# Patient Record
Sex: Male | Born: 1945 | Race: White | Hispanic: No | Marital: Single | Smoking: Former smoker
Health system: Southern US, Community
[De-identification: ages and names within clinical notes are randomized; demographics above are authoritative.]

## PROBLEM LIST (undated history)

## (undated) DIAGNOSIS — I509 Heart failure, unspecified: Secondary | ICD-10-CM

## (undated) DIAGNOSIS — I2510 Atherosclerotic heart disease of native coronary artery without angina pectoris: Secondary | ICD-10-CM

## (undated) DIAGNOSIS — N400 Enlarged prostate without lower urinary tract symptoms: Secondary | ICD-10-CM

## (undated) DIAGNOSIS — I219 Acute myocardial infarction, unspecified: Secondary | ICD-10-CM

## (undated) DIAGNOSIS — M1990 Unspecified osteoarthritis, unspecified site: Secondary | ICD-10-CM

## (undated) DIAGNOSIS — I77810 Thoracic aortic ectasia: Secondary | ICD-10-CM

## (undated) DIAGNOSIS — Z95828 Presence of other vascular implants and grafts: Secondary | ICD-10-CM

## (undated) DIAGNOSIS — J449 Chronic obstructive pulmonary disease, unspecified: Secondary | ICD-10-CM

## (undated) DIAGNOSIS — M109 Gout, unspecified: Secondary | ICD-10-CM

## (undated) DIAGNOSIS — I10 Essential (primary) hypertension: Secondary | ICD-10-CM

## (undated) DIAGNOSIS — I5042 Chronic combined systolic (congestive) and diastolic (congestive) heart failure: Secondary | ICD-10-CM

## (undated) HISTORY — PX: FRACTURE SURGERY: SHX138

## (undated) HISTORY — PX: HERNIA REPAIR: SHX51

## (undated) HISTORY — DX: Thoracic aortic ectasia: I77.810

## (undated) HISTORY — PX: OTHER SURGICAL HISTORY: SHX169

---

## 2014-07-20 ENCOUNTER — Observation Stay (HOSPITAL_COMMUNITY)
Admission: EM | Admit: 2014-07-20 | Discharge: 2014-07-24 | Disposition: A | Discharge status: Home / Self Care | Payer: Medicare HMO | Attending: Family Medicine | Admitting: Family Medicine

## 2014-07-20 ENCOUNTER — Emergency Department (HOSPITAL_COMMUNITY): Payer: Medicare HMO

## 2014-07-20 ENCOUNTER — Encounter (HOSPITAL_COMMUNITY): Payer: Self-pay | Admitting: *Deleted

## 2014-07-20 DIAGNOSIS — I10 Essential (primary) hypertension: Secondary | ICD-10-CM | POA: Insufficient documentation

## 2014-07-20 DIAGNOSIS — Z87891 Personal history of nicotine dependence: Secondary | ICD-10-CM | POA: Diagnosis not present

## 2014-07-20 DIAGNOSIS — I509 Heart failure, unspecified: Secondary | ICD-10-CM | POA: Insufficient documentation

## 2014-07-20 DIAGNOSIS — I252 Old myocardial infarction: Secondary | ICD-10-CM | POA: Insufficient documentation

## 2014-07-20 DIAGNOSIS — R42 Dizziness and giddiness: Secondary | ICD-10-CM | POA: Insufficient documentation

## 2014-07-20 DIAGNOSIS — I2510 Atherosclerotic heart disease of native coronary artery without angina pectoris: Secondary | ICD-10-CM | POA: Diagnosis not present

## 2014-07-20 DIAGNOSIS — Z79899 Other long term (current) drug therapy: Secondary | ICD-10-CM | POA: Insufficient documentation

## 2014-07-20 DIAGNOSIS — R509 Fever, unspecified: Secondary | ICD-10-CM

## 2014-07-20 DIAGNOSIS — H538 Other visual disturbances: Secondary | ICD-10-CM | POA: Insufficient documentation

## 2014-07-20 DIAGNOSIS — M1990 Unspecified osteoarthritis, unspecified site: Secondary | ICD-10-CM | POA: Diagnosis not present

## 2014-07-20 DIAGNOSIS — M542 Cervicalgia: Secondary | ICD-10-CM | POA: Insufficient documentation

## 2014-07-20 DIAGNOSIS — R110 Nausea: Secondary | ICD-10-CM | POA: Insufficient documentation

## 2014-07-20 DIAGNOSIS — R51 Headache: Secondary | ICD-10-CM | POA: Insufficient documentation

## 2014-07-20 DIAGNOSIS — Z880 Allergy status to penicillin: Secondary | ICD-10-CM | POA: Insufficient documentation

## 2014-07-20 DIAGNOSIS — Z7982 Long term (current) use of aspirin: Secondary | ICD-10-CM | POA: Insufficient documentation

## 2014-07-20 DIAGNOSIS — R739 Hyperglycemia, unspecified: Secondary | ICD-10-CM

## 2014-07-20 DIAGNOSIS — N179 Acute kidney failure, unspecified: Secondary | ICD-10-CM | POA: Diagnosis not present

## 2014-07-20 DIAGNOSIS — J441 Chronic obstructive pulmonary disease with (acute) exacerbation: Secondary | ICD-10-CM | POA: Insufficient documentation

## 2014-07-20 DIAGNOSIS — R079 Chest pain, unspecified: Principal | ICD-10-CM | POA: Insufficient documentation

## 2014-07-20 DIAGNOSIS — N170 Acute kidney failure with tubular necrosis: Secondary | ICD-10-CM

## 2014-07-20 HISTORY — DX: Heart failure, unspecified: I50.9

## 2014-07-20 HISTORY — DX: Unspecified osteoarthritis, unspecified site: M19.90

## 2014-07-20 HISTORY — DX: Atherosclerotic heart disease of native coronary artery without angina pectoris: I25.10

## 2014-07-20 HISTORY — DX: Acute myocardial infarction, unspecified: I21.9

## 2014-07-20 HISTORY — DX: Essential (primary) hypertension: I10

## 2014-07-20 HISTORY — DX: Presence of other vascular implants and grafts: Z95.828

## 2014-07-20 HISTORY — DX: Chronic obstructive pulmonary disease, unspecified: J44.9

## 2014-07-20 LAB — URINALYSIS, ROUTINE W REFLEX MICROSCOPIC
Bilirubin Urine: NEGATIVE
Glucose, UA: NEGATIVE mg/dL
Hgb urine dipstick: NEGATIVE
Ketones, ur: NEGATIVE mg/dL
LEUKOCYTES UA: NEGATIVE
Nitrite: NEGATIVE
PROTEIN: NEGATIVE mg/dL
Specific Gravity, Urine: 1.02 (ref 1.005–1.030)
UROBILINOGEN UA: 0.2 mg/dL (ref 0.0–1.0)
pH: 7 (ref 5.0–8.0)

## 2014-07-20 LAB — RAPID URINE DRUG SCREEN, HOSP PERFORMED
AMPHETAMINES: NOT DETECTED
BENZODIAZEPINES: NOT DETECTED
Barbiturates: NOT DETECTED
Cocaine: NOT DETECTED
Opiates: NOT DETECTED
Tetrahydrocannabinol: NOT DETECTED

## 2014-07-20 LAB — COMPREHENSIVE METABOLIC PANEL
ALT: 29 U/L (ref 17–63)
ANION GAP: 9 (ref 5–15)
AST: 46 U/L — ABNORMAL HIGH (ref 15–41)
Albumin: 3.9 g/dL (ref 3.5–5.0)
Alkaline Phosphatase: 58 U/L (ref 38–126)
BUN: 26 mg/dL — AB (ref 6–20)
CALCIUM: 8.7 mg/dL — AB (ref 8.9–10.3)
CO2: 23 mmol/L (ref 22–32)
Chloride: 105 mmol/L (ref 101–111)
Creatinine, Ser: 1.63 mg/dL — ABNORMAL HIGH (ref 0.61–1.24)
GFR calc non Af Amer: 42 mL/min — ABNORMAL LOW (ref >60)
GFR, EST AFRICAN AMERICAN: 49 mL/min — AB (ref >60)
GLUCOSE: 115 mg/dL — AB (ref 65–99)
POTASSIUM: 3.9 mmol/L (ref 3.5–5.1)
Sodium: 137 mmol/L (ref 135–145)
Total Bilirubin: 0.6 mg/dL (ref 0.3–1.2)
Total Protein: 7.3 g/dL (ref 6.5–8.1)

## 2014-07-20 LAB — CBC WITH DIFFERENTIAL/PLATELET
BASOS ABS: 0 10*3/uL (ref 0.0–0.1)
Basophils Relative: 0 % (ref 0–1)
Eosinophils Absolute: 0 10*3/uL (ref 0.0–0.7)
Eosinophils Relative: 1 % (ref 0–5)
HCT: 37.4 % — ABNORMAL LOW (ref 39.0–52.0)
Hemoglobin: 13 g/dL (ref 13.0–17.0)
LYMPHS PCT: 20 % (ref 12–46)
Lymphs Abs: 0.4 10*3/uL — ABNORMAL LOW (ref 0.7–4.0)
MCH: 29.8 pg (ref 26.0–34.0)
MCHC: 34.8 g/dL (ref 30.0–36.0)
MCV: 85.8 fL (ref 78.0–100.0)
MONO ABS: 0.2 10*3/uL (ref 0.1–1.0)
Monocytes Relative: 10 % (ref 3–12)
NEUTROS PCT: 69 % (ref 43–77)
Neutro Abs: 1.5 10*3/uL — ABNORMAL LOW (ref 1.7–7.7)
PLATELETS: 123 10*3/uL — AB (ref 150–400)
RBC: 4.36 MIL/uL (ref 4.22–5.81)
RDW: 12.9 % (ref 11.5–15.5)
WBC: 2.2 10*3/uL — AB (ref 4.0–10.5)

## 2014-07-20 LAB — TROPONIN I

## 2014-07-20 MED ORDER — BETAMETHASONE VALERATE 0.1 % EX LOTN
TOPICAL_LOTION | Freq: Two times a day (BID) | CUTANEOUS | Status: DC
  Administered 2014-07-20 – 2014-07-23 (×6): via TOPICAL
  Administered 2014-07-23: 1 via TOPICAL
  Administered 2014-07-24: 12:00:00 via TOPICAL
  Filled 2014-07-20: qty 60

## 2014-07-20 MED ORDER — ALBUTEROL SULFATE (2.5 MG/3ML) 0.083% IN NEBU
2.5000 mg | INHALATION_SOLUTION | Freq: Four times a day (QID) | RESPIRATORY_TRACT | Status: DC | PRN
  Administered 2014-07-21: 2.5 mg via RESPIRATORY_TRACT
  Filled 2014-07-20: qty 3

## 2014-07-20 MED ORDER — TIOTROPIUM BROMIDE MONOHYDRATE 18 MCG IN CAPS
18.0000 ug | ORAL_CAPSULE | Freq: Every day | RESPIRATORY_TRACT | Status: DC
  Administered 2014-07-21 – 2014-07-24 (×4): 18 ug via RESPIRATORY_TRACT
  Filled 2014-07-20 (×2): qty 5

## 2014-07-20 MED ORDER — NITROGLYCERIN 0.4 MG SL SUBL
0.4000 mg | SUBLINGUAL_TABLET | Freq: Once | SUBLINGUAL | Status: AC
  Administered 2014-07-20: 0.4 mg via SUBLINGUAL
  Filled 2014-07-20: qty 1

## 2014-07-20 NOTE — H&P (Signed)
Phillip Pace is an 69 y.o. male.    [place] (pcp)  Chief Complaint: chest pain HPI: 69 yo male with CAD, CHF (EF ?,  Pt denies this diagnosis), apparently c/o chills, subjective fever for the past 2-3 days.  Denies cough, dysuria, hematuria, diarrhea.  Pt admits to tick bite.  Pt has been cutting brush.  Pt admits to chest pain intermittently.  But this is not new.  Chest pain usually in the middle, with radiation to the left arm.  Usually lasting about 2-3 minutes.  Pt states that last episode of chest pain was last nite.  Pt will be admitted for subjective fever and chest pain and sob.    Past Medical History  Diagnosis Date  . MI (myocardial infarction)     x 5  . Presence of stent in artery     13 stents  . CHF (congestive heart failure)   . Arthritis   . COPD (chronic obstructive pulmonary disease)   . Coronary artery disease   . Hypertension     Past Surgical History  Procedure Laterality Date  . Hernia repair    . Fracture surgery      collar bone  . Stents 13      Family History  Problem Relation Age of Onset  . Cancer - Colon Mother   . CAD Mother    Social History:  reports that he quit smoking about 21 years ago. He does not have any smokeless tobacco history on file. He reports that he does not drink alcohol or use illicit drugs.  Allergies:  Allergies  Allergen Reactions  . Penicillins Shortness Of Breath, Itching and Swelling  . Shellfish Allergy Itching and Swelling   Medications reviewed   Results for orders placed or performed during the hospital encounter of 07/20/14 (from the past 48 hour(s))  CBC with Differential     Status: Abnormal   Collection Time: 07/20/14  8:40 PM  Result Value Ref Range   WBC 2.2 (L) 4.0 - 10.5 K/uL   RBC 4.36 4.22 - 5.81 MIL/uL   Hemoglobin 13.0 13.0 - 17.0 g/dL   HCT 37.4 (L) 39.0 - 52.0 %   MCV 85.8 78.0 - 100.0 fL   MCH 29.8 26.0 - 34.0 pg   MCHC 34.8 30.0 - 36.0 g/dL   RDW 12.9 11.5 - 15.5 %   Platelets 123  (L) 150 - 400 K/uL   Neutrophils Relative % 69 43 - 77 %   Neutro Abs 1.5 (L) 1.7 - 7.7 K/uL   Lymphocytes Relative 20 12 - 46 %   Lymphs Abs 0.4 (L) 0.7 - 4.0 K/uL   Monocytes Relative 10 3 - 12 %   Monocytes Absolute 0.2 0.1 - 1.0 K/uL   Eosinophils Relative 1 0 - 5 %   Eosinophils Absolute 0.0 0.0 - 0.7 K/uL   Basophils Relative 0 0 - 1 %   Basophils Absolute 0.0 0.0 - 0.1 K/uL  Troponin I     Status: None   Collection Time: 07/20/14  8:40 PM  Result Value Ref Range   Troponin I <0.03 <0.031 ng/mL    Comment:        NO INDICATION OF MYOCARDIAL INJURY.   Comprehensive metabolic panel     Status: Abnormal   Collection Time: 07/20/14  8:40 PM  Result Value Ref Range   Sodium 137 135 - 145 mmol/L   Potassium 3.9 3.5 - 5.1 mmol/L   Chloride 105 101 -  111 mmol/L   CO2 23 22 - 32 mmol/L   Glucose, Bld 115 (H) 65 - 99 mg/dL   BUN 26 (H) 6 - 20 mg/dL   Creatinine, Ser 1.63 (H) 0.61 - 1.24 mg/dL   Calcium 8.7 (L) 8.9 - 10.3 mg/dL   Total Protein 7.3 6.5 - 8.1 g/dL   Albumin 3.9 3.5 - 5.0 g/dL   AST 46 (H) 15 - 41 U/L   ALT 29 17 - 63 U/L   Alkaline Phosphatase 58 38 - 126 U/L   Total Bilirubin 0.6 0.3 - 1.2 mg/dL   GFR calc non Af Amer 42 (L) >60 mL/min   GFR calc Af Amer 49 (L) >60 mL/min    Comment: (NOTE) The eGFR has been calculated using the CKD EPI equation. This calculation has not been validated in all clinical situations. eGFR's persistently <60 mL/min signify possible Chronic Kidney Disease.    Anion gap 9 5 - 15  Urine rapid drug screen (hosp performed)     Status: None   Collection Time: 07/20/14  9:26 PM  Result Value Ref Range   Opiates NONE DETECTED NONE DETECTED   Cocaine NONE DETECTED NONE DETECTED   Benzodiazepines NONE DETECTED NONE DETECTED   Amphetamines NONE DETECTED NONE DETECTED   Tetrahydrocannabinol NONE DETECTED NONE DETECTED   Barbiturates NONE DETECTED NONE DETECTED    Comment:        DRUG SCREEN FOR MEDICAL PURPOSES ONLY.  IF CONFIRMATION  IS NEEDED FOR ANY PURPOSE, NOTIFY LAB WITHIN 5 DAYS.        LOWEST DETECTABLE LIMITS FOR URINE DRUG SCREEN Drug Class       Cutoff (ng/mL) Amphetamine      1000 Barbiturate      200 Benzodiazepine   443 Tricyclics       154 Opiates          300 Cocaine          300 THC              50   Urinalysis, Routine w reflex microscopic (not at [facility])     Status: None   Collection Time: 07/20/14  9:26 PM  Result Value Ref Range   Color, Urine YELLOW YELLOW   APPearance CLEAR CLEAR   Specific Gravity, Urine 1.020 1.005 - 1.030   pH 7.0 5.0 - 8.0   Glucose, UA NEGATIVE NEGATIVE mg/dL   Hgb urine dipstick NEGATIVE NEGATIVE   Bilirubin Urine NEGATIVE NEGATIVE   Ketones, ur NEGATIVE NEGATIVE mg/dL   Protein, ur NEGATIVE NEGATIVE mg/dL   Urobilinogen, UA 0.2 0.0 - 1.0 mg/dL   Nitrite NEGATIVE NEGATIVE   Leukocytes, UA NEGATIVE NEGATIVE    Comment: MICROSCOPIC NOT DONE ON URINES WITH NEGATIVE PROTEIN, BLOOD, LEUKOCYTES, NITRITE, OR GLUCOSE <1000 mg/dL.   Dg Chest 2 View  07/20/2014   CLINICAL DATA:  Left-sided chest pain, shortness of breath, and chills.  EXAM: CHEST  2 VIEW  COMPARISON:  None.  FINDINGS: Slight interstitial pattern to the lung bases, suggesting mild fibrosis or chronic bronchitic change. No evidence of focal consolidation or airspace disease. No blunting of costophrenic angles. No pneumothorax. Normal heart size and pulmonary vascularity. Mediastinal contours are intact.  IMPRESSION: No active cardiopulmonary disease.   Electronically Signed   By: Lucienne Capers M.D.   On: 07/20/2014 21:13    Review of Systems  Constitutional: Positive for fever and chills. Negative for weight loss, malaise/fatigue and diaphoresis.  HENT: Negative.   Eyes: Negative.  Respiratory: Positive for shortness of breath. Negative for cough, hemoptysis, sputum production and wheezing.   Cardiovascular: Positive for chest pain. Negative for palpitations, orthopnea, claudication, leg swelling and  PND.  Gastrointestinal: Negative for heartburn, nausea, vomiting, abdominal pain, diarrhea, constipation, blood in stool and melena.  Genitourinary: Negative for dysuria, urgency, frequency, hematuria and flank pain.  Musculoskeletal: Negative for myalgias, back pain, joint pain, falls and neck pain.  Skin: Negative.   Neurological: Negative.  Negative for weakness.  Endo/Heme/Allergies: Negative.   Psychiatric/Behavioral: Negative.     Blood pressure 121/57, pulse 82, temperature 98.5 F (36.9 C), temperature source Oral, resp. rate 24, height 6' 2" (1.88 m), weight 83.915 kg (185 lb), SpO2 96 %. Physical Exam  Constitutional: He is oriented to person, place, and time. He appears well-developed and well-nourished.  HENT:  Head: Normocephalic and atraumatic.  Mouth/Throat: No oropharyngeal exudate.  Eyes: Conjunctivae and EOM are normal. Pupils are equal, round, and reactive to light. No scleral icterus.  Neck: Normal range of motion. Neck supple. No JVD present. No tracheal deviation present. No thyromegaly present.  Cardiovascular: Normal rate and regular rhythm.  Exam reveals no gallop and no friction rub.   No murmur heard. Respiratory: Effort normal and breath sounds normal. No respiratory distress. He has no wheezes. He has no rales.  GI: Soft. Bowel sounds are normal. He exhibits no distension. There is no tenderness. There is no rebound and no guarding.  Musculoskeletal: Normal range of motion. He exhibits no edema or tenderness.  Lymphadenopathy:    He has no cervical adenopathy.  Neurological: He is alert and oriented to person, place, and time. He has normal reflexes. He displays normal reflexes. No cranial nerve deficit. He exhibits normal muscle tone. Coordination normal.  Skin: Skin is warm and dry. No rash noted. No erythema. No pallor.  Psychiatric: He has a normal mood and affect. His behavior is normal. Judgment and thought content normal.      Assessment/Plan Fever Check lyme, and check ehlichiosis titer Blood culture x2 Check esr  ARF Check ua  Check  Urine sodium, urine creatinine, urine eosinophils Hydrate with ns iv  Chest pain Tele Trop i q6h x3 NPO after md Nuclear stress test  Hyperglycemia Check hga1c  DVt prophylaxis:  Scd, lovenox   Jani Gravel 07/20/2014, 10:49 PM

## 2014-07-20 NOTE — ED Notes (Signed)
Pt. Reports intermittent dizziness and chest pain. Pt. Reports that the chest pain gets worse when he lays on his left side. Pt. Reports shortness of breath with ambulation. No distress noted.

## 2014-07-20 NOTE — ED Notes (Addendum)
Pt c/o chest pain. When asked how long he has been having chest pain, he states "oh, it happens all the time." Pt also c/o chills, generalized bodyaches and fever x 2 days and dizziness x 2 months. SOB x 2 years. Pt has not seen PCP. Pt has multiple complaints.

## 2014-07-20 NOTE — ED Provider Notes (Signed)
CSN: [id number]     Arrival date & time 07/20/14  1950 History  This chart was scribed for Davonna Belling, MD by Eustaquio Maize, ED Scribe. This patient was seen in room APA14/[facility]14 and the patient's care was started at 8:15 PM.  Chief Complaint  Patient presents with  . Chest Pain   The history is provided by the patient. No language interpreter was used.     HPI Comments: Phillip Pace is a 69 y.o. male with hx CAD s/p stents, multiple MIs, CHF, COPD, and HTN who presents to the Emergency Department complaining of intermittent chest pain that has been on going for some time. He also complains of increasing shortness of breath, chills, fever of 101.5, headache, blurry vision, and dizziness. He has been having fever x 1 week. Pt states that he gets dizzy and short of breath upon walking. Pt also complains of left sided neck pain and left arm pain that occurs while having chest pain. Pt also gets nauseous when the chest pain occurs. Pt saw Dr. Cindie Laroche approximately 2 weeks ago and was prescribed nitroglycerin. He has follow up appointment in 3 weeks. Denies cough, rash, dysuria, hematuria, vomiting, diarrhea, speech difficulty, numbness or weakness in extremities, or any other symptoms. Pt is former smoker who quit approximately 25 years ago.    Past Medical History  Diagnosis Date  . MI (myocardial infarction)     x 5  . Presence of stent in artery     13 stents  . CHF (congestive heart failure)   . Arthritis   . COPD (chronic obstructive pulmonary disease)   . Coronary artery disease   . Hypertension    Past Surgical History  Procedure Laterality Date  . Hernia repair    . Fracture surgery      collar bone  . Stents 13     Family History  Problem Relation Age of Onset  . Cancer - Colon Mother   . CAD Mother    History  Substance Use Topics  . Smoking status: Former Smoker    Quit date: 02/02/1993  . Smokeless tobacco: Not on file  . Alcohol Use: No    Review of  Systems  Constitutional: Positive for fever and chills.  HENT: Negative for congestion, rhinorrhea and sore throat.   Eyes: Positive for visual disturbance.  Respiratory: Positive for shortness of breath. Negative for cough.   Cardiovascular: Positive for chest pain.  Gastrointestinal: Positive for nausea. Negative for vomiting and diarrhea.  Genitourinary: Negative for dysuria and hematuria.  Musculoskeletal: Positive for arthralgias (Left arm. ) and neck pain.  Skin: Negative for rash.  Neurological: Positive for dizziness and headaches. Negative for speech difficulty, weakness and numbness.      Allergies  Penicillins and Shellfish allergy  Home Medications   Prior to Admission medications   Medication Sig Start Date End Date Taking? Authorizing Provider  aspirin 325 MG EC tablet Take 325 mg by mouth every evening.   Yes Historical Provider, MD  Aspirin-Caffeine (ANACIN) 400-32 MG TABS Take 1-2 tablets by mouth daily as needed (for pain).   Yes Historical Provider, MD  fenofibrate micronized (LOFIBRA) 134 MG capsule Take 134 mg by mouth every evening.   Yes Historical Provider, MD  ibuprofen (ADVIL,MOTRIN) 600 MG tablet Take 600 mg by mouth every 6 (six) hours as needed for mild pain or moderate pain.   Yes Historical Provider, MD  lisinopril (PRINIVIL,ZESTRIL) 5 MG tablet Take 5 mg by mouth every evening.  Yes Historical Provider, MD  niacin (SLO-NIACIN) 500 MG tablet Take 500 mg by mouth at bedtime.   Yes Historical Provider, MD  NITROSTAT 0.4 MG SL tablet Take one tablet by mouth as needed for chest pain. Repeat in 5 minutes if no relief for up to 3 total doses as needed for chest pain. 07/06/14  Yes Historical Provider, MD   Triage Vitals: BP 149/80 mmHg  Pulse 85  Temp(Src) 99.5 F (37.5 C) (Oral)  Resp 20  Ht 6\' 2"  (1.88 m)  Wt 185 lb (83.915 kg)  BMI 23.74 kg/m2  SpO2 100%   Physical Exam  Constitutional: He is oriented to person, place, and time. He appears  well-developed and well-nourished. No distress.  HENT:  Head: Normocephalic and atraumatic.  Eyes: Conjunctivae and EOM are normal.  Neck: Neck supple. No JVD present. No tracheal deviation present.  Cardiovascular: Normal rate, regular rhythm and normal heart sounds.   Pulmonary/Chest: Effort normal. No respiratory distress. He has rales.  Few scattered crackles on right mid lung field.   Musculoskeletal: Normal range of motion. He exhibits no edema.  Neurological: He is alert and oriented to person, place, and time.  Skin: Skin is warm and dry.  Psychiatric: He has a normal mood and affect. His behavior is normal.  Nursing note and vitals reviewed.   ED Course  Procedures (including critical care time)  DIAGNOSTIC STUDIES: Oxygen Saturation is 100% on RA, normal by my interpretation.    COORDINATION OF CARE: 8:25 PM-Discussed treatment plan which includes CXR, UA, CBC, Troponin, CMP, Drug screen with pt at bedside and pt agreed to plan.   Labs Review Labs Reviewed  CBC WITH DIFFERENTIAL/PLATELET - Abnormal; Notable for the following:    WBC 2.2 (*)    HCT 37.4 (*)    Platelets 123 (*)    Neutro Abs 1.5 (*)    Lymphs Abs 0.4 (*)    All other components within normal limits  COMPREHENSIVE METABOLIC PANEL - Abnormal; Notable for the following:    Glucose, Bld 115 (*)    BUN 26 (*)    Creatinine, Ser 1.63 (*)    Calcium 8.7 (*)    AST 46 (*)    GFR calc non Af Amer 42 (*)    GFR calc Af Amer 49 (*)    All other components within normal limits  TROPONIN I  URINE RAPID DRUG SCREEN, HOSP PERFORMED  URINALYSIS, ROUTINE W REFLEX MICROSCOPIC (NOT AT [facility])  CBC  COMPREHENSIVE METABOLIC PANEL    Imaging Review Dg Chest 2 View  07/20/2014   CLINICAL DATA:  Left-sided chest pain, shortness of breath, and chills.  EXAM: CHEST  2 VIEW  COMPARISON:  None.  FINDINGS: Slight interstitial pattern to the lung bases, suggesting mild fibrosis or chronic bronchitic change. No evidence of  focal consolidation or airspace disease. No blunting of costophrenic angles. No pneumothorax. Normal heart size and pulmonary vascularity. Mediastinal contours are intact.  IMPRESSION: No active cardiopulmonary disease.   Electronically Signed   By: Lucienne Capers M.D.   On: 07/20/2014 21:13     EKG Interpretation   Date/Time:  Friday July 20 2014 19:59:10 EDT Ventricular Rate:  89 PR Interval:  162 QRS Duration: 94 QT Interval:  366 QTC Calculation: 445 R Axis:   9 Text Interpretation:  Normal sinus rhythm Nonspecific ST abnormality  Abnormal ECG No old tracing to compare Confirmed by Alvino Chapel  MD, Larina Lieurance  [id number]) on 07/20/2014 8:08:07 PM  MDM   Final diagnoses:  Chest pain, unspecified chest pain type  Acute renal failure, unspecified acute renal failure type   patient presents with episodes of chest pain shortness of breath. Come both with exertion and at rest. It is not necessarily associated with the exertion. States he has had previous coronary disease did set feels somewhat like this. Also has had fevers and chills. She's had occasional cough. No dysuria. He just started to see a new doctor after moving to town from [facility] EKG has nonspecific changes but on all these are new or old. Pain feels somewhat better in the ER. X-ray reassuring although were somewhat focal lung findings on exam. Urine does not show infection. Will admit to internal medicine.  I personally performed the services described in this documentation, which was scribed in my presence. The recorded information has been reviewed and is accurate.       Davonna Belling, MD 07/20/14 [phone]

## 2014-07-21 ENCOUNTER — Inpatient Hospital Stay (HOSPITAL_BASED_OUTPATIENT_CLINIC_OR_DEPARTMENT_OTHER): Payer: Medicare HMO

## 2014-07-21 ENCOUNTER — Encounter (HOSPITAL_COMMUNITY): Payer: Self-pay | Admitting: *Deleted

## 2014-07-21 DIAGNOSIS — H538 Other visual disturbances: Secondary | ICD-10-CM | POA: Diagnosis not present

## 2014-07-21 DIAGNOSIS — N179 Acute kidney failure, unspecified: Secondary | ICD-10-CM | POA: Diagnosis not present

## 2014-07-21 DIAGNOSIS — R509 Fever, unspecified: Secondary | ICD-10-CM | POA: Diagnosis not present

## 2014-07-21 DIAGNOSIS — R079 Chest pain, unspecified: Secondary | ICD-10-CM

## 2014-07-21 LAB — COMPREHENSIVE METABOLIC PANEL
ALBUMIN: 3.6 g/dL (ref 3.5–5.0)
ALT: 28 U/L (ref 17–63)
ANION GAP: 8 (ref 5–15)
AST: 38 U/L (ref 15–41)
Alkaline Phosphatase: 55 U/L (ref 38–126)
BUN: 26 mg/dL — ABNORMAL HIGH (ref 6–20)
CHLORIDE: 105 mmol/L (ref 101–111)
CO2: 24 mmol/L (ref 22–32)
Calcium: 8.5 mg/dL — ABNORMAL LOW (ref 8.9–10.3)
Creatinine, Ser: 1.5 mg/dL — ABNORMAL HIGH (ref 0.61–1.24)
GFR calc Af Amer: 54 mL/min — ABNORMAL LOW (ref >60)
GFR calc non Af Amer: 46 mL/min — ABNORMAL LOW (ref >60)
Glucose, Bld: 106 mg/dL — ABNORMAL HIGH (ref 65–99)
Potassium: 4.1 mmol/L (ref 3.5–5.1)
SODIUM: 137 mmol/L (ref 135–145)
Total Bilirubin: 0.8 mg/dL (ref 0.3–1.2)
Total Protein: 6.5 g/dL (ref 6.5–8.1)

## 2014-07-21 LAB — CBC
HCT: 37.2 % — ABNORMAL LOW (ref 39.0–52.0)
Hemoglobin: 12.5 g/dL — ABNORMAL LOW (ref 13.0–17.0)
MCH: 28.9 pg (ref 26.0–34.0)
MCHC: 33.6 g/dL (ref 30.0–36.0)
MCV: 86.1 fL (ref 78.0–100.0)
PLATELETS: 113 10*3/uL — AB (ref 150–400)
RBC: 4.32 MIL/uL (ref 4.22–5.81)
RDW: 12.9 % (ref 11.5–15.5)
WBC: 2.2 10*3/uL — AB (ref 4.0–10.5)

## 2014-07-21 LAB — LIPID PANEL
CHOLESTEROL: 178 mg/dL (ref 0–200)
HDL: 33 mg/dL — ABNORMAL LOW (ref >40)
LDL CALC: 113 mg/dL — AB (ref 0–99)
TRIGLYCERIDES: 158 mg/dL — AB (ref <150)
Total CHOL/HDL Ratio: 5.4 RATIO
VLDL: 32 mg/dL (ref 0–40)

## 2014-07-21 LAB — TROPONIN I

## 2014-07-21 MED ORDER — ACETAMINOPHEN 325 MG PO TABS
650.0000 mg | ORAL_TABLET | Freq: Four times a day (QID) | ORAL | Status: DC | PRN
  Administered 2014-07-21 (×3): 650 mg via ORAL
  Filled 2014-07-21 (×3): qty 2

## 2014-07-21 MED ORDER — FENOFIBRATE 54 MG PO TABS
54.0000 mg | ORAL_TABLET | Freq: Every day | ORAL | Status: DC
  Administered 2014-07-21 – 2014-07-24 (×4): 54 mg via ORAL
  Filled 2014-07-21 (×5): qty 1

## 2014-07-21 MED ORDER — NIACIN ER 250 MG PO CPCR
ORAL_CAPSULE | ORAL | Status: AC
  Filled 2014-07-21: qty 2

## 2014-07-21 MED ORDER — LISINOPRIL 5 MG PO TABS
5.0000 mg | ORAL_TABLET | Freq: Every evening | ORAL | Status: DC
  Administered 2014-07-21 – 2014-07-24 (×4): 5 mg via ORAL
  Filled 2014-07-21 (×4): qty 1

## 2014-07-21 MED ORDER — ENOXAPARIN SODIUM 40 MG/0.4ML ~~LOC~~ SOLN
40.0000 mg | SUBCUTANEOUS | Status: DC
  Administered 2014-07-21 – 2014-07-22 (×2): 40 mg via SUBCUTANEOUS
  Filled 2014-07-21 (×3): qty 0.4

## 2014-07-21 MED ORDER — DOXYCYCLINE HYCLATE 100 MG PO TABS
100.0000 mg | ORAL_TABLET | Freq: Two times a day (BID) | ORAL | Status: DC
  Administered 2014-07-21 – 2014-07-24 (×8): 100 mg via ORAL
  Filled 2014-07-21 (×8): qty 1

## 2014-07-21 MED ORDER — ACETAMINOPHEN 650 MG RE SUPP: 650.0000 mg | Freq: Four times a day (QID) | RECTAL | Status: DC | PRN

## 2014-07-21 MED ORDER — NIACIN ER 500 MG PO TBCR
500.0000 mg | EXTENDED_RELEASE_TABLET | Freq: Every day | ORAL | Status: DC
  Administered 2014-07-21: 500 mg via ORAL
  Filled 2014-07-21 (×3): qty 1

## 2014-07-21 MED ORDER — SODIUM CHLORIDE 0.9 % IV SOLN
INTRAVENOUS | Status: AC
  Administered 2014-07-21: 01:00:00 via INTRAVENOUS

## 2014-07-21 MED ORDER — BETAMETHASONE VALERATE 0.1 % EX LOTN
TOPICAL_LOTION | CUTANEOUS | Status: AC
  Filled 2014-07-21: qty 60

## 2014-07-21 MED ORDER — SODIUM CHLORIDE 0.9 % IJ SOLN
3.0000 mL | Freq: Two times a day (BID) | INTRAMUSCULAR | Status: DC
  Administered 2014-07-21 – 2014-07-24 (×6): 3 mL via INTRAVENOUS
  Administered 2014-07-24: 10 mL via INTRAVENOUS

## 2014-07-21 MED ORDER — ASPIRIN EC 325 MG PO TBEC
325.0000 mg | DELAYED_RELEASE_TABLET | Freq: Every evening | ORAL | Status: DC
  Administered 2014-07-21 – 2014-07-24 (×4): 325 mg via ORAL
  Filled 2014-07-21 (×4): qty 1

## 2014-07-21 MED ORDER — NITROGLYCERIN 0.4 MG SL SUBL
0.4000 mg | SUBLINGUAL_TABLET | SUBLINGUAL | Status: DC | PRN
  Administered 2014-07-21: 0.4 mg via SUBLINGUAL
  Filled 2014-07-21: qty 1

## 2014-07-21 MED ORDER — NIACIN ER 250 MG PO CPCR
500.0000 mg | ORAL_CAPSULE | Freq: Every day | ORAL | Status: DC
  Administered 2014-07-21 – 2014-07-23 (×3): 500 mg via ORAL
  Filled 2014-07-21 (×4): qty 2

## 2014-07-21 NOTE — Progress Notes (Signed)
Subjective:   Objective: Vital signs in last 24 hours: Temp:  [98.2 F (36.8 C)-99.5 F (37.5 C)] 98.4 F (36.9 C) (06/18 1259) Pulse Rate:  [71-86] 86 (06/18 1259) Resp:  [17-24] 20 (06/18 1259) BP: (112-149)/(55-84) 124/84 mmHg (06/18 1259) SpO2:  [95 %-100 %] 98 % (06/18 1259) Weight:  [83.9 kg (184 lb 15.5 oz)-83.915 kg (185 lb)] 83.9 kg (184 lb 15.5 oz) (06/18 0500) Weight change:  Last BM Date: 07/20/14  Intake/Output from previous day: 06/17 0701 - 06/18 0700 In: 245 [I.V.:245] Out: 500 [Urine:500] Intake/Output this shift: Total I/O In: 480 [P.O.:480] Out: 525 [Urine:525]  Physical Exam:    Recent Labs  07/20/14 2040 07/21/14 0618  WBC 2.2* 2.2*  HGB 13.0 12.5*  HCT 37.4* 37.2*  PLT 123* 113*   BMET  Recent Labs  07/20/14 2040 07/21/14 0618  NA 137 137  K 3.9 4.1  CL 105 105  CO2 23 24  GLUCOSE 115* 106*  BUN 26* 26*  CREATININE 1.63* 1.50*  CALCIUM 8.7* 8.5*    Studies/Results: Dg Chest 2 View  07/20/2014   CLINICAL DATA:  Left-sided chest pain, shortness of breath, and chills.  EXAM: CHEST  2 VIEW  COMPARISON:  None.  FINDINGS: Slight interstitial pattern to the lung bases, suggesting mild fibrosis or chronic bronchitic change. No evidence of focal consolidation or airspace disease. No blunting of costophrenic angles. No pneumothorax. Normal heart size and pulmonary vascularity. Mediastinal contours are intact.  IMPRESSION: No active cardiopulmonary disease.   Electronically Signed   By: Lucienne Capers M.D.   On: 07/20/2014 21:13    Medications:  . aspirin  325 mg Oral QPM  . betamethasone valerate lotion   Topical BID  . doxycycline  100 mg Oral Q12H  . enoxaparin (LOVENOX) injection  40 mg Subcutaneous Q24H  . fenofibrate  54 mg Oral Daily  . lisinopril  5 mg Oral QPM  . niacin  500 mg Oral QHS  . sodium chloride  3 mL Intravenous Q12H  . tiotropium  18 mcg Inhalation Daily        Assessment/Plan:    LOS: 1 day    Tami Barren G 07/21/2014, 3:15 PM

## 2014-07-21 NOTE — Progress Notes (Signed)
  Echocardiogram 2D Echocardiogram has been performed.  Lysle Rubens 07/21/2014, 2:59 PM

## 2014-07-21 NOTE — Progress Notes (Signed)
Subjective: The patient is alert and oriented and free of pain. He does have prior history of coronary artery disease with previous MI and multiple stent placement. 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 He was placed on IV Vibramycin and currently is afebrile. He was given nitroglycerin which did relieve his chest pain. His troponins have been normal  Objective: Vital signs in last 24 hours: Temp:  [98.2 F (36.8 C)-99.5 F (37.5 C)] 98.4 F (36.9 C) (06/18 1259) Pulse Rate:  [71-86] 86 (06/18 1259) Resp:  [17-24] 20 (06/18 1259) BP: (112-149)/(55-84) 124/84 mmHg (06/18 1259) SpO2:  [95 %-100 %] 98 % (06/18 1259) Weight:  [83.9 kg (184 lb 15.5 oz)-83.915 kg (185 lb)] 83.9 kg (184 lb 15.5 oz) (06/18 0500) Weight change:  Last BM Date: 07/20/14  Intake/Output from previous day: 06/17 0701 - 06/18 0700 In: 245 [I.V.:245] Out: 500 [Urine:500] Intake/Output this shift: Total I/O In: 480 [P.O.:480] Out: 525 [Urine:525]  Physical Exam: General appearance patient is alert and oriented  HEENT negative  Neck supple no JVD or thyroid abnormalities  Lungs clear to P&A  Heart regular rhythm no murmurs  Abdomen no palpable organs or masses  Skin warm and dry no rash noted   Recent Labs  07/20/14 2040 07/21/14 0618  WBC 2.2* 2.2*  HGB 13.0 12.5*  HCT 37.4* 37.2*  PLT 123* 113*   BMET  Recent Labs  07/20/14 2040 07/21/14 0618  NA 137 137  K 3.9 4.1  CL 105 105  CO2 23 24  GLUCOSE 115* 106*  BUN 26* 26*  CREATININE 1.63* 1.50*  CALCIUM 8.7* 8.5*    Studies/Results: Dg Chest 2 View  07/20/2014   CLINICAL DATA:  Left-sided chest pain, shortness of breath, and chills.  EXAM: CHEST  2 VIEW  COMPARISON:  None.  FINDINGS: Slight interstitial pattern to the lung bases, suggesting mild fibrosis or chronic bronchitic change. No evidence of focal consolidation or airspace disease. No blunting of costophrenic angles. No pneumothorax. Normal heart size and pulmonary vascularity.  Mediastinal contours are intact.  IMPRESSION: No active cardiopulmonary disease.   Electronically Signed   By: Lucienne Capers M.D.   On: 07/20/2014 21:13    Medications:  . aspirin  325 mg Oral QPM  . betamethasone valerate lotion   Topical BID  . doxycycline  100 mg Oral Q12H  . enoxaparin (LOVENOX) injection  40 mg Subcutaneous Q24H  . fenofibrate  54 mg Oral Daily  . lisinopril  5 mg Oral QPM  . niacin  500 mg Oral QHS  . sodium chloride  3 mL Intravenous Q12H  . tiotropium  18 mcg Inhalation Daily        Assessment/Plan: 1. Patient had multiple tick bites and fever awaiting cultures to be completed-continue Vibramycin IV  2. Chest pain-history of coronary artery disease with multiple stent placement-plan to continue to monitor troponin will repeat EKG-we'll continue when necessary Nitrostat and daily aspirin 325 mg   LOS: 1 day   Neetu Carrozza G 07/21/2014, 3:24 PM

## 2014-07-22 DIAGNOSIS — N179 Acute kidney failure, unspecified: Secondary | ICD-10-CM | POA: Diagnosis not present

## 2014-07-22 DIAGNOSIS — R509 Fever, unspecified: Secondary | ICD-10-CM | POA: Diagnosis not present

## 2014-07-22 DIAGNOSIS — R079 Chest pain, unspecified: Secondary | ICD-10-CM | POA: Diagnosis not present

## 2014-07-22 DIAGNOSIS — H538 Other visual disturbances: Secondary | ICD-10-CM | POA: Diagnosis not present

## 2014-07-22 NOTE — Progress Notes (Signed)
Subjective: The patient is alert and oriented free of pain. He did have episode of chest pain last evening which responded to nitroglycerin. He is afebrile. He does have a history of coronary artery disease with previous MI and multiple stent placements. Because of multiple tick bites and low-grade fever he was started on IV Vibramycin and is afebrile. His troponins remain normal  Objective: Vital signs in last 24 hours: Temp:  [98.1 F (36.7 C)-98.6 F (37 C)] 98.1 F (36.7 C) (06/19 0635) Pulse Rate:  [67-86] 77 (06/19 0635) Resp:  [14-20] 20 (06/19 0635) BP: (124-147)/(68-84) 140/68 mmHg (06/19 0635) SpO2:  [95 %-99 %] 96 % (06/19 0635) Weight:  [85.594 kg (188 lb 11.2 oz)] 85.594 kg (188 lb 11.2 oz) (06/19 0635) Weight change: 1.678 kg (3 lb 11.2 oz) Last BM Date: 07/20/14  Intake/Output from previous day: 06/18 0701 - 06/19 0700 In: 720 [P.O.:720] Out: 1825 [Urine:1825] Intake/Output this shift: Total I/O In: -  Out: 900 [Urine:900]  Physical Exam: Gen. appearance the patient is alert and oriented  H EENT negative  Neck supple no JVD or thyroid abnormalities  Lungs clear to P&A  Heart regular rhythm no murmurs  Abdomen no palpable organs or masses  Skin warm and dry no rash noted   Recent Labs  07/20/14 2040 07/21/14 0618  WBC 2.2* 2.2*  HGB 13.0 12.5*  HCT 37.4* 37.2*  PLT 123* 113*   BMET  Recent Labs  07/20/14 2040 07/21/14 0618  NA 137 137  K 3.9 4.1  CL 105 105  CO2 23 24  GLUCOSE 115* 106*  BUN 26* 26*  CREATININE 1.63* 1.50*  CALCIUM 8.7* 8.5*    Studies/Results: Dg Chest 2 View  07/20/2014   CLINICAL DATA:  Left-sided chest pain, shortness of breath, and chills.  EXAM: CHEST  2 VIEW  COMPARISON:  None.  FINDINGS: Slight interstitial pattern to the lung bases, suggesting mild fibrosis or chronic bronchitic change. No evidence of focal consolidation or airspace disease. No blunting of costophrenic angles. No pneumothorax. Normal heart  size and pulmonary vascularity. Mediastinal contours are intact.  IMPRESSION: No active cardiopulmonary disease.   Electronically Signed   By: Lucienne Capers M.D.   On: 07/20/2014 21:13    Medications:  . aspirin  325 mg Oral QPM  . betamethasone valerate lotion   Topical BID  . doxycycline  100 mg Oral Q12H  . enoxaparin (LOVENOX) injection  40 mg Subcutaneous Q24H  . fenofibrate  54 mg Oral Daily  . lisinopril  5 mg Oral QPM  . niacin  500 mg Oral QHS  . sodium chloride  3 mL Intravenous Q12H  . tiotropium  18 mcg Inhalation Daily        Assessment/Plan: 1. Patient had multiple tick bites and fever-awaiting cultures to be completed continue IV Vibramycin  2. Chest pain-prior history of coronary artery disease with multiple stent placements-continue daily aspirin tabs Nitrostat as needed patient's troponins have been normal EKG minor ST segment changes   LOS: 2 days   Nimrat Woolworth G 07/22/2014, 6:56 AM

## 2014-07-23 DIAGNOSIS — R079 Chest pain, unspecified: Secondary | ICD-10-CM

## 2014-07-23 LAB — EHRLICHIA ANTIBODY PANEL
E chaffeensis (HGE) Ab, IgG: NEGATIVE
E chaffeensis (HGE) Ab, IgM: NEGATIVE
E. CHAFFEENSIS (HME) IGM TITER: NEGATIVE
E.Chaffeensis (HME) IgG: NEGATIVE

## 2014-07-23 LAB — B. BURGDORFI ANTIBODIES: B burgdorferi Ab IgG+IgM: <0.91 {ISR} (ref 0.00–0.90)

## 2014-07-23 MED ORDER — NIACIN ER 250 MG PO CPCR
ORAL_CAPSULE | ORAL | Status: AC
  Filled 2014-07-23: qty 2

## 2014-07-23 MED ORDER — METOPROLOL SUCCINATE ER 25 MG PO TB24
12.5000 mg | ORAL_TABLET | Freq: Every day | ORAL | Status: DC
  Administered 2014-07-23 – 2014-07-24 (×2): 12.5 mg via ORAL
  Filled 2014-07-23 (×2): qty 1

## 2014-07-23 NOTE — Progress Notes (Signed)
[name] a total of 13 cardiac stents old records not exactly available with anginal type chest pain none today has Cardiolite scheduled for tomorrow patient will be kept inpatient due to the high probability this is ischemic pain due to his multiple interventions Phillip Pace ZRA:[id number] DOB: 12-22-1945 DOA: 07/20/2014 PCP: No primary care provider on file.             Physical Exam: Blood pressure 136/71, pulse 56, temperature 97.6 F (36.4 C), temperature source Oral, resp. rate 20, height 6\' 2"  (1.88 m), weight 189 lb 8 oz (85.957 kg), SpO2 97 %. lungs clear to A&P no rales wheeze rhonchi heart regular rhythm no murmurs goes rubs abdomen soft nontender bowel sounds normoactive no guarding or rebound masses no megaly extremities no clubbing cyanosis or edema   Investigations:  No results found for this or any previous visit (from the past 240 hour(s)).   Basic Metabolic Panel:  Recent Labs  07/20/14 2040 07/21/14 0618  NA 137 137  K 3.9 4.1  CL 105 105  CO2 23 24  GLUCOSE 115* 106*  BUN 26* 26*  CREATININE 1.63* 1.50*  CALCIUM 8.7* 8.5*   Liver Function Tests:  Recent Labs  07/20/14 2040 07/21/14 0618  AST 46* 38  ALT 29 28  ALKPHOS 58 55  BILITOT 0.6 0.8  PROT 7.3 6.5  ALBUMIN 3.9 3.6     CBC:  Recent Labs  07/20/14 2040 07/21/14 0618  WBC 2.2* 2.2*  NEUTROABS 1.5*  --   HGB 13.0 12.5*  HCT 37.4* 37.2*  MCV 85.8 86.1  PLT 123* 113*    No results found.    Medications:   Impression:  Active Problems:   Chest pain   ARF (acute renal failure)   Hyperglycemia   Fever     Plan: For Cardiolite functional evaluation in a.m. we'll await to see any reversible ischemia and then decide upon increased medical therapy versus intervention  Consultants: Cardiology   Procedures   Antibiotics: Doxycycline                  Code Status:   Family Communication:  Spoke with patient and wife  Disposition Plan see plan  above  Time spent: 30 minutes   LOS: 3 days   Brittanie Dosanjh M   07/23/2014, 12:43 PM

## 2014-07-23 NOTE — Consult Note (Addendum)
Consulting cardiologist: Dr Carlyle Dolly MD  Clinical Summary Phillip Pace is a 69 y.o.male history of CAD with prior stents (all cardiac care done at various hospitals in [place], [place]), COPD, HTN admitted with chest pain. Chest off and on for several years. This episode different from prior episodes. On Friday had sharp pain midchest, started while pushing lawnmower. Mild dizziness. + SOB. Worst with deep breaths. Pain lasted < 1 minute, pain resolved with rest. Repeat episode of burning chest pain that lasted approximately 15 minutes. Better with NG. Notes some DOE that is variable, can walk up to a mile on some days and then can get SOB wit short distances on some days. . Last stress test 2 years, he is unsure of results.   He also reports subjective fevers and chills, diffuse joint pains over the last several days. Evaluate by primary team, currently on antibiotics.   Allergies  Allergen Reactions  . Penicillins Shortness Of Breath, Itching and Swelling  . Shellfish Allergy Itching and Swelling    Medications Scheduled Medications: . aspirin  325 mg Oral QPM  . betamethasone valerate lotion   Topical BID  . doxycycline  100 mg Oral Q12H  . enoxaparin (LOVENOX) injection  40 mg Subcutaneous Q24H  . fenofibrate  54 mg Oral Daily  . lisinopril  5 mg Oral QPM  . niacin  500 mg Oral QHS  . sodium chloride  3 mL Intravenous Q12H  . tiotropium  18 mcg Inhalation Daily     Infusions:     PRN Medications:  acetaminophen **OR** acetaminophen, albuterol, nitroGLYCERIN   Past Medical History  Diagnosis Date  . MI (myocardial infarction)     x 5  . Presence of stent in artery     13 stents  . CHF (congestive heart failure)   . Arthritis   . COPD (chronic obstructive pulmonary disease)   . Coronary artery disease   . Hypertension     Past Surgical History  Procedure Laterality Date  . Hernia repair    . Fracture surgery      collar bone  . Stents 13       Family History  Problem Relation Age of Onset  . Cancer - Colon Mother   . CAD Mother     Social History [name] reports that he quit smoking about 21 years ago. He does not have any smokeless tobacco history on file. [name] reports that he does not drink alcohol.  Review of Systems CONSTITUTIONAL: + fevers, chills HEENT: Eyes: No visual loss, blurred vision, double vision or yellow sclerae. No hearing loss, sneezing, congestion, runny nose or sore throat.  SKIN: No rash or itching.  CARDIOVASCULAR: per HPI RESPIRATORY: No shortness of breath, cough or sputum.  GASTROINTESTINAL: No anorexia, nausea, vomiting or diarrhea. No abdominal pain or blood.  GENITOURINARY: no polyuria, no dysuria NEUROLOGICAL: No headache, dizziness, syncope, paralysis, ataxia, numbness or tingling in the extremities. No change in bowel or bladder control.  MUSCULOSKELETAL: No muscle, back pain, joint pain or stiffness.  HEMATOLOGIC: No anemia, bleeding or bruising.  LYMPHATICS: No enlarged nodes. No history of splenectomy.  PSYCHIATRIC: No history of depression or anxiety.      Physical Examination Blood pressure 136/71, pulse 56, temperature 97.6 F (36.4 C), temperature source Oral, resp. rate 20, height 6\' 2"  (1.88 m), weight 189 lb 8 oz (85.957 kg), SpO2 98 %.  Intake/Output Summary (Last 24 hours) at 07/23/14 0854 Last data filed at  07/23/14 0755  Gross per 24 hour  Intake   1200 ml  Output    875 ml  Net    325 ml    HEENT: sclera clear  Cardiovascular: RRR, no m/r/g, no JVD  Respiratory: CTAB  GI: abdomen soft, NT, ND  MSK: no LE edema  Neuro: no focal deficits  Psych: appropriate affect   Lab Results  Basic Metabolic Panel:  Recent Labs Lab 07/20/14 2040 07/21/14 0618  NA 137 137  K 3.9 4.1  CL 105 105  CO2 23 24  GLUCOSE 115* 106*  BUN 26* 26*  CREATININE 1.63* 1.50*  CALCIUM 8.7* 8.5*    Liver Function Tests:  Recent Labs Lab 07/20/14 2040  07/21/14 0618  AST 46* 38  ALT 29 28  ALKPHOS 58 55  BILITOT 0.6 0.8  PROT 7.3 6.5  ALBUMIN 3.9 3.6    CBC:  Recent Labs Lab 07/20/14 2040 07/21/14 0618  WBC 2.2* 2.2*  NEUTROABS 1.5*  --   HGB 13.0 12.5*  HCT 37.4* 37.2*  MCV 85.8 86.1  PLT 123* 113*    Cardiac Enzymes:  Recent Labs Lab 07/20/14 2040 07/21/14 0035 07/21/14 0618 07/21/14 1301  TROPONINI <0.03 <0.03 <0.03 <0.03    BNP: Invalid input(s): POCBNP   ECG   Imaging   Impression/Recommendations 1. Chest pain - self reported extensive cardiac history, reports 13 stents placed at various hospitals in [place], [place]. He does not remember the name of his prior cardiologist or his clinic, however does remember an interventionalist Dr Jeni Salles at [facility] who did some of his stents, will request records - mixed cardiac symptoms in setting of non-specific febrile illness w/ diffuse joint pains. On doxy emperically for possible tick borne disease, workup per primary team - trop neg x 4, EKG no ischemic changes - echo LVEF 40-45% with hypokinesis of the anteroseptal and inferoseptal walls, unknown baseline.  - agree patient needs stress test. He ate breakfast this AM, ok for discharge today and for exercise cardiolite tomorrow - change ASA to 81mg  daily, start Toprol XL 12.5mg  daily in setting of systolic dysfunction and as antianginal (I have instructed him not take until after outpatient stress test tomorrow), f/u records to look into statin history (he is on niacin and fibrate only)      Carlyle Dolly, M.D.

## 2014-07-23 NOTE — Clinical Documentation Improvement (Signed)
Presents with CP and multiple tick bites, fever.   Patient with history of CHF  ECHO reveals EF of 40-45% with hypokinesis of anteroseptal and inferoseptal myocardium  Being treated with PO ASA and Zestril  Please provide a diagnosis associated with the above clinical indicators and document findings in next progress note and include in discharge summary if applicable.  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

## 2014-07-23 NOTE — Care Management Note (Signed)
Case Management Note  Patient Details  Name: Phillip Pace MRN: [id number] Date of Birth: 12-10-1945  Expected Discharge Date:  07/23/14               Expected Discharge Plan:  Home/Self Care  In-House Referral:  NA  Discharge planning Services  CM Consult  Post Acute Care Choice:  NA Choice offered to:  NA  DME Arranged:    DME Agency:     HH Arranged:    [place] Agency:     Status of Service:  Completed, signed off  Medicare Important Message Given:    Date Medicare IM Given:    Medicare IM give by:    Date Additional Medicare IM Given:    Additional Medicare Important Message give by:     If discussed at [place] of Stay Meetings, dates discussed:    Additional Comments: Pt is from home, lives with girlfriend. Pt is independent with ADL's. Pt has a cane to use if needed. Pt plans to discharge home with self care. No CM needs.  Sherald Barge, RN 07/23/2014, 1:26 PM

## 2014-07-24 ENCOUNTER — Observation Stay (HOSPITAL_COMMUNITY): Payer: Medicare HMO

## 2014-07-24 ENCOUNTER — Inpatient Hospital Stay (HOSPITAL_COMMUNITY): Admit: 2014-07-24 | Payer: Medicare HMO

## 2014-07-24 DIAGNOSIS — R079 Chest pain, unspecified: Secondary | ICD-10-CM | POA: Diagnosis not present

## 2014-07-24 LAB — NM MYOCAR MULTI W/SPECT W/WALL MOTION / EF
CHL CUP MPHR: 153 {beats}/min
CHL CUP NUCLEAR SDS: 0
CSEPEW: 7 METS
CSEPHR: 80 %
CSEPPHR: 123 {beats}/min
Exercise duration (min): 4 min
Exercise duration (sec): 32 s
LHR: 0.3
LV dias vol: 87 mL
LV sys vol: 43 mL
RPE: 19
Rest HR: 63 {beats}/min
SRS: 0
SSS: 0
TID: 1.04

## 2014-07-24 MED ORDER — DOXYCYCLINE HYCLATE 100 MG PO TABS: 100.0000 mg | ORAL_TABLET | Freq: Two times a day (BID) | ORAL | Status: DC

## 2014-07-24 MED ORDER — TECHNETIUM TC 99M SESTAMIBI GENERIC - CARDIOLITE
10.0000 | Freq: Once | INTRAVENOUS | Status: AC | PRN
  Administered 2014-07-24: 11 via INTRAVENOUS

## 2014-07-24 MED ORDER — SODIUM CHLORIDE 0.9 % IJ SOLN
INTRAMUSCULAR | Status: AC
  Administered 2014-07-24: 10 mL via INTRAVENOUS
  Filled 2014-07-24: qty 3

## 2014-07-24 MED ORDER — METOPROLOL SUCCINATE ER 25 MG PO TB24: 25.0000 mg | ORAL_TABLET | Freq: Every day | ORAL | Status: DC

## 2014-07-24 MED ORDER — REGADENOSON 0.4 MG/5ML IV SOLN
INTRAVENOUS | Status: AC
  Administered 2014-07-24: 0.4 mg via INTRAVENOUS
  Filled 2014-07-24: qty 5

## 2014-07-24 MED ORDER — NIACIN ER 500 MG PO CPCR: 500.0000 mg | ORAL_CAPSULE | Freq: Every day | ORAL | Status: DC

## 2014-07-24 MED ORDER — TECHNETIUM TC 99M SESTAMIBI - CARDIOLITE
30.0000 | Freq: Once | INTRAVENOUS | Status: AC | PRN
  Administered 2014-07-24: 30 via INTRAVENOUS

## 2014-07-24 NOTE — Discharge Summary (Signed)
[name] instructed to follow-up in the office within one week's time to take all medicines upon discharge from hospital to finish doxycycline 100 mg by mouth twice a day for 7 additional days his Cardiolite test revealed an area of inferolateral apical scar however no surrounding areas reversible ischemia detected Phillip Pace JIR:[id number] DOB: [birth year] DOA: 07/20/2014 PCP: No primary care provider on file.             Physical Exam: Blood pressure 123/62, pulse 63, temperature 97.5 F (36.4 C), temperature source Oral, resp. rate 20, height 6\' 2"  (1.88 Pace), weight 188 lb 8 oz (85.503 kg), SpO2 98 %. lungs clear to A&P no rales rhonchi heart regular rhythm no murmurs goes heaves thrills or rubs abdomen soft nontender bowel sounds normoactive   Investigations:  No results found for this or any previous visit (from the past 240 hour(s)).   Basic Metabolic Panel: No results for input(s): NA, K, CL, CO2, GLUCOSE, BUN, CREATININE, CALCIUM, MG, PHOS in the last 72 hours. Liver Function Tests: No results for input(s): AST, ALT, ALKPHOS, BILITOT, PROT, ALBUMIN in the last 72 hours.   CBC: No results for input(s): WBC, NEUTROABS, HGB, HCT, MCV, PLT in the last 72 hours.  Nm Myocar Multi W/spect W/wall Motion / Ef  07/24/2014    There was no ST segment deviation noted during stress.  Mid to basal inferior and inferoseptal scar. No ischemic zones.  Low normal LV systolic function, calculated LVEF 50%.  Low risk study.       Medications  Impression:  Active Problems:   Chest pain   ARF (acute renal failure)   Hyperglycemia   Fever     Plan: Addition of Toprol-XL 25 mg by mouth daily as well as doxycycline by mouth twice a day for 7 additional days problem known febrile illness which is responsive to doxycycline and is to take aspirin every day   Consultants: Phillip Pace   Procedure   Antibiotics: Doxycycline 100 mg by mouth twice a day                   Code Status: Full  Family Communication:  Spoke with patient and wife at length  Disposition Plan see plan  Time spent: 3 minutes   LOS: 4 days   Phillip Pace   07/24/2014, 5:29 PM

## 2014-07-24 NOTE — Care Management Note (Signed)
Case Management Note  Patient Details  Name: Phillip Pace MRN: [id number] Date of Birth: 1945-02-25  Expected Discharge Date:  07/23/14               Expected Discharge Plan:  Home/Self Care  In-House Referral:  NA  Discharge planning Services  CM Consult  Post Acute Care Choice:  NA Choice offered to:  NA  DME Arranged:    DME Agency:     HH Arranged:    [place] Agency:     Status of Service:  Completed, signed off  Medicare Important Message Given:    Date Medicare IM Given:    Medicare IM give by:    Date Additional Medicare IM Given:    Additional Medicare Important Message give by:     If discussed at [place] of Stay Meetings, dates discussed:    Additional Comments: Pt being discharged home today. No CM needs at the time of discharge.  Sherald Barge, RN 07/24/2014, 3:16 PM

## 2014-07-24 NOTE — Progress Notes (Signed)
Called to dictation room by [facility], did not see order for stress test seen in Dr. Nelly Laurence dictation.  Informed Dondiego that patient had been NPO since midnight in preparation for stress test.  He requested I call Dr. Harl Bowie.  Called cell [phone], entered desk ext.  No return call.  Reported to oncoming nurse.

## 2014-07-24 NOTE — Progress Notes (Signed)
Patient being d/c home with prescriptions. IV cath removed and intact. No c/o pain at the site or at this time. Family at bedside.

## 2014-07-24 NOTE — Progress Notes (Signed)
Patient with 13 previous cardiac stents awaiting Cardiolite functional evaluation today so throughout the night had no true anginal chest pain Phillip Pace ZMO:[id number] DOB: 19-Sep-1945 DOA: 07/20/2014 PCP: No primary care provider on file.             Physical Exam: Blood pressure 134/65, pulse 58, temperature 97.9 F (36.6 C), temperature source Oral, resp. rate 20, height 6\' 2"  (1.88 m), weight 188 lb 8 oz (85.503 kg), SpO2 94 %. lungs clear to A&P no rales wheeze rhonchi heart rhythm no murmurs, sees rubs abdomen soft nontender bowel sounds normoactive   Investigations:  No results found for this or any previous visit (from the past 240 hour(s)).   Basic Metabolic Panel: No results for input(s): NA, K, CL, CO2, GLUCOSE, BUN, CREATININE, CALCIUM, MG, PHOS in the last 72 hours. Liver Function Tests: No results for input(s): AST, ALT, ALKPHOS, BILITOT, PROT, ALBUMIN in the last 72 hours.   CBC: No results for input(s): WBC, NEUTROABS, HGB, HCT, MCV, PLT in the last 72 hours.  No results found.    Medications:   Impression:  Active Problems:   Chest pain   ARF (acute renal failure)   Hyperglycemia   Fever     Plan: Await Cardiolite functional test today   Consultants: Cardiology    Procedures   Antibiotics: Doxycycline                  Code Status:   Family Communication:  [place] with patient and wife yesterday  Disposition Plan see plan above  Time spent: 30 minutes   LOS: 4 days   Bintou Lafata M   07/24/2014, 6:36 AM

## 2014-08-10 ENCOUNTER — Encounter: Payer: Self-pay | Admitting: Adult Health

## 2014-08-10 ENCOUNTER — Ambulatory Visit (INDEPENDENT_AMBULATORY_CARE_PROVIDER_SITE_OTHER): Payer: Medicare HMO | Admitting: Adult Health

## 2014-08-10 VITALS — BP 116/72 | HR 65 | Ht 74.0 in | Wt 195.0 lb

## 2014-08-10 DIAGNOSIS — I2510 Atherosclerotic heart disease of native coronary artery without angina pectoris: Secondary | ICD-10-CM | POA: Diagnosis not present

## 2014-08-10 DIAGNOSIS — R0609 Other forms of dyspnea: Secondary | ICD-10-CM | POA: Diagnosis not present

## 2014-08-10 NOTE — Progress Notes (Deleted)
Name: Phillip Pace    DOB: 25-Oct-1945  Age: 69 y.o.  MR#: [id number]       PCP:  No primary care provider on file.      Insurance: Payor: [employer] MEDICARE / Plan: [employer] MEDICARE HMO / Product Type: *No Product type* /   CC:    Chief Complaint  Patient presents with  . Coronary Artery Disease    stents  . Hypertension    VS Filed Vitals:   08/10/14 1332  BP: 116/72  Pulse: 65  Height: 6\' 2"  (1.88 m)  Weight: 195 lb (88.451 kg)  SpO2: 95%    Weights Current Weight  08/10/14 195 lb (88.451 kg)  07/24/14 188 lb 8 oz (85.503 kg)    Blood Pressure  BP Readings from Last 3 Encounters:  08/10/14 116/72  07/24/14 123/62     Admit date:  (Not on file) Last encounter with RMR:  Visit date not found   Allergy Penicillins and Shellfish allergy  Current Outpatient Prescriptions  Medication Sig Dispense Refill  . aspirin 325 MG EC tablet Take 325 mg by mouth every evening.    . fenofibrate micronized (LOFIBRA) 134 MG capsule Take 134 mg by mouth every evening.    Marland Kitchen ibuprofen (ADVIL,MOTRIN) 600 MG tablet Take 600 mg by mouth every 6 (six) hours as needed for mild pain or moderate pain.    Marland Kitchen lisinopril (PRINIVIL,ZESTRIL) 5 MG tablet Take 5 mg by mouth every evening.    . metoprolol succinate (TOPROL-XL) 25 MG 24 hr tablet Take 1 tablet (25 mg total) by mouth daily. 30 tablet 6  . niacin (SLO-NIACIN) 500 MG tablet Take 500 mg by mouth at bedtime.    Marland Kitchen NITROSTAT 0.4 MG SL tablet Take one tablet by mouth as needed for chest pain. Repeat in 5 minutes if no relief for up to 3 total doses as needed for chest pain.  5  . pravastatin (PRAVACHOL) 40 MG tablet TK 1 T PO QD  5   No current facility-administered medications for this visit.    Discontinued Meds:    Medications Discontinued During This Encounter  Medication Reason  . doxycycline (VIBRA-TABS) 100 MG tablet Error  . niacin 500 MG CR capsule Error    Patient Active Problem List   Diagnosis Date Noted  . Chest pain  07/20/2014  . ARF (acute renal failure) 07/20/2014  . Hyperglycemia 07/20/2014  . Fever 07/20/2014    LABS    Component Value Date/Time   NA 137 07/21/2014 0618   NA 137 07/20/2014 2040   K 4.1 07/21/2014 0618   K 3.9 07/20/2014 2040   CL 105 07/21/2014 0618   CL 105 07/20/2014 2040   CO2 24 07/21/2014 0618   CO2 23 07/20/2014 2040   GLUCOSE 106* 07/21/2014 0618   GLUCOSE 115* 07/20/2014 2040   BUN 26* 07/21/2014 0618   BUN 26* 07/20/2014 2040   CREATININE 1.50* 07/21/2014 0618   CREATININE 1.63* 07/20/2014 2040   CALCIUM 8.5* 07/21/2014 0618   CALCIUM 8.7* 07/20/2014 2040   GFRNONAA 46* 07/21/2014 0618   GFRNONAA 42* 07/20/2014 2040   GFRAA 54* 07/21/2014 0618   GFRAA 49* 07/20/2014 2040   CMP     Component Value Date/Time   NA 137 07/21/2014 0618   K 4.1 07/21/2014 0618   CL 105 07/21/2014 0618   CO2 24 07/21/2014 0618   GLUCOSE 106* 07/21/2014 0618   BUN 26* 07/21/2014 0618   CREATININE 1.50* 07/21/2014 1025  CALCIUM 8.5* 07/21/2014 0618   PROT 6.5 07/21/2014 0618   ALBUMIN 3.6 07/21/2014 0618   AST 38 07/21/2014 0618   ALT 28 07/21/2014 0618   ALKPHOS 55 07/21/2014 0618   BILITOT 0.8 07/21/2014 0618   GFRNONAA 46* 07/21/2014 0618   GFRAA 54* 07/21/2014 0618       Component Value Date/Time   WBC 2.2* 07/21/2014 0618   WBC 2.2* 07/20/2014 2040   HGB 12.5* 07/21/2014 0618   HGB 13.0 07/20/2014 2040   HCT 37.2* 07/21/2014 0618   HCT 37.4* 07/20/2014 2040   MCV 86.1 07/21/2014 0618   MCV 85.8 07/20/2014 2040    Lipid Panel     Component Value Date/Time   CHOL 178 07/21/2014 0618   TRIG 158* 07/21/2014 0618   HDL 33* 07/21/2014 0618   CHOLHDL 5.4 07/21/2014 0618   VLDL 32 07/21/2014 0618   LDLCALC 113* 07/21/2014 0618    ABG No results found for: PHART, PCO2ART, PO2ART, HCO3, TCO2, ACIDBASEDEF, O2SAT   No results found for: TSH BNP (last 3 results) No results for input(s): BNP in the last 8760 hours.  ProBNP (last 3 results) No results  for input(s): PROBNP in the last 8760 hours.  Cardiac Panel (last 3 results) No results for input(s): CKTOTAL, CKMB, TROPONINI, RELINDX in the last 72 hours.  Iron/TIBC/Ferritin/ %Sat No results found for: IRON, TIBC, FERRITIN, IRONPCTSAT   EKG Orders placed or performed during the hospital encounter of 07/20/14  . ED EKG  . ED EKG  . EKG 12-Lead  . EKG 12-Lead  . EKG 12-Lead  . EKG 12-Lead  . EKG 12-Lead  . EKG 12-Lead  . EKG 12-Lead  . EKG 12-Lead  . EKG     Prior Assessment and Plan Problem List as of 08/10/2014      Genitourinary   ARF (acute renal failure)     Other   Chest pain   Hyperglycemia   Fever       Imaging: Dg Chest 2 View  07/20/2014   CLINICAL DATA:  Left-sided chest pain, shortness of breath, and chills.  EXAM: CHEST  2 VIEW  COMPARISON:  None.  FINDINGS: Slight interstitial pattern to the lung bases, suggesting mild fibrosis or chronic bronchitic change. No evidence of focal consolidation or airspace disease. No blunting of costophrenic angles. No pneumothorax. Normal heart size and pulmonary vascularity. Mediastinal contours are intact.  IMPRESSION: No active cardiopulmonary disease.   Electronically Signed   By: Lucienne Capers M.D.   On: 07/20/2014 21:13   Nm Myocar Multi W/spect W/wall Motion / Ef  07/24/2014    There was no ST segment deviation noted during stress.  Mid to basal inferior and inferoseptal scar. No ischemic zones.  Low normal LV systolic function, calculated LVEF 50%.  Low risk study.

## 2014-08-10 NOTE — Patient Instructions (Signed)
Your physician recommends that you schedule a follow-up appointment in: 3 months with Dr. Harl Bowie  Your physician recommends that you continue on your current medications as directed. Please refer to the Current Medication list given to you today.  STOP PRAVASTATIN FOR ONE WEEK. CALL OUR OFFICE AT [phone] TO LET us KNOW HOW YOU ARE FEELING AFTER YOUR WEEK IS UP.  Thank you for choosing [facility]!

## 2014-08-10 NOTE — Progress Notes (Signed)
Cardiology Office Note   Date:  08/10/2014   ID:  Kavon Valenza, DOB [birth year], MRN [id number]  PCP:  No primary care provider on file.  Cardiologist:  Cloria Spring, NP   Chief Complaint  Patient presents with  . Coronary Artery Disease    stents  . Hypertension      History of Present Illness: Phillip Pace is a 69 y.o. male who presents for ongoing assessment and management of CAD with hx of prior stents to unknown arteries at various hospitals , CHF, hypertension, and hx of COPD. We are seeing him post hospitalization where he was evaluated on consultation for chest pain. He was ruled out for ACS and planned for cardiolite stress test.   Stress test was completed on 07/24/2014 demonstrating smal defect of mild severity present in the basal inferoeseptal, basal inferior, mid inferoseptal, and mid inferior location which was non-reversible, consistent with prior MI.  LVEF of 50%, overall low risk study. Echocardiogram demonstrated LVEF of 40%-45% with Grade I diastolic dysfunction and  Normal pulmonary pressures.   He was continued on medications to include doxycycline for febrile illness and placed on toprol XL 25 mg daily.   He state he still has DOE, NYHA Class II. He states when he was in [facility], he was on O2 at home. He has not been on it since moving to [place]. He has nebulizer machine which he does not use since moving her either. His wife is concerned that he may need to have medications added back for his breathing. He has not seen his PCP, Dr. Lorriane Shire yet. He also has symptoms of OSA. Wife states he snores and looks as if he stops breathing during the night. He is often tired during the day and falls asleep easily when sitting for long periods of time. He complains of myalgia's with use of pravastatin.     Past Medical History  Diagnosis Date  . MI (myocardial infarction)     x 5  . Presence of stent in artery     13 stents  . CHF (congestive heart failure)    . Arthritis   . COPD (chronic obstructive pulmonary disease)   . Coronary artery disease   . Hypertension     Past Surgical History  Procedure Laterality Date  . Hernia repair    . Fracture surgery      collar bone  . Stents 13       Current Outpatient Prescriptions  Medication Sig Dispense Refill  . aspirin 325 MG EC tablet Take 325 mg by mouth every evening.    . fenofibrate micronized (LOFIBRA) 134 MG capsule Take 134 mg by mouth every evening.    Marland Kitchen ibuprofen (ADVIL,MOTRIN) 600 MG tablet Take 600 mg by mouth every 6 (six) hours as needed for mild pain or moderate pain.    Marland Kitchen lisinopril (PRINIVIL,ZESTRIL) 5 MG tablet Take 5 mg by mouth every evening.    . metoprolol succinate (TOPROL-XL) 25 MG 24 hr tablet Take 1 tablet (25 mg total) by mouth daily. 30 tablet 6  . niacin (SLO-NIACIN) 500 MG tablet Take 500 mg by mouth at bedtime.    Marland Kitchen NITROSTAT 0.4 MG SL tablet Take one tablet by mouth as needed for chest pain. Repeat in 5 minutes if no relief for up to 3 total doses as needed for chest pain.  5  . pravastatin (PRAVACHOL) 40 MG tablet TK 1 T PO QD  5   No current facility-administered  medications for this visit.    Allergies:   Penicillins and Shellfish allergy    Social History:  The patient  reports that he quit smoking about 21 years ago. He does not have any smokeless tobacco history on file. He reports that he does not drink alcohol or use illicit drugs.   Family History:  The patient's family history includes CAD in his mother; Cancer - Colon in his mother.    ROS: .   All other systems are reviewed and negative.Unless otherwise mentioned in H&P above.   PHYSICAL EXAM: VS:  BP 116/72 mmHg  Pulse 65  Ht 6\' 2"  (1.88 m)  Wt 195 lb (88.451 kg)  BMI 25.03 kg/m2  SpO2 95% , BMI Body mass index is 25.03 kg/(m^2). GEN: Well nourished, well developed, in no acute distress HEENT: normal Neck: no JVD, carotid bruits, or masses Cardiac: RRR; no murmurs, rubs, or  gallops,no edema  Respiratory:  Clear to auscultation bilaterally, normal work of breathing, diminished in the bases, without wheezes.  GI: soft, nontender, nondistended, + BS MS: no deformity or atrophy Skin: warm and dry, no rash Neuro:  Strength and sensation are intact Psych: euthymic mood, full affect  Recent Labs: 07/21/2014: ALT 28; BUN 26*; Creatinine, Ser 1.50*; Hemoglobin 12.5*; Platelets 113*; Potassium 4.1; Sodium 137    Lipid Panel    Component Value Date/Time   CHOL 178 07/21/2014 0618   TRIG 158* 07/21/2014 0618   HDL 33* 07/21/2014 0618   CHOLHDL 5.4 07/21/2014 0618   VLDL 32 07/21/2014 0618   LDLCALC 113* 07/21/2014 0618      Wt Readings from Last 3 Encounters:  08/10/14 195 lb (88.451 kg)  07/24/14 188 lb 8 oz (85.503 kg)      ASSESSMENT AND PLAN:  1.CAD:  Hx of multiple stents, to unknown arteries. Recent Cardiolite MPI was of low risk. He will continue metoprolol, lisinopril, will hold pravastatin for one week to ascertain if he has relief from myalgias. If he does, he is to decrease the dose to 20 mg daily. Will see him again in  3 months.   2. DOE: He has symptoms of OSA and possible COPD: He was on O2 when living in [facility] but not since moving to [place]. I have recommended PFT's and/opr sleep study. He wishes to discuss this with Dr. Lorriane Shire before having the testing. Will defer to PCP for his recommendations.   3. Hypertension: Good control currently. No changes in medications at this time.    Current medicines are reviewed at length with the patient today.    Labs/ tests ordered today include: None   Disposition:   FU with 3-6 months  Signed, Jory Sims, NP  08/10/2014 1:53 PM    [facility] [address], [place], [place] [address] Phone: [phone]; Fax: [phone]

## 2014-08-27 ENCOUNTER — Other Ambulatory Visit (HOSPITAL_COMMUNITY): Payer: Self-pay | Admitting: Family Medicine

## 2014-08-27 ENCOUNTER — Ambulatory Visit (HOSPITAL_COMMUNITY)
Admission: RE | Admit: 2014-08-27 | Discharge: 2014-08-27 | Disposition: A | Discharge status: Home / Self Care | Payer: Medicare HMO | Source: Ambulatory Visit | Attending: Family Medicine | Admitting: Family Medicine

## 2014-08-27 DIAGNOSIS — M479 Spondylosis, unspecified: Secondary | ICD-10-CM

## 2014-08-27 DIAGNOSIS — Y92009 Unspecified place in unspecified non-institutional (private) residence as the place of occurrence of the external cause: Secondary | ICD-10-CM | POA: Diagnosis not present

## 2014-08-27 DIAGNOSIS — M25512 Pain in left shoulder: Secondary | ICD-10-CM | POA: Insufficient documentation

## 2014-08-27 DIAGNOSIS — M25552 Pain in left hip: Secondary | ICD-10-CM | POA: Insufficient documentation

## 2014-08-27 DIAGNOSIS — W19XXXA Unspecified fall, initial encounter: Secondary | ICD-10-CM | POA: Diagnosis not present

## 2014-08-27 DIAGNOSIS — M5136 Other intervertebral disc degeneration, lumbar region: Secondary | ICD-10-CM | POA: Diagnosis not present

## 2014-08-27 DIAGNOSIS — M545 Low back pain: Secondary | ICD-10-CM | POA: Diagnosis present

## 2014-09-28 ENCOUNTER — Other Ambulatory Visit (HOSPITAL_COMMUNITY): Payer: Self-pay | Admitting: Family Medicine

## 2014-09-28 DIAGNOSIS — M5442 Lumbago with sciatica, left side: Secondary | ICD-10-CM

## 2014-10-10 ENCOUNTER — Ambulatory Visit (HOSPITAL_COMMUNITY): Payer: Medicare HMO

## 2017-03-04 ENCOUNTER — Encounter: Payer: Self-pay | Admitting: Internal Medicine

## 2017-03-23 ENCOUNTER — Ambulatory Visit (INDEPENDENT_AMBULATORY_CARE_PROVIDER_SITE_OTHER): Payer: Self-pay

## 2017-03-23 DIAGNOSIS — Z86010 Personal history of colonic polyps: Secondary | ICD-10-CM

## 2017-03-23 NOTE — Progress Notes (Signed)
Gastroenterology Pre-Procedure Review  Request Date:03/23/17 Requesting Physician: Dr.DonDiego ( last tcs 4-5 years ago in [place])  PATIENT REVIEW QUESTIONS: The patient responded to the following health history questions as indicated:    1. Diabetes Melitis: no 2. Joint replacements in the past 12 months: no 3. Major health problems in the past 3 months: no 4. Has an artificial valve or MVP: no 5. Has a defibrillator: no 6. Has been advised in past to take antibiotics in advance of a procedure like teeth cleaning: no 7. Family history of colon cancer: yes (monther age 21 and father- unsure of age )  23. Alcohol Use: no 9. History of sleep apnea: no  10. History of coronary artery or other vascular stents placed within the last 12 months: no 11. History of any prior anesthesia complications: no    MEDICATIONS & ALLERGIES:    Patient reports the following regarding taking any blood thinners:   Plavix? no Aspirin? yes (325mg ) Coumadin? no Brilinta? no Xarelto? no Eliquis? no Pradaxa? no Savaysa? no Effient? no  Patient confirms/reports the following medications:  Current Outpatient Medications  Medication Sig Dispense Refill  . aspirin 325 MG EC tablet Take 325 mg by mouth every evening.    Marland Kitchen b complex vitamins capsule Take 1 capsule by mouth daily.    . Ergocalciferol (VITAMIN D2 PO) Take by mouth.    Marland Kitchen lisinopril (PRINIVIL,ZESTRIL) 5 MG tablet Take 5 mg by mouth every evening.    . magnesium oxide (MAG-OX) 400 MG tablet Take 400 mg by mouth daily.    . metoprolol succinate (TOPROL-XL) 25 MG 24 hr tablet Take 1 tablet (25 mg total) by mouth daily. 30 tablet 6  . niacin (SLO-NIACIN) 500 MG tablet Take 500 mg by mouth at bedtime.    Marland Kitchen NITROSTAT 0.4 MG SL tablet Take one tablet by mouth as needed for chest pain. Repeat in 5 minutes if no relief for up to 3 total doses as needed for chest pain.  5  . pravastatin (PRAVACHOL) 40 MG tablet TK 1 T PO QD  5  . tamsulosin (FLOMAX) 0.4  MG CAPS capsule daily.     No current facility-administered medications for this visit.     Patient confirms/reports the following allergies:  Allergies  Allergen Reactions  . Penicillins Shortness Of Breath, Itching and Swelling  . Shellfish Allergy Itching and Swelling    No orders of the defined types were placed in this encounter.   AUTHORIZATION INFORMATION Primary Insurance:   ID #:   Group #:  Pre-Cert / Auth required: Pre-Cert / Auth #:    SCHEDULE INFORMATION: Procedure has been scheduled as follows:  Date: , Time:  Location:   This Gastroenterology Pre-Precedure Review Form is being routed to the following provider(s): Roseanne Kaufman NP

## 2017-03-23 NOTE — Progress Notes (Signed)
Pt came in for a nurse visit. He stated he was having some difficulty swallowing and food was getting stuck when he eats. He would also like an EGD. I advised pt he would need an OV with a provider to schedule that and he would be able to schedule the EGD and the TCS to be done at the same time. Pt was agreeable with this. Erline Levine has scheduled him for an OV on 04/07/17 with EG. We asked the pt to sign a release so we could get his last tcs/path results from [place]. I have given his referral paperwork to Manuela Schwartz and she will get the last tcs and save it for his OV.   Pt has a service dog that he will be bringing to his appt.

## 2017-03-24 NOTE — Progress Notes (Signed)
Appropriate.

## 2017-04-07 ENCOUNTER — Encounter: Payer: Self-pay | Admitting: Nurse Practitioner

## 2017-04-07 ENCOUNTER — Other Ambulatory Visit: Payer: Self-pay

## 2017-04-07 ENCOUNTER — Ambulatory Visit: Payer: Medicare HMO | Admitting: Nurse Practitioner

## 2017-04-07 DIAGNOSIS — R1319 Other dysphagia: Secondary | ICD-10-CM

## 2017-04-07 DIAGNOSIS — Z800 Family history of malignant neoplasm of digestive organs: Secondary | ICD-10-CM | POA: Diagnosis not present

## 2017-04-07 DIAGNOSIS — R1310 Dysphagia, unspecified: Secondary | ICD-10-CM | POA: Diagnosis not present

## 2017-04-07 DIAGNOSIS — Z86010 Personal history of colonic polyps: Secondary | ICD-10-CM | POA: Diagnosis not present

## 2017-04-07 MED ORDER — PEG 3350-KCL-NA BICARB-NACL 420 G PO SOLR: 4000.0000 mL | ORAL | 0 refills | Status: DC

## 2017-04-07 NOTE — Assessment & Plan Note (Signed)
Solid food dysphagia without regurgitation for the past 2 years.  Symptoms are chronic and not worsening.  Denies overt GERD symptoms.  Occasional pain with passing of food when drinking water.  At this point, given he is due for colonoscopy, we will add an upper endoscopy with possible dilation to further evaluate.  Proceed with EGD with Dr. Gala Romney in near future: the risks, benefits, and alternatives have been discussed with the patient in detail. The patient states understanding and desires to proceed.  The patient is not on any anticoagulants, anxiolytics, chronic pain medications, or antidepressants.  Denies alcohol and drug use.  Conscious sedation should be adequate for his procedure.

## 2017-04-07 NOTE — Assessment & Plan Note (Signed)
History of adenomatous colon polyps.  Previous colonoscopy completed in [place] which found tubular adenoma and tubulovillous adenoma.  Recommended repeat 2017.  He is currently overdue.  High risk due to family history of colon cancer as per below.  At this point we will proceed with a colonoscopy to update his surveillance.  Proceed with TCS with Dr. Gala Romney in near future: the risks, benefits, and alternatives have been discussed with the patient in detail. The patient states understanding and desires to proceed.  [name] not on any anticoagulants, anxiolytics, chronic pain medications, or antidepressants.  Denies alcohol and drug use.  Conscious sedation should be adequate for his procedure.

## 2017-04-07 NOTE — Progress Notes (Signed)
cc'ed to pcp °

## 2017-04-07 NOTE — Progress Notes (Signed)
Primary Care Physician:  Lucia Gaskins, MD Primary Gastroenterologist:  Dr. Gala Romney  Chief Complaint  Patient presents with  . Dysphagia  . Colonoscopy    consult    HPI:   Phillip Pace is a 72 y.o. male who presents for consideration of colonoscopy.  The patient was initially triaged by the nurse at which point the patient indicated he was having some dysphagia issues.  It was recommended he come to an office visit for additional consideration of upper endoscopy with possible dilation.  Previous colonoscopy/pathology results requested from [place] and awaiting arrival.  His colonoscopy report was received from [place].  Exam completed 04/15/2012 which found semi-pedunculated polyp in the hepatic flexure, 2 sessile polyps in the transverse colon, mild segmental inflammation and erythema in the mid sigmoid colon.  Surgical pathology found the polyps to be tubular adenoma and tubulovillous adenoma.  Sigmoid biopsy found to be unremarkable colonic mucosa.  Recommended repeat exam in 3 years (2017).  Today he is accompanied by a service dog and his wife.  Today he states he's doing well overall. Has minimal abdominal pain if he has constipation. Denies hematochezia, melena, fever, chills, unintentional weight loss. Has hemorrhoids which generally don't bother him. Started with solid food dysphagia about 2 years ago and is stable. Food passes with time and fluids; occasionally hurts when it passes. No regurgitation. Denies N/V. Rare pill dysphagia. Occasional dyspnea related to COPD and at baseline currently. Denies chest pain, dizziness, lightheadedness, syncope, near syncope. Denies any other upper or lower GI symptoms.  He takes his lisinopril and metoprolol to every other day due to joint pains. Last MI 8-9 years ago. Mother had colon ca and passed from this. His father had colon ca as well.  Past Medical History:  Diagnosis Date  . Arthritis   . CHF (congestive heart failure) ([place])   .  COPD (chronic obstructive pulmonary disease) ([facility])   . Coronary artery disease   . Hypertension   . MI (myocardial infarction) ([place])    x 5  . Presence of stent in artery    13 stents    Past Surgical History:  Procedure Laterality Date  . FRACTURE SURGERY     collar bone  . HERNIA REPAIR     x 2  . stents 13      Current Outpatient Medications  Medication Sig Dispense Refill  . aspirin 325 MG EC tablet Take 325 mg by mouth every evening.    Marland Kitchen b complex vitamins capsule Take 1 capsule by mouth daily.    . Ergocalciferol (VITAMIN D2 PO) Take by mouth daily.     Marland Kitchen lisinopril (PRINIVIL,ZESTRIL) 5 MG tablet Take 5 mg by mouth every evening.    . magnesium oxide (MAG-OX) 400 MG tablet Take 400 mg by mouth daily.    . metoprolol succinate (TOPROL-XL) 25 MG 24 hr tablet Take 1 tablet (25 mg total) by mouth daily. 30 tablet 6  . niacin (SLO-NIACIN) 500 MG tablet Take 500 mg by mouth at bedtime.    Marland Kitchen NITROSTAT 0.4 MG SL tablet Take one tablet by mouth as needed for chest pain. Repeat in 5 minutes if no relief for up to 3 total doses as needed for chest pain.  5  . pravastatin (PRAVACHOL) 40 MG tablet TK 1 T PO QD  5  . tamsulosin (FLOMAX) 0.4 MG CAPS capsule daily.     No current facility-administered medications for this visit.     Allergies as of  04/07/2017 - Review Complete 04/07/2017  Allergen Reaction Noted  . Penicillins Shortness Of Breath, Itching, and Swelling 07/20/2014  . Shellfish allergy Itching and Swelling 07/20/2014    Family History  Problem Relation Age of Onset  . Cancer - Colon Mother   . CAD Mother   . Colon cancer Father   . Gastric cancer Neg Hx   . Esophageal cancer Neg Hx     Social History   Socioeconomic History  . Marital status: Single    Spouse name: Not on file  . Number of children: Not on file  . Years of education: Not on file  . Highest education level: Not on file  Social Needs  . Financial resource strain: Not on file  . Food  insecurity - worry: Not on file  . Food insecurity - inability: Not on file  . Transportation needs - medical: Not on file  . Transportation needs - non-medical: Not on file  Occupational History  . Not on file  Tobacco Use  . Smoking status: Former Smoker    Last attempt to quit: 02/02/1993    Years since quitting: 24.1  . Smokeless tobacco: Never Used  Substance and Sexual Activity  . Alcohol use: No    Alcohol/week: 0.0 oz  . Drug use: No  . Sexual activity: Not on file  Other Topics Concern  . Not on file  Social History Narrative  . Not on file    Review of Systems: General: Negative for anorexia, weight loss, fever, chills, fatigue, weakness. ENT: Negative for hoarseness. CV: Negative for chest pain, angina, palpitations, peripheral edema.  Respiratory: Negative for dyspnea at rest, cough, sputum, wheezing.  GI: See history of present illness. MS: Negative for joint pain, low back pain.  Derm: Negative for rash or itching.  Endo: Negative for unusual weight change.  Heme: Negative for bruising or bleeding. Allergy: Negative for rash or hives.    Physical Exam: BP (!) 154/82   Pulse 69   Temp (!) 97 F (36.1 C) (Oral)   Ht 6\' 3"  (1.905 m)   Wt 208 lb 6.4 oz (94.5 kg)   BMI 26.05 kg/m  General:   Alert and oriented. Pleasant and cooperative. Well-nourished and well-developed.  Eyes:  Without icterus, sclera clear and conjunctiva pink.  Ears:  Normal auditory acuity. Cardiovascular:  S1, S2 present without murmurs appreciated. Extremities without clubbing or edema. Respiratory:  Clear to auscultation bilaterally. No wheezes, rales, or rhonchi. No distress.  Gastrointestinal:  +BS, soft, non-tender and non-distended. No HSM noted. No guarding or rebound. No masses appreciated.  Rectal:  Deferred  Musculoskalatal:  Symmetrical without gross deformities. Neurologic:  Alert and oriented x4;  grossly normal neurologically. Psych:  Alert and cooperative. Normal mood  and affect. Heme/Lymph/Immune: No excessive bruising noted.    04/07/2017 9:24 AM   Disclaimer: This note was dictated with voice recognition software. Similar sounding words can inadvertently be transcribed and may not be corrected upon review.

## 2017-04-07 NOTE — Assessment & Plan Note (Signed)
Colon cancer in his family.  Both his mother and his father had colon cancer.  He is a high risk patient for colon cancer due to this.  Proceed with colonoscopy as per above.

## 2017-04-07 NOTE — Patient Instructions (Signed)
1. We will schedule your procedures for you. 2. Further recommendations will be made after your procedures. 3. Return for follow-up in 3 months. 4. Call if you have any questions or concerns.

## 2017-05-25 ENCOUNTER — Encounter (HOSPITAL_COMMUNITY)
Admission: RE | Disposition: A | Discharge status: Home Health | Payer: Self-pay | Source: Ambulatory Visit | Attending: Internal Medicine

## 2017-05-25 ENCOUNTER — Ambulatory Visit (HOSPITAL_COMMUNITY)
Admission: RE | Admit: 2017-05-25 | Discharge: 2017-05-25 | Disposition: A | Discharge status: Home Health | Payer: Medicare HMO | Source: Ambulatory Visit | Attending: Internal Medicine | Admitting: Internal Medicine

## 2017-05-25 ENCOUNTER — Encounter (HOSPITAL_COMMUNITY): Payer: Self-pay | Admitting: *Deleted

## 2017-05-25 ENCOUNTER — Other Ambulatory Visit: Payer: Self-pay

## 2017-05-25 DIAGNOSIS — R1310 Dysphagia, unspecified: Secondary | ICD-10-CM | POA: Insufficient documentation

## 2017-05-25 DIAGNOSIS — Z7982 Long term (current) use of aspirin: Secondary | ICD-10-CM | POA: Insufficient documentation

## 2017-05-25 DIAGNOSIS — Z1211 Encounter for screening for malignant neoplasm of colon: Secondary | ICD-10-CM | POA: Diagnosis present

## 2017-05-25 DIAGNOSIS — D122 Benign neoplasm of ascending colon: Secondary | ICD-10-CM | POA: Insufficient documentation

## 2017-05-25 DIAGNOSIS — K259 Gastric ulcer, unspecified as acute or chronic, without hemorrhage or perforation: Secondary | ICD-10-CM

## 2017-05-25 DIAGNOSIS — R1319 Other dysphagia: Secondary | ICD-10-CM

## 2017-05-25 DIAGNOSIS — I252 Old myocardial infarction: Secondary | ICD-10-CM | POA: Diagnosis not present

## 2017-05-25 DIAGNOSIS — K222 Esophageal obstruction: Secondary | ICD-10-CM | POA: Diagnosis not present

## 2017-05-25 DIAGNOSIS — J449 Chronic obstructive pulmonary disease, unspecified: Secondary | ICD-10-CM | POA: Diagnosis not present

## 2017-05-25 DIAGNOSIS — K5730 Diverticulosis of large intestine without perforation or abscess without bleeding: Secondary | ICD-10-CM | POA: Diagnosis not present

## 2017-05-25 DIAGNOSIS — Z87891 Personal history of nicotine dependence: Secondary | ICD-10-CM | POA: Insufficient documentation

## 2017-05-25 DIAGNOSIS — Z880 Allergy status to penicillin: Secondary | ICD-10-CM | POA: Insufficient documentation

## 2017-05-25 DIAGNOSIS — Z955 Presence of coronary angioplasty implant and graft: Secondary | ICD-10-CM | POA: Insufficient documentation

## 2017-05-25 DIAGNOSIS — I509 Heart failure, unspecified: Secondary | ICD-10-CM | POA: Diagnosis not present

## 2017-05-25 DIAGNOSIS — Z86010 Personal history of colonic polyps: Secondary | ICD-10-CM | POA: Insufficient documentation

## 2017-05-25 DIAGNOSIS — Z91013 Allergy to seafood: Secondary | ICD-10-CM | POA: Insufficient documentation

## 2017-05-25 DIAGNOSIS — I2510 Atherosclerotic heart disease of native coronary artery without angina pectoris: Secondary | ICD-10-CM | POA: Diagnosis not present

## 2017-05-25 DIAGNOSIS — Z800 Family history of malignant neoplasm of digestive organs: Secondary | ICD-10-CM | POA: Diagnosis not present

## 2017-05-25 DIAGNOSIS — K269 Duodenal ulcer, unspecified as acute or chronic, without hemorrhage or perforation: Secondary | ICD-10-CM | POA: Diagnosis not present

## 2017-05-25 DIAGNOSIS — K228 Other specified diseases of esophagus: Secondary | ICD-10-CM | POA: Diagnosis not present

## 2017-05-25 DIAGNOSIS — I110 Hypertensive heart disease with heart failure: Secondary | ICD-10-CM | POA: Insufficient documentation

## 2017-05-25 HISTORY — PX: COLONOSCOPY: SHX5424

## 2017-05-25 HISTORY — PX: ESOPHAGOGASTRODUODENOSCOPY: SHX5428

## 2017-05-25 HISTORY — PX: MALONEY DILATION: SHX5535

## 2017-05-25 SURGERY — COLONOSCOPY
Anesthesia: Moderate Sedation

## 2017-05-25 MED ORDER — LIDOCAINE VISCOUS 2 % MT SOLN
OROMUCOSAL | Status: AC
  Filled 2017-05-25: qty 15

## 2017-05-25 MED ORDER — MIDAZOLAM HCL 5 MG/5ML IJ SOLN
INTRAMUSCULAR | Status: AC
  Filled 2017-05-25: qty 10

## 2017-05-25 MED ORDER — MIDAZOLAM HCL 5 MG/5ML IJ SOLN
INTRAMUSCULAR | Status: DC | PRN
  Administered 2017-05-25: 1 mg via INTRAVENOUS
  Administered 2017-05-25 (×2): 2 mg via INTRAVENOUS

## 2017-05-25 MED ORDER — MEPERIDINE HCL 100 MG/ML IJ SOLN
INTRAMUSCULAR | Status: AC
  Filled 2017-05-25: qty 2

## 2017-05-25 MED ORDER — LIDOCAINE VISCOUS 2 % MT SOLN
OROMUCOSAL | Status: DC | PRN
  Administered 2017-05-25: 4 mL via OROMUCOSAL

## 2017-05-25 MED ORDER — SODIUM CHLORIDE 0.9 % IV SOLN
INTRAVENOUS | Status: DC
  Administered 2017-05-25: 11:00:00 via INTRAVENOUS

## 2017-05-25 MED ORDER — ONDANSETRON HCL 4 MG/2ML IJ SOLN
INTRAMUSCULAR | Status: DC | PRN
  Administered 2017-05-25: 4 mg via INTRAVENOUS

## 2017-05-25 MED ORDER — ONDANSETRON HCL 4 MG/2ML IJ SOLN
INTRAMUSCULAR | Status: AC
  Filled 2017-05-25: qty 2

## 2017-05-25 MED ORDER — STERILE WATER FOR IRRIGATION IR SOLN
Status: DC | PRN
  Administered 2017-05-25: 200 mL

## 2017-05-25 MED ORDER — MEPERIDINE HCL 100 MG/ML IJ SOLN
INTRAMUSCULAR | Status: DC | PRN
  Administered 2017-05-25: 50 mg via INTRAVENOUS
  Administered 2017-05-25: 12.5 mg via INTRAVENOUS
  Administered 2017-05-25: 50 mg via INTRAVENOUS

## 2017-05-25 NOTE — Discharge Instructions (Addendum)
°Colonoscopy °Discharge Instructions ° °Read the instructions outlined below and refer to this sheet in the next few weeks. These discharge instructions provide you with general information on caring for yourself after you leave the hospital. Your doctor may also give you specific instructions. While your treatment has been planned according to the most current medical practices available, unavoidable complications occasionally occur. If you have any problems or questions after discharge, call Dr. Rourk at [phone]. °ACTIVITY °· You may resume your regular activity, but move at a slower pace for the next 24 hours.  °· Take frequent rest periods for the next 24 hours.  °· Walking will help get rid of the air and reduce the bloated feeling in your belly (abdomen).  °· No driving for 24 hours (because of the medicine (anesthesia) used during the test).   °· Do not sign any important legal documents or operate any machinery for 24 hours (because of the anesthesia used during the test).  °NUTRITION °· Drink plenty of fluids.  °· You may resume your normal diet as instructed by your doctor.  °· Begin with a light meal and progress to your normal diet. Heavy or fried foods are harder to digest and may make you feel sick to your stomach (nauseated).  °· Avoid alcoholic beverages for 24 hours or as instructed.  °MEDICATIONS °· You may resume your normal medications unless your doctor tells you otherwise.  °WHAT YOU CAN EXPECT TODAY °· Some feelings of bloating in the abdomen.  °· Passage of more gas than usual.  °· Spotting of blood in your stool or on the toilet paper.  °IF YOU HAD POLYPS REMOVED DURING THE COLONOSCOPY: °· No aspirin products for 7 days or as instructed.  °· No alcohol for 7 days or as instructed.  °· Eat a soft diet for the next 24 hours.  °FINDING OUT THE RESULTS OF YOUR TEST °Not all test results are available during your visit. If your test results are not back during the visit, make an appointment  with your caregiver to find out the results. Do not assume everything is normal if you have not heard from your caregiver or the medical facility. It is important for you to follow up on all of your test results.  °SEEK IMMEDIATE MEDICAL ATTENTION IF: °· You have more than a spotting of blood in your stool.  °· Your belly is swollen (abdominal distention).  °· You are nauseated or vomiting.  °· You have a temperature over 101.  °· You have abdominal pain or discomfort that is severe or gets worse throughout the day.  °EGD °Discharge instructions °Please read the instructions outlined below and refer to this sheet in the next few weeks. These discharge instructions provide you with general information on caring for yourself after you leave the hospital. Your doctor may also give you specific instructions. While your treatment has been planned according to the most current medical practices available, unavoidable complications occasionally occur. If you have any problems or questions after discharge, please call your doctor. °ACTIVITY °· You may resume your regular activity but move at a slower pace for the next 24 hours.  °· Take frequent rest periods for the next 24 hours.  °· Walking will help expel (get rid of) the air and reduce the bloated feeling in your abdomen.  °· No driving for 24 hours (because of the anesthesia (medicine) used during the test).  °· You may shower.  °· Do not sign any important   legal documents or operate any machinery for 24 hours (because of the anesthesia used during the test).  NUTRITION  Drink plenty of fluids.   You may resume your normal diet.   Begin with a light meal and progress to your normal diet.   Avoid alcoholic beverages for 24 hours or as instructed by your caregiver.  MEDICATIONS  You may resume your normal medications unless your caregiver tells you otherwise.  WHAT YOU CAN EXPECT TODAY  You may experience abdominal discomfort such as a feeling of fullness  or gas pains.  FOLLOW-UP  Your doctor will discuss the results of your test with you.  SEEK IMMEDIATE MEDICAL ATTENTION IF ANY OF THE FOLLOWING OCCUR:  Excessive nausea (feeling sick to your stomach) and/or vomiting.   Severe abdominal pain and distention (swelling).   Trouble swallowing.   Temperature over 101 F (37.8 C).   Rectal bleeding or vomiting of blood.    Gastroesophageal Reflux Disease, Adult Normally, food travels down the esophagus and stays in the stomach to be digested. However, when a person has gastroesophageal reflux disease (GERD), food and stomach acid move back up into the esophagus. When this happens, the esophagus becomes sore and inflamed. Over time, GERD can create small holes (ulcers) in the lining of the esophagus. What are the causes? This condition is caused by a problem with the muscle between the esophagus and the stomach (lower esophageal sphincter, or LES). Normally, the LES muscle closes after food passes through the esophagus to the stomach. When the LES is weakened or abnormal, it does not close properly, and that allows food and stomach acid to go back up into the esophagus. The LES can be weakened by certain dietary substances, medicines, and medical conditions, including:  Tobacco use.  Pregnancy.  Having a hiatal hernia.  Heavy alcohol use.  Certain foods and beverages, such as coffee, chocolate, onions, and peppermint.  What increases the risk? This condition is more likely to develop in:  People who have an increased body weight.  People who have connective tissue disorders.  People who use NSAID medicines.  What are the signs or symptoms? Symptoms of this condition include:  Heartburn.  Difficult or painful swallowing.  The feeling of having a lump in the throat.  Abitter taste in the mouth.  Bad breath.  Having a large amount of saliva.  Having an upset or bloated stomach.  Belching.  Chest pain.  Shortness  of breath or wheezing.  Ongoing (chronic) cough or a night-time cough.  Wearing away of tooth enamel.  Weight loss.  Different conditions can cause chest pain. Make sure to see your health care provider if you experience chest pain. How is this diagnosed? Your health care provider will take a medical history and perform a physical exam. To determine if you have mild or severe GERD, your health care provider may also monitor how you respond to treatment. You may also have other tests, including:  An endoscopy toexamine your stomach and esophagus with a small camera.  A test thatmeasures the acidity level in your esophagus.  A test thatmeasures how much pressure is on your esophagus.  A barium swallow or modified barium swallow to show the shape, size, and functioning of your esophagus.  How is this treated? The goal of treatment is to help relieve your symptoms and to prevent complications. Treatment for this condition may vary depending on how severe your symptoms are. Your health care provider may recommend:  Changes to  your diet.  Medicine.  Surgery.  Follow these instructions at home: Diet  Follow a diet as recommended by your health care provider. This may involve avoiding foods and drinks such as: ? Coffee and tea (with or without caffeine). ? Drinks that containalcohol. ? Energy drinks and sports drinks. ? Carbonated drinks or sodas. ? Chocolate and cocoa. ? Peppermint and mint flavorings. ? Garlic and onions. ? Horseradish. ? Spicy and acidic foods, including peppers, chili powder, curry powder, vinegar, hot sauces, and barbecue sauce. ? Citrus fruit juices and citrus fruits, such as oranges, lemons, and limes. ? Tomato-based foods, such as red sauce, chili, salsa, and pizza with red sauce. ? Fried and fatty foods, such as donuts, french fries, potato chips, and high-fat dressings. ? High-fat meats, such as hot dogs and fatty cuts of red and white meats, such  as rib eye steak, sausage, ham, and bacon. ? High-fat dairy items, such as whole milk, butter, and cream cheese.  Eat small, frequent meals instead of large meals.  Avoid drinking large amounts of liquid with your meals.  Avoid eating meals during the 2-3 hours before bedtime.  Avoid lying down right after you eat.  Do not exercise right after you eat. General instructions  Pay attention to any changes in your symptoms.  Take over-the-counter and prescription medicines only as told by your health care provider. Do not take aspirin, ibuprofen, or other NSAIDs unless your health care provider told you to do so.  Do not use any tobacco products, including cigarettes, chewing tobacco, and e-cigarettes. If you need help quitting, ask your health care provider.  Wear loose-fitting clothing. Do not wear anything tight around your waist that causes pressure on your abdomen.  Raise (elevate) the head of your bed 6 inches (15cm).  Try to reduce your stress, such as with yoga or meditation. If you need help reducing stress, ask your health care provider.  If you are overweight, reduce your weight to an amount that is healthy for you. Ask your health care provider for guidance about a safe weight loss goal.  Keep all follow-up visits as told by your health care provider. This is important. Contact a health care provider if:  You have new symptoms.  You have unexplained weight loss.  You have difficulty swallowing, or it hurts to swallow.  You have wheezing or a persistent cough.  Your symptoms do not improve with treatment.  You have a hoarse voice. Get help right away if:  You have pain in your arms, neck, jaw, teeth, or back.  You feel sweaty, dizzy, or light-headed.  You have chest pain or shortness of breath.  You vomit and your vomit looks like blood or coffee grounds.  You faint.  Your stool is bloody or black.  You cannot swallow, drink, or eat. This information is  not intended to replace advice given to you by your health care provider. Make sure you discuss any questions you have with your health care provider.   Peptic Ulcer A peptic ulcer is a painful sore in the lining of your esophagus, stomach, or the first part of your small intestine. You may have pain in the area between your chest and your belly button. The most common causes of an ulcer are:  An infection.  Using certain pain medicines too often or too much.  Follow these instructions at home:  Avoid alcohol.  Avoid caffeine.  Do not use any tobacco products. These include cigarettes, chewing tobacco,  and e-cigarettes. If you need help quitting, ask your doctor.  Take over-the-counter and prescription medicines only as told by your doctor. Do not stop or change your medicines unless you talk with your doctor about it first.  Keep all follow-up visits as told by your doctor. This is important. Contact a doctor if:  You do not get better in 7 days after you start treatment.  You keep having an upset stomach (indigestion) or heartburn. Get help right away if:  You have sudden, sharp pain in your belly (abdomen).  You have lasting belly pain.  You have bloody poop (stool) or black, tarry poop.  You throw up (vomit) blood. It may look like coffee grounds.  You feel light-headed or feel like you may pass out (faint).  You get weak.  You get sweaty or feel sticky and cold to the touch (clammy). This information is not intended to replace advice given to you by your health care provider. Make sure you discuss any questions you have with your health care provider. Document Released: 04/15/2009 Document Revised: 06/05/2015 Document Reviewed: 10/20/2014 Elsevier Interactive Patient Education  2018 [employer].   Colon Polyps Polyps are tissue growths inside the body. Polyps can grow in many places, including the large intestine (colon). A polyp may be a round bump or a  mushroom-shaped growth. You could have one polyp or several. Most colon polyps are noncancerous (benign). However, some colon polyps can become cancerous over time. What are the causes? The exact cause of colon polyps is not known. What increases the risk? This condition is more likely to develop in people who:  Have a family history of colon cancer or colon polyps.  Are older than 19 or older than 45 if they are African American.  Have inflammatory bowel disease, such as ulcerative colitis or Crohn disease.  Are overweight.  Smoke cigarettes.  Do not get enough exercise.  Drink too much alcohol.  Eat a diet that is: ? High in fat and red meat. ? Low in fiber.  Had childhood cancer that was treated with abdominal radiation.  What are the signs or symptoms? Most polyps do not cause symptoms. If you have symptoms, they may include:  Blood coming from your rectum when having a bowel movement.  Blood in your stool.The stool may look dark red or black.  A change in bowel habits, such as constipation or diarrhea.  How is this diagnosed? This condition is diagnosed with a colonoscopy. This is a procedure that uses a lighted, flexible scope to look at the inside of your colon. How is this treated? Treatment for this condition involves removing any polyps that are found. Those polyps will then be tested for cancer. If cancer is found, your health care provider will talk to you about options for colon cancer treatment. Follow these instructions at home: Diet  Eat plenty of fiber, such as fruits, vegetables, and whole grains.  Eat foods that are high in calcium and vitamin D, such as milk, cheese, yogurt, eggs, liver, fish, and broccoli.  Limit foods high in fat, red meats, and processed meats, such as hot dogs, sausage, bacon, and lunch meats.  Maintain a healthy weight, or lose weight if recommended by your health care provider. General instructions  Do not smoke  cigarettes.  Do not drink alcohol excessively.  Keep all follow-up visits as told by your health care provider. This is important. This includes keeping regularly scheduled colonoscopies. Talk to your health care provider  about when you need a colonoscopy.  Exercise every day or as told by your health care provider. Contact a health care provider if:  You have new or worsening bleeding during a bowel movement.  You have new or increased blood in your stool.  You have a change in bowel habits.  You unexpectedly lose weight. This information is not intended to replace advice given to you by your health care provider. Make sure you discuss any questions you have with your health care provider. Document Released: 10/16/2003 Document Revised: 06/27/2015 Document Reviewed: 12/10/2014 Elsevier Interactive Patient Education  [employer].   Diverticulosis Diverticulosis is a condition that develops when small pouches (diverticula) form in the wall of the large intestine (colon). The colon is where water is absorbed and stool is formed. The pouches form when the inside layer of the colon pushes through weak spots in the outer layers of the colon. You may have a few pouches or many of them. What are the causes? The cause of this condition is not known. What increases the risk? The following factors may make you more likely to develop this condition:  Being older than age 25. Your risk for this condition increases with age. Diverticulosis is rare among people younger than age 73. By age 64, many people have it.  Eating a low-fiber diet.  Having frequent constipation.  Being overweight.  Not getting enough exercise.  Smoking.  Taking over-the-counter pain medicines, like aspirin and ibuprofen.  Having a family history of diverticulosis.  What are the signs or symptoms? In most people, there are no symptoms of this condition. If you do have symptoms, they may  include:  Bloating.  Cramps in the abdomen.  Constipation or diarrhea.  Pain in the lower left side of the abdomen.  How is this diagnosed? This condition is most often diagnosed during an exam for other colon problems. Because diverticulosis usually has no symptoms, it often cannot be diagnosed independently. This condition may be diagnosed by:  Using a flexible scope to examine the colon (colonoscopy).  Taking an X-ray of the colon after dye has been put into the colon (barium enema).  Doing a CT scan.  How is this treated? You may not need treatment for this condition if you have never developed an infection related to diverticulosis. If you have had an infection before, treatment may include:  Eating a high-fiber diet. This may include eating more fruits, vegetables, and grains.  Taking a fiber supplement.  Taking a live bacteria supplement (probiotic).  Taking medicine to relax your colon.  Taking antibiotic medicines.  Follow these instructions at home:  Drink 6-8 glasses of water or more each day to prevent constipation.  Try not to strain when you have a bowel movement.  If you have had an infection before: ? Eat more fiber as directed by your health care provider or your diet and nutrition specialist (dietitian). ? Take a fiber supplement or probiotic, if your health care provider approves.  Take over-the-counter and prescription medicines only as told by your health care provider.  If you were prescribed an antibiotic, take it as told by your health care provider. Do not stop taking the antibiotic even if you start to feel better.  Keep all follow-up visits as told by your health care provider. This is important. Contact a health care provider if:  You have pain in your abdomen.  You have bloating.  You have cramps.  You have not had a  bowel movement in 3 days. Get help right away if:  Your pain gets worse.  Your bloating becomes very bad.  You  have a fever or chills, and your symptoms suddenly get worse.  You vomit.  You have bowel movements that are bloody or black.  You have bleeding from your rectum. Summary  Diverticulosis is a condition that develops when small pouches (diverticula) form in the wall of the large intestine (colon).  You may have a few pouches or many of them.  This condition is most often diagnosed during an exam for other colon problems.  If you have had an infection related to diverticulosis, treatment may include increasing the fiber in your diet, taking supplements, or taking medicines. This information is not intended to replace advice given to you by your health care provider. Make sure you discuss any questions you have with your health care provider. Document Released: 10/17/2003 Document Revised: 12/09/2015 Document Reviewed: 12/09/2015 Elsevier Interactive Patient Education  2017 [employer]   Peptic ulcer disease and GERD information provided  Begin Protonix 40 mg twice daily  Avoid nonsteroidal agents like Aleve and  Advil; limit aspirin use.  Further recommendations to follow pending review of pathology report  Colon polyp and diverticulosis information provided  Office visit with [place] in 3 months  August 12, 2017 at 10:30

## 2017-05-25 NOTE — Op Note (Signed)
[facility] Patient Name: Phillip Pace Procedure Date: 05/25/2017 11:30 AM MRN: [id number] Date of Birth: 11/29/1945 Attending MD: Norvel Richards , MD CSN: [id number] Age: 72 Admit Type: Outpatient Procedure:                Colonoscopy Indications:              High risk colon cancer surveillance: Personal                            history of colonic polyps Providers:                Norvel Richards, MD, Rosina Lowenstein, RN, Randa Spike, Technician Referring MD:              Medicines:                Midazolam 6 mg IV, Meperidine 112.5 mg IV,                            Ondansetron 4 mg IV Complications:            No immediate complications. Estimated Blood Loss:     Estimated blood loss was minimal. Procedure:                Pre-Anesthesia Assessment:                           - Prior to the procedure, a History and Physical                            was performed, and patient medications and                            allergies were reviewed. The patient's tolerance of                            previous anesthesia was also reviewed. The risks                            and benefits of the procedure and the sedation                            options and risks were discussed with the patient.                            All questions were answered, and informed consent                            was obtained. Prior Anticoagulants: The patient has                            taken no previous anticoagulant or antiplatelet  agents. ASA Grade Assessment: II - A patient with                            mild systemic disease. After reviewing the risks                            and benefits, the patient was deemed in                            satisfactory condition to undergo the procedure.                           After obtaining informed consent, the colonoscope                            was passed under direct  vision. Throughout the                            procedure, the patient's blood pressure, pulse, and                            oxygen saturations were monitored continuously. The                            [id number]) scope was introduced through                            the anus and advanced to the the cecum, identified                            by appendiceal orifice and ileocecal valve. The                            colonoscopy was performed without difficulty. The                            patient tolerated the procedure well. The quality                            of the bowel preparation was adequate. The                            ileocecal valve, appendiceal orifice, and rectum                            were photographed. The colonoscopy was performed                            without difficulty. The patient tolerated the                            procedure well. Scope In: 11:34:58 AM Scope Out: 11:49:46 AM Scope Withdrawal Time: 0 hours 10 minutes 41 seconds  Total Procedure Duration: 0 hours 14  minutes 48 seconds  Findings:      The perianal and digital rectal examinations were normal.      A 5 mm polyp was found in the ascending colon. The polyp was sessile.       The polyp was removed with a cold snare. Resection and retrieval were       complete. Estimated blood loss was minimal.      Scattered medium-mouthed diverticula were found in the sigmoid colon and       descending colon. 2 areas of tattooing mucosa seen in the hepatic and       splenic flexures. Overlying mucosa appeared normal.      The exam was otherwise without abnormality on direct and retroflexion       views. Impression:               - One 5 mm polyp in the ascending colon, removed                            with a cold snare. Resected and retrieved.                           - Diverticulosis in the sigmoid colon and in the                            descending colon.                            - The examination was otherwise normal on direct                            and retroflexion views. Moderate Sedation:      Moderate (conscious) sedation was administered by the endoscopy nurse       and supervised by the endoscopist. The following parameters were       monitored: oxygen saturation, heart rate, blood pressure, respiratory       rate, EKG, adequacy of pulmonary ventilation, and response to care.       Total physician intraservice time was 39 minutes. Recommendation:           - Patient has a contact number available for                            emergencies. The signs and symptoms of potential                            delayed complications were discussed with the                            patient. Return to normal activities tomorrow.                            Written discharge instructions were provided to the                            patient.                           -  Resume previous diet.                           - Continue present medications.                           - Repeat colonoscopy date to be determined after                            pending pathology results are reviewed for                            surveillance based on pathology results.                           - Return to GI office in 3 months. See EGD report. Procedure Code(s):        --- Professional ---                           [phone], Colonoscopy, flexible; with removal of                            tumor(s), polyp(s), or other lesion(s) by snare                            technique                           G0500, Moderate sedation services provided by the                            same physician or other qualified health care                            professional performing a gastrointestinal                            endoscopic service that sedation supports,                            requiring the presence of an independent trained                            observer to  assist in the monitoring of the                            patient's level of consciousness and physiological                            status; initial 15 minutes of intra-service time;                            patient age 55 years or older (additional time 69  be reported with [phone], as appropriate)                           [phone], Moderate sedation services provided by the                            same physician or other qualified health care                            professional performing the diagnostic or                            therapeutic service that the sedation supports,                            requiring the presence of an independent trained                            observer to assist in the monitoring of the                            patient's level of consciousness and physiological                            status; each additional 15 minutes intraservice                            time (List separately in addition to code for                            primary service)                           [phone], Moderate sedation services provided by the                            same physician or other qualified health care                            professional performing the diagnostic or                            therapeutic service that the sedation supports,                            requiring the presence of an independent trained                            observer to assist in the monitoring of the                            patient's level of consciousness and physiological                            status; each  additional 15 minutes intraservice                            time (List separately in addition to code for                            primary service) Diagnosis Code(s):        --- Professional ---                           Z86.010, Personal history of colonic polyps                           D12.2, Benign neoplasm of  ascending colon                           K57.30, Diverticulosis of large intestine without                            perforation or abscess without bleeding CPT copyright 2017 [employer]. All rights reserved. The codes documented in this report are preliminary and upon coder review may  be revised to meet current compliance requirements. Cristopher Estimable. Huntleigh Doolen, MD Norvel Richards, MD 05/25/2017 12:01:01 PM This report has been signed electronically. Number of Addenda: 0

## 2017-05-25 NOTE — Op Note (Signed)
[facility] Patient Name: Phillip Pace Procedure Date: 05/25/2017 10:47 AM MRN: [id number] Date of Birth: Aug 16, 1945 Attending MD: Norvel Richards , MD CSN: [id number] Age: 72 Admit Type: Outpatient Procedure:                Upper GI endoscopy Indications:              Dysphagia Providers:                Norvel Richards, MD, Rosina Lowenstein, RN, Randa Spike, Technician Referring MD:              Medicines:                Midazolam 4 mg IV, Meperidine 149 mg IV Complications:            No immediate complications. Estimated Blood Loss:     Estimated blood loss was minimal. Procedure:                Pre-Anesthesia Assessment:                           - Prior to the procedure, a History and Physical                            was performed, and patient medications and                            allergies were reviewed. The patient's tolerance of                            previous anesthesia was also reviewed. The risks                            and benefits of the procedure and the sedation                            options and risks were discussed with the patient.                            All questions were answered, and informed consent                            was obtained. Prior Anticoagulants: The patient has                            taken no previous anticoagulant or antiplatelet                            agents. ASA Grade Assessment: II - A patient with                            mild systemic disease. After reviewing the risks  and benefits, the patient was deemed in                            satisfactory condition to undergo the procedure.                           After obtaining informed consent, the endoscope was                            passed under direct vision. Throughout the                            procedure, the patient's blood pressure, pulse, and                            oxygen  saturations were monitored continuously. The                            [id number] [phone]) scope was introduced through the                            mouth, and advanced to the second part of duodenum. Scope In: 11:19:08 AM Scope Out: 11:29:04 AM Total Procedure Duration: 0 hours 9 minutes 56 seconds  Findings:      A low-grade of narrowing Schatzki ring was found at the gastroesophageal       junction.      Mucosal changes were found at the gastroesophageal junction. (2) 3 cm       "tongues" of salmon-colored epithelium coming up above the GE junction.       Distal esophageal erosions within 5 mm of GE junction. No tumor seen. .      A small hiatal hernia was present.      Two non-bleeding cratered gastric ulcers with no stigmata of bleeding       were found in the prepyloric region of the stomach. .      A few localized erosions without bleeding were found in the duodenal       bulb.      The exam was otherwise without abnormality. The scope was withdrawn.       Dilation was performed with a Maloney dilator with mild resistance at 56       Fr. The dilation site was examined following endoscope reinsertion and       showed no change. Estimated blood loss: none. Salmon-colored epithelium       biopsied with a cold forceps for histology. Estimated blood loss was       minimal. Finally, gastric ulcers were biopsied with a cold forceps for       histology. Estimated blood loss was minimal Impression:               - Low-grade of narrowing Schatzki ring.                           - Mucosal changes in the esophagus. Query Barrett's                            esophagus. Dilated. Biopsied.                           -  Small hiatal hernia.                           - Non-bleeding gastric ulcers with no stigmata of                            bleeding. Biopsied.                           - Duodenal erosions without bleeding.                           - The examination was otherwise  normal. Moderate Sedation:      Moderate (conscious) sedation was administered by the endoscopy nurse       and supervised by the endoscopist. The following parameters were       monitored: oxygen saturation, heart rate, blood pressure, respiratory       rate, EKG, adequacy of pulmonary ventilation, and response to care.       Total physician intraservice time was 19 minutes. Recommendation:           - Patient has a contact number available for                            emergencies. The signs and symptoms of potential                            delayed complications were discussed with the                            patient. Return to normal activities tomorrow.                            Written discharge instructions were provided to the                            patient.                           - Resume previous diet. Avoid nonsteroidal agents.                            Begin Protonix 40 mg twice daily. Follow up on                            pathology.                           - Continue present medications.                           - Repeat upper endoscopy in 3 years for                            surveillance.                           -  Return to GI clinic in 3 months. See colonoscopy                            report. Procedure Code(s):        --- Professional ---                           [phone], Esophagogastroduodenoscopy, flexible,                            transoral; with biopsy, single or multiple                           43450, Dilation of esophagus, by unguided sound or                            bougie, single or multiple passes                           G0500, Moderate sedation services provided by the                            same physician or other qualified health care                            professional performing a gastrointestinal                            endoscopic service that sedation supports,                            requiring the presence  of an independent trained                            observer to assist in the monitoring of the                            patient's level of consciousness and physiological                            status; initial 15 minutes of intra-service time;                            patient age 76 years or older (additional time may                            be reported with [phone], as appropriate) Diagnosis Code(s):        --- Professional ---                           K22.2, Esophageal obstruction                           K22.8, Other specified diseases of esophagus  K44.9, Diaphragmatic hernia without obstruction or                            gangrene                           K25.9, Gastric ulcer, unspecified as acute or                            chronic, without hemorrhage or perforation                           K26.9, Duodenal ulcer, unspecified as acute or                            chronic, without hemorrhage or perforation                           R13.10, Dysphagia, unspecified CPT copyright 2017 American Medical Association. All rights reserved. The codes documented in this report are preliminary and upon coder review may  be revised to meet current compliance requirements. Cristopher Estimable. Prophet Renwick, MD Norvel Richards, MD 05/25/2017 11:57:08 AM This report has been signed electronically. Number of Addenda: 0

## 2017-05-25 NOTE — H&P (Signed)
@LOGO @   Primary Care Physician:  Lucia Gaskins, MD Primary Gastroenterologist:  Dr. Gala Romney  Pre-Procedure History & Physical: HPI:  Phillip Pace is a 72 y.o. male here for for surveillance colonoscopy and further evaluation of dysphagia via EGD. Positive family history of colon cancer in both his parents. 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  Past Medical History:  Diagnosis Date  . Arthritis   . CHF (congestive heart failure) ([place])   . COPD (chronic obstructive pulmonary disease) ([place])   . Coronary artery disease   . Hypertension   . MI (myocardial infarction) ([place])    x 5  . Presence of stent in artery    13 stents    Past Surgical History:  Procedure Laterality Date  . FRACTURE SURGERY     collar bone  . HERNIA REPAIR     x 2  . stents 13      Prior to Admission medications   Medication Sig Start Date End Date Taking? Authorizing Provider  aspirin 325 MG EC tablet Take 325 mg by mouth daily.    Yes [provider]  b complex vitamins capsule Take 1 capsule by mouth daily.   Yes [provider]  Cholecalciferol (VITAMIN D3) 5000 units CAPS Take 10,000 Units by mouth daily.   Yes [provider]  lisinopril (PRINIVIL,ZESTRIL) 5 MG tablet Take 5 mg by mouth every evening.   Yes [provider]  magnesium oxide (MAG-OX) 400 MG tablet Take 400 mg by mouth daily.   Yes [provider]  metoprolol succinate (TOPROL-XL) 25 MG 24 hr tablet Take 1 tablet (25 mg total) by mouth daily. 07/24/14  Yes Dondiego, Richard, MD  naproxen (NAPROSYN) 500 MG tablet Take 500 mg by mouth 2 (two) times daily as needed for moderate pain.   Yes [provider]  niacin (SLO-NIACIN) 500 MG tablet Take 500 mg by mouth daily.    Yes [provider]  polyethylene glycol-electrolytes (TRILYTE) 420 g solution Take 4,000 mLs by mouth as directed. 04/07/17  Yes Salsabeel Gorelick, Cristopher Estimable, MD   simvastatin (ZOCOR) 40 MG tablet Take 40 mg by mouth daily.   Yes [provider]  tamsulosin (FLOMAX) 0.4 MG CAPS capsule Take 0.4 mg by mouth daily.  11/25/15  Yes [provider]  NITROSTAT 0.4 MG SL tablet Take 0.4 mg by mouth as needed for chest pain. Repeat in 5 minutes if no relief for up to 3 total doses as needed for chest pain. 07/06/14   [provider]    Allergies as of 04/07/2017 - Review Complete 04/07/2017  Allergen Reaction Noted  . Penicillins Shortness Of Breath, Itching, and Swelling 07/20/2014  . Shellfish allergy Itching and Swelling 07/20/2014    Family History  Problem Relation Age of Onset  . Cancer - Colon Mother   . CAD Mother   . Colon cancer Father   . Gastric cancer Neg Hx   . Esophageal cancer Neg Hx     Social History   Socioeconomic History  . Marital status: Single    Spouse name: Not on file  . Number of children: Not on file  . Years of education: Not on file  . Highest education level: Not on file  Occupational History  . Not on file  Social Needs  . Financial resource strain: Not on file  . Food insecurity:    Worry: Not on file    Inability: Not on file  .  Transportation needs:    Medical: Not on file    Non-medical: Not on file  Tobacco Use  . Smoking status: Former Smoker    Last attempt to quit: 02/02/1993    Years since quitting: 24.3  . Smokeless tobacco: Never Used  Substance and Sexual Activity  . Alcohol use: No    Alcohol/week: 0.0 oz  . Drug use: No  . Sexual activity: Not on file  Lifestyle  . Physical activity:    Days per week: Not on file    Minutes per session: Not on file  . Stress: Not on file  Relationships  . Social connections:    Talks on phone: Not on file    Gets together: Not on file    Attends religious service: Not on file    Active member of club or organization: Not on file    Attends meetings of clubs or organizations: Not on file    Relationship status: Not on file   . Intimate partner violence:    Fear of current or ex partner: Not on file    Emotionally abused: Not on file    Physically abused: Not on file    Forced sexual activity: Not on file  Other Topics Concern  . Not on file  Social History Narrative  . Not on file    Review of Systems: See HPI, otherwise negative ROS  Physical Exam: BP (!) 141/96   Pulse 75   Temp 98.4 F (36.9 C) (Oral)   Resp 17   Ht 6\' 3"  (1.905 m)   Wt 202 lb (91.6 kg)   SpO2 98%   BMI 25.25 kg/m  General:   Alert,  Well-developed, well-nourished, pleasant and cooperative in NAD Neck:  Supple; no masses or thyromegaly. No significant cervical adenopathy. Lungs:  Clear throughout to auscultation.   No wheezes, crackles, or rhonchi. No acute distress. Heart:  Regular rate and rhythm; no murmurs, clicks, rubs,  or gallops. Abdomen: Non-distended, normal bowel sounds.  Soft and nontender without appreciable mass or hepatosplenomegaly.  Pulses:  Normal pulses noted. Extremities:  Without clubbing or edema.  Impression:  Pleasant 72 year old gentleman with esophageal dysphagia. Disturbing positive family history of colon cancer in both parents. Personal history of tubulovillous adenoma.  Recommendations: I will offer the patient a diagnostic EGD with possible esophageal dilation as feasible/appropriate today. In addition, surveillance colonoscopy also offered as above. The risks, benefits, limitations, imponderables and alternatives regarding both EGD and colonoscopy have been reviewed with the patient. Questions have been answered. All parties agreeable.      Notice: This dictation was prepared with Dragon dictation along with smaller phrase technology. Any transcriptional errors that result from this process are unintentional and may not be corrected upon review.

## 2017-05-29 ENCOUNTER — Encounter: Payer: Self-pay | Admitting: Internal Medicine

## 2017-05-31 ENCOUNTER — Telehealth: Payer: Self-pay

## 2017-05-31 NOTE — Telephone Encounter (Signed)
Per RMR- Send letter to patient.  Send copy of letter with path to referring provider and PCP.   Please send information on Barrett's esophagus.  Patient needs an office visit set up repeat EGD for ulcer in 3 months

## 2017-06-01 NOTE — Telephone Encounter (Signed)
OV made °

## 2017-06-01 NOTE — Telephone Encounter (Signed)
Letter mailed to pt with pt education.

## 2017-06-03 ENCOUNTER — Encounter (HOSPITAL_COMMUNITY): Payer: Self-pay | Admitting: Internal Medicine

## 2017-06-21 ENCOUNTER — Telehealth: Payer: Self-pay

## 2017-06-21 DIAGNOSIS — K22719 Barrett's esophagus with dysplasia, unspecified: Secondary | ICD-10-CM

## 2017-06-21 NOTE — Telephone Encounter (Signed)
Pt called and said his RX for Protonix was written to take once daily the day of discharge of his TCS 05/25/17 and he knows RMR said take Protonix BID. He would like a nex rx sent to his pharmacy [facility] for Protonix bid. On Pts discharge sheet, Protonix is suppose to be taken twice daily.

## 2017-06-24 MED ORDER — PANTOPRAZOLE SODIUM 40 MG PO TBEC: 40.0000 mg | DELAYED_RELEASE_TABLET | Freq: Two times a day (BID) | ORAL | 2 refills | Status: DC

## 2017-06-24 NOTE — Telephone Encounter (Signed)
Correct, pt notified that medication was sent in.

## 2017-06-24 NOTE — Telephone Encounter (Signed)
Pt notified of results

## 2017-06-24 NOTE — Addendum Note (Signed)
Addended by: Gordy Levan, Carrick Rijos A on: 06/24/2017 03:43 PM   Modules accepted: Orders

## 2017-06-24 NOTE — Telephone Encounter (Signed)
Sent!

## 2017-07-12 ENCOUNTER — Ambulatory Visit: Payer: Medicare HMO | Admitting: Nurse Practitioner

## 2017-08-12 ENCOUNTER — Ambulatory Visit: Payer: Medicare HMO | Admitting: Nurse Practitioner

## 2017-08-12 ENCOUNTER — Encounter: Payer: Self-pay | Admitting: Nurse Practitioner

## 2017-08-12 VITALS — BP 160/81 | HR 68 | Temp 97.0°F | Ht 75.0 in | Wt 210.2 lb

## 2017-08-12 DIAGNOSIS — Z86010 Personal history of colonic polyps: Secondary | ICD-10-CM | POA: Diagnosis not present

## 2017-08-12 DIAGNOSIS — K2270 Barrett's esophagus without dysplasia: Secondary | ICD-10-CM | POA: Diagnosis not present

## 2017-08-12 DIAGNOSIS — R1310 Dysphagia, unspecified: Secondary | ICD-10-CM

## 2017-08-12 DIAGNOSIS — R1319 Other dysphagia: Secondary | ICD-10-CM

## 2017-08-12 NOTE — Patient Instructions (Signed)
1. Continue taking Protonix.  He will likely need to take this indefinitely. 2. Notify us if any difficulty swallowing or worsening symptoms. 3. Follow-up in 1 year. 4. Call us if you have any questions or concerns  At [facility] we value your feedback. You may receive a survey about your visit today. Please share your experience as we strive to create trusting relationships with our patients to provide genuine, compassionate, quality care.  It was great to see all three of you today!  I hope you have a wonderful summer!!

## 2017-08-12 NOTE — Progress Notes (Signed)
Referring Provider: Lucia Gaskins, MD Primary Care Physician:  Lucia Gaskins, MD Primary GI:  Dr. Gala Romney  Chief Complaint  Patient presents with  . Follow-up    pp. He is doing well. trouble swallowing is no longer an issue    HPI:   Phillip Pace is a 72 y.o. male who presents for follow-up on colon polyp history and dysphasia.  He was last seen in our office 04/07/2017.  Previous colonoscopy in [place] in 2014 which found tubular adenoma and tubulovillous adenoma polyps and recommended 3-year repeat (2017).  He was brought into the office to set up his procedure because of complaints of dysphasia.  At his last visit he was doing well overall.  Notes minimal abdominal pain if constipated.  Hemorrhoids generally do not bother him.  Began having solid food dysphagia about 2 years prior and is stable with food typically passing with time and fluids.  Patient was done aphasia.  Occasional dyspnea related to COPD but at baseline.  No other GI symptoms.  Recommended colonoscopy and upper endoscopy with possible dilation, follow-up in 3 months.  Colonoscopy was completed 05/25/2017 which found a single 5 mm polyp in the ascending colon status post resection, diverticulosis in the sigmoid and descending colons, otherwise normal.  Surgical pathology found the polyps to be tubular adenoma.  Recommended resume previous diet, continue medications, repeat colonoscopy in 5 years if overall health permits.  EGD completed the same day found low-grade of narrowing Schatzki's ring status post dilation, mucosal changes in the esophagus query Barrett's status post biopsy, small hiatal hernia, nonbleeding gastric ulcers status post biopsy, duodenal erosions without bleeding, otherwise normal.  Surgical pathology found the esophageal biopsies to be consistent with Barrett's esophagus and the gastric biopsies to be ulcerative changes without H. pylori.  Recommended continue current medications, repeat EGD in 3  years for surveillance.  Today he states he's doing well overall. He is accompanied by his wife and service animal. Swallowing much better, breathing much better. Denies any further odynophagia. Denies abdominal pain, N/V, hematochezia, melena. Denies chest pain, dyspnea, dizziness, lightheadedness, syncope, near syncope. Denies any other upper or lower GI symptoms.  Past Medical History:  Diagnosis Date  . Arthritis   . CHF (congestive heart failure) ([place])   . COPD (chronic obstructive pulmonary disease) ([place])   . Coronary artery disease   . Hypertension   . MI (myocardial infarction) ([place])    x 5  . Presence of stent in artery    13 stents    Past Surgical History:  Procedure Laterality Date  . COLONOSCOPY N/A 05/25/2017   Procedure: COLONOSCOPY;  Surgeon: Daneil Dolin, MD;  [facility];  Service: Endoscopy;  Laterality: N/A;  12:00pm  . ESOPHAGOGASTRODUODENOSCOPY N/A 05/25/2017   Procedure: ESOPHAGOGASTRODUODENOSCOPY (EGD);  Surgeon: Daneil Dolin, MD;  [facility];  Service: Endoscopy;  Laterality: N/A;  . FRACTURE SURGERY     collar bone  . HERNIA REPAIR     x 2  . MALONEY DILATION N/A 05/25/2017   Procedure: Venia Minks DILATION;  Surgeon: Daneil Dolin, MD;  [facility];  Service: Endoscopy;  Laterality: N/A;  . stents 13      Current Outpatient Medications  Medication Sig Dispense Refill  . aspirin 325 MG EC tablet Take 325 mg by mouth daily.     Marland Kitchen b complex vitamins capsule Take 1 capsule by mouth daily.    . Cholecalciferol (VITAMIN D3) 5000 units CAPS  Take 10,000 Units by mouth daily.    Marland Kitchen lisinopril (PRINIVIL,ZESTRIL) 5 MG tablet Take 5 mg by mouth every evening.    . magnesium oxide (MAG-OX) 400 MG tablet Take 400 mg by mouth daily.    . metoprolol succinate (TOPROL-XL) 25 MG 24 hr tablet Take 1 tablet (25 mg total) by mouth daily. 30 tablet 6  . naproxen (NAPROSYN) 500 MG tablet Take 500 mg by mouth 2 (two) times daily as  needed for moderate pain.    . niacin (SLO-NIACIN) 500 MG tablet Take 500 mg by mouth daily.     Marland Kitchen NITROSTAT 0.4 MG SL tablet Take 0.4 mg by mouth as needed for chest pain. Repeat in 5 minutes if no relief for up to 3 total doses as needed for chest pain.  5  . pantoprazole (PROTONIX) 40 MG tablet Take 1 tablet (40 mg total) by mouth 2 (two) times daily before a meal. 60 tablet 2  . simvastatin (ZOCOR) 40 MG tablet Take 40 mg by mouth daily.    . tamsulosin (FLOMAX) 0.4 MG CAPS capsule Take 0.4 mg by mouth daily.      No current facility-administered medications for this visit.     Allergies as of 08/12/2017 - Review Complete 08/12/2017  Allergen Reaction Noted  . Penicillins Shortness Of Breath, Itching, Swelling, and Other (See Comments) 07/20/2014  . Shellfish allergy Itching and Swelling 07/20/2014    Family History  Problem Relation Age of Onset  . Cancer - Colon Mother   . CAD Mother   . Colon cancer Father   . Gastric cancer Neg Hx   . Esophageal cancer Neg Hx     Social History   Socioeconomic History  . Marital status: Single    Spouse name: Not on file  . Number of children: Not on file  . Years of education: Not on file  . Highest education level: Not on file  Occupational History  . Not on file  Social Needs  . Financial resource strain: Not on file  . Food insecurity:    Worry: Not on file    Inability: Not on file  . Transportation needs:    Medical: Not on file    Non-medical: Not on file  Tobacco Use  . Smoking status: Former Smoker    Last attempt to quit: 02/02/1993    Years since quitting: 24.5  . Smokeless tobacco: Never Used  Substance and Sexual Activity  . Alcohol use: No    Alcohol/week: 0.0 oz  . Drug use: No  . Sexual activity: Not on file  Lifestyle  . Physical activity:    Days per week: Not on file    Minutes per session: Not on file  . Stress: Not on file  Relationships  . Social connections:    Talks on phone: Not on file     Gets together: Not on file    Attends religious service: Not on file    Active member of club or organization: Not on file    Attends meetings of clubs or organizations: Not on file    Relationship status: Not on file  Other Topics Concern  . Not on file  Social History Narrative  . Not on file    Review of Systems: General: Negative for anorexia, weight loss, fever, chills, fatigue, weakness. ENT: Negative for hoarseness, difficulty swallowing. CV: Negative for chest pain, angina, palpitations, peripheral edema.  Respiratory: Negative for dyspnea at rest, cough, sputum, wheezing.  GI:  See history of present illness. Endo: Negative for unusual weight change.  Heme: Negative for bruising or bleeding. Allergy: Negative for rash or hives.   Physical Exam: BP (!) 160/81   Pulse 68   Temp (!) 97 F (36.1 C) (Oral)   Ht 6\' 3"  (1.905 m)   Wt 210 lb 3.2 oz (95.3 kg)   BMI 26.27 kg/m  General:   Alert and oriented. Pleasant and cooperative. Well-nourished and well-developed.  Eyes:  Without icterus, sclera clear and conjunctiva pink.  Ears:  Normal auditory acuity. Cardiovascular:  S1, S2 present without murmurs appreciated. Extremities without clubbing or edema. Respiratory:  Clear to auscultation bilaterally. No wheezes, rales, or rhonchi. No distress.  Gastrointestinal:  +BS, soft, non-tender and non-distended. No HSM noted. No guarding or rebound. No masses appreciated.  Rectal:  Deferred  Musculoskalatal:  Symmetrical without gross deformities. Skin:  Intact without significant lesions or rashes. Neurologic:  Alert and oriented x4;  grossly normal neurologically. Psych:  Alert and cooperative. Normal mood and affect. Heme/Lymph/Immune: No excessive bruising noted.    08/12/2017 10:42 AM   Disclaimer: This note was dictated with voice recognition software. Similar sounding words can inadvertently be transcribed and may not be corrected upon review.

## 2017-08-12 NOTE — Assessment & Plan Note (Signed)
Dysphasia significantly improved/essentially resolved after dilation.  He is very satisfied with the results.  Still on PPI.  Return for follow-up in 1 year.  Call if any problems before then.

## 2017-08-12 NOTE — Assessment & Plan Note (Signed)
History of tubular adenoma and tubulovillous adenoma on previous colonoscopy.  Colonoscopy just recently completed found a single polyp that was tubular adenoma.  Family history of colon cancer in his mother and his father.  Recommended 5-year repeat exam due to high risk.  Follow-up in 1 year.  He is on recall list for repeat colonoscopy in 2024.

## 2017-08-12 NOTE — Progress Notes (Signed)
cc'ed to pcp °

## 2017-08-12 NOTE — Assessment & Plan Note (Signed)
Diagnosed Barrett's esophagus on biopsy during EGD.  Discussed results with the patient.  Explained need for indefinite PPI.  He is on recall for 3-year repeat endoscopy.  Follow-up in 1 year.

## 2017-09-13 ENCOUNTER — Other Ambulatory Visit: Payer: Self-pay | Admitting: Nurse Practitioner

## 2017-09-13 DIAGNOSIS — K22719 Barrett's esophagus with dysplasia, unspecified: Secondary | ICD-10-CM

## 2017-10-26 ENCOUNTER — Other Ambulatory Visit (HOSPITAL_COMMUNITY): Payer: Self-pay | Admitting: Family Medicine

## 2017-10-26 ENCOUNTER — Ambulatory Visit (HOSPITAL_COMMUNITY)
Admission: RE | Admit: 2017-10-26 | Discharge: 2017-10-26 | Disposition: A | Discharge status: Home / Self Care | Payer: Medicare HMO | Source: Ambulatory Visit | Attending: Family Medicine | Admitting: Family Medicine

## 2017-10-26 DIAGNOSIS — S60551A Superficial foreign body of right hand, initial encounter: Secondary | ICD-10-CM | POA: Insufficient documentation

## 2017-10-26 DIAGNOSIS — M25521 Pain in right elbow: Secondary | ICD-10-CM

## 2017-10-26 DIAGNOSIS — X58XXXA Exposure to other specified factors, initial encounter: Secondary | ICD-10-CM | POA: Diagnosis not present

## 2017-10-26 DIAGNOSIS — M25421 Effusion, right elbow: Secondary | ICD-10-CM | POA: Insufficient documentation

## 2017-10-26 DIAGNOSIS — M79641 Pain in right hand: Secondary | ICD-10-CM

## 2017-10-26 DIAGNOSIS — M25531 Pain in right wrist: Secondary | ICD-10-CM | POA: Diagnosis not present

## 2018-08-01 ENCOUNTER — Encounter: Payer: Self-pay | Admitting: Internal Medicine

## 2018-09-19 ENCOUNTER — Other Ambulatory Visit: Payer: Self-pay | Admitting: Nurse Practitioner

## 2018-09-19 DIAGNOSIS — K22719 Barrett's esophagus with dysplasia, unspecified: Secondary | ICD-10-CM

## 2019-07-04 ENCOUNTER — Ambulatory Visit (HOSPITAL_COMMUNITY)
Admission: RE | Admit: 2019-07-04 | Discharge: 2019-07-04 | Disposition: A | Discharge status: Home / Self Care | Payer: Medicare HMO | Source: Ambulatory Visit | Attending: Family Medicine | Admitting: Family Medicine

## 2019-07-04 ENCOUNTER — Other Ambulatory Visit: Payer: Self-pay

## 2019-07-04 ENCOUNTER — Other Ambulatory Visit (HOSPITAL_COMMUNITY): Payer: Self-pay | Admitting: Family Medicine

## 2019-07-04 DIAGNOSIS — M25562 Pain in left knee: Secondary | ICD-10-CM | POA: Diagnosis present

## 2019-07-04 DIAGNOSIS — M25552 Pain in left hip: Secondary | ICD-10-CM

## 2019-07-04 DIAGNOSIS — M25561 Pain in right knee: Secondary | ICD-10-CM

## 2019-07-04 DIAGNOSIS — M25551 Pain in right hip: Secondary | ICD-10-CM | POA: Diagnosis not present

## 2020-04-11 ENCOUNTER — Encounter: Payer: Self-pay | Admitting: Internal Medicine

## 2020-07-17 ENCOUNTER — Other Ambulatory Visit (HOSPITAL_COMMUNITY): Payer: Self-pay | Admitting: Family Medicine

## 2020-07-17 ENCOUNTER — Ambulatory Visit (HOSPITAL_COMMUNITY)
Admission: RE | Admit: 2020-07-17 | Discharge: 2020-07-17 | Disposition: A | Discharge status: Home / Self Care | Payer: Medicare HMO | Source: Ambulatory Visit | Attending: Family Medicine | Admitting: Family Medicine

## 2020-07-17 DIAGNOSIS — M79645 Pain in left finger(s): Secondary | ICD-10-CM | POA: Diagnosis present

## 2020-09-24 ENCOUNTER — Encounter: Payer: Self-pay | Admitting: Emergency Medicine

## 2020-09-24 ENCOUNTER — Ambulatory Visit
Admission: EM | Admit: 2020-09-24 | Discharge: 2020-09-24 | Disposition: A | Discharge status: Home / Self Care | Payer: Medicare HMO | Attending: Emergency Medicine | Admitting: Emergency Medicine

## 2020-09-24 ENCOUNTER — Other Ambulatory Visit: Payer: Self-pay

## 2020-09-24 DIAGNOSIS — M2550 Pain in unspecified joint: Secondary | ICD-10-CM

## 2020-09-24 MED ORDER — PREDNISONE 20 MG PO TABS
40.0000 mg | ORAL_TABLET | Freq: Every day | ORAL | 0 refills | Status: AC
Start: 2020-09-24 — End: 2020-09-29

## 2020-09-24 NOTE — ED Provider Notes (Signed)
HPI  SUBJECTIVE:  Phillip Pace is a 75 y.o. male who presents with 1 week of constant, sharp bilateral knee pain, worse on the right.  He states that it is also present in his ankles, elbows, MTPs bilaterally.  Reports hypersensitivity.  He reports increased temperature and swelling of the right knee, ankle and at the right MTP.  Symptoms started in his right MTP.  He states that overall he is getting better.  No fevers, joint erythema nausea, body aches, vomiting, change in his physical activity, rash.  No recent shellfish, red meat, red wine ingestion.  He tried ingesting baking soda solution and Voltaren cream with significant improvement in his symptoms.  Symptoms are worse with weightbearing, bending these joints, movement particularly at the right knee, ankle, MTP.  He states this is identical to previous episodes of gout.  He has had gout in all of these areas before.  He has coronary artery disease, is status post 13 stents, MI x5, hypertension, COPD, CHF.  No history of chronic kidney disease, diabetes, PVD/PAD, neuropathy, osteoarthritis.  PMD: He is establishing care with Dr. Wende Neighbors in 2 weeks    Past Medical History:  Diagnosis Date   Arthritis    CHF (congestive heart failure) ([place])    COPD (chronic obstructive pulmonary disease) ([place])    Coronary artery disease    Hypertension    MI (myocardial infarction) ([place])    x 5   Presence of stent in artery    13 stents    Past Surgical History:  Procedure Laterality Date   COLONOSCOPY N/A 05/25/2017   Procedure: COLONOSCOPY;  Surgeon: Daneil Dolin, MD;  [facility];  Service: Endoscopy;  Laterality: N/A;  12:00pm   ESOPHAGOGASTRODUODENOSCOPY N/A 05/25/2017   Procedure: ESOPHAGOGASTRODUODENOSCOPY (EGD);  Surgeon: Daneil Dolin, MD;  [facility];  Service: Endoscopy;  Laterality: N/A;   FRACTURE SURGERY     collar bone   HERNIA REPAIR     x 2   MALONEY DILATION N/A 05/25/2017   Procedure: MALONEY  DILATION;  Surgeon: Daneil Dolin, MD;  [facility];  Service: Endoscopy;  Laterality: N/A;   stents 27      Family History  Problem Relation Age of Onset   Cancer - Colon Mother    CAD Mother    Colon cancer Father    Gastric cancer Neg Hx    Esophageal cancer Neg Hx     Social History   Tobacco Use   Smoking status: Former    Types: Cigarettes    Quit date: 02/02/1993    Years since quitting: 27.6   Smokeless tobacco: Never  Vaping Use   Vaping Use: Never used  Substance Use Topics   Alcohol use: No    Alcohol/week: 0.0 standard drinks   Drug use: No    No current facility-administered medications for this encounter.  Current Outpatient Medications:    predniSONE (DELTASONE) 20 MG tablet, Take 2 tablets (40 mg total) by mouth daily with breakfast for 5 days., Disp: 10 tablet, Rfl: 0   aspirin 325 MG EC tablet, Take 325 mg by mouth daily. , Disp: , Rfl:    b complex vitamins capsule, Take 1 capsule by mouth daily., Disp: , Rfl:    Cholecalciferol (VITAMIN D3) 5000 units CAPS, Take 10,000 Units by mouth daily., Disp: , Rfl:    lisinopril (PRINIVIL,ZESTRIL) 5 MG tablet, Take 5 mg by mouth every evening., Disp: , Rfl:  magnesium oxide (MAG-OX) 400 MG tablet, Take 400 mg by mouth daily., Disp: , Rfl:    metoprolol succinate (TOPROL-XL) 25 MG 24 hr tablet, Take 1 tablet (25 mg total) by mouth daily., Disp: 30 tablet, Rfl: 6   niacin (SLO-NIACIN) 500 MG tablet, Take 500 mg by mouth daily. , Disp: , Rfl:    NITROSTAT 0.4 MG SL tablet, Take 0.4 mg by mouth as needed for chest pain. Repeat in 5 minutes if no relief for up to 3 total doses as needed for chest pain., Disp: , Rfl: 5   pantoprazole (PROTONIX) 40 MG tablet, TAKE 1 TABLET(40 MG) BY MOUTH TWICE DAILY BEFORE A MEAL, Disp: 60 tablet, Rfl: 11   simvastatin (ZOCOR) 40 MG tablet, Take 40 mg by mouth daily., Disp: , Rfl:    tamsulosin (FLOMAX) 0.4 MG CAPS capsule, Take 0.4 mg by mouth daily. , Disp: , Rfl:    Allergies  Allergen Reactions   Penicillins Shortness Of Breath, Itching, Swelling and Other (See Comments)    Has patient had a PCN reaction causing immediate rash, facial/tongue/throat swelling, SOB or lightheadedness with hypotension: Yes Has patient had a PCN reaction causing severe rash involving mucus membranes or skin necrosis: No Has patient had a PCN reaction that required hospitalization: No Has patient had a PCN reaction occurring within the last 10 years: No If all of the above answers are "NO", then may proceed with Cephalosporin use.    Shellfish Allergy Itching and Swelling     ROS  As noted in HPI.   Physical Exam  BP 113/71 (BP Location: Right Arm)   Pulse 86   Temp 98.2 F (36.8 C) (Oral)   Resp 18   SpO2 98%   Constitutional: Well developed, well nourished, no acute distress Eyes:  EOMI, conjunctiva normal bilaterally HENT: Normocephalic, atraumatic,mucus membranes moist Respiratory: Normal inspiratory effort Cardiovascular: Normal rate GI: nondistended skin: No rash, skin intact Musculoskeletal:  Wrist: Bilaterally, tenderness along the EPB.  Pain with forced ulnar deviation.  No erythema, swelling.  No pain with other wrist range of motion. Elbows: Bilaterally: No tenderness, erythema, swelling.  No pain with range of motion Knees: Right knee normal appearance.  Slightly increased temperature.  No erythema.  Positive posterior tenderness.  No posterior swelling.  No crepitus.  No appreciable effusion.  Joint stable with varus/valgus stress.  McMurray negative. Left knee normal appearance.  No tenderness.  No pain with range of motion. Right ankle: Normal appearance.  No bony tenderness.  No ligamentous tenderness.  Pain with flexion and extension, Inversion/eversion Right first MTP.  Mild swelling.  No erythema.  No increased temperature.  Diffuse tenderness.  Pain with toe flexion/extension.  Skin intact. Neurologic: Alert & oriented x 3, no focal  neuro deficits Psychiatric: Speech and behavior appropriate   ED Course   Medications - No data to display  No orders of the defined types were placed in this encounter.   No results found for this or any previous visit (from the past 24 hour(s)). No results found.  ED Clinical Impression  1. Polyarthralgia      ED Assessment/Plan  It would be unusual for him to have a gout attack of multiple joints like this, but it would also be unusual for have multiple joints with arthritis flare simultaneously.  Doubt infectious cause such as Lyme disease.  There is no evidence of septic joints.  States it started off primarily in the first MTP. Do not think that he needs  colchicine at this point in time as it has been over a week and he is getting better.   will send home with prednisone 40 mg for 5 days, and have him continue the topical Voltaren since it is working well for him.  Last creatinine was in 2016 at 1.50.  Calculated creatinine clearance 59 mL/min.  Patient states that his kidney function has been normal since.    Follow-up with PMD as needed.  To the ER for fevers or any sign of septic joint.  Discussed MDM, treatment plan, and plan for follow-up with patient. Discussed sn/sx that should prompt return to the ED. patient agrees with plan.   Meds ordered this encounter  Medications   predniSONE (DELTASONE) 20 MG tablet    Sig: Take 2 tablets (40 mg total) by mouth daily with breakfast for 5 days.    Dispense:  10 tablet    Refill:  0      *This clinic note was created using [employer]. Therefore, there may be occasional mistakes despite careful proofreading.  ?    Melynda Ripple, MD 09/25/20 [phone]

## 2020-09-24 NOTE — ED Triage Notes (Signed)
Knees , ankles, wrist and elbow pain x 1 week.  Pt thinks it is gout.  Denies any injury.

## 2020-09-24 NOTE — Discharge Instructions (Addendum)
Continue the Voltaren cream.  The prednisone will help whether this is arthritis or gout.

## 2020-10-03 DIAGNOSIS — I252 Old myocardial infarction: Secondary | ICD-10-CM | POA: Insufficient documentation

## 2020-10-03 DIAGNOSIS — M109 Gout, unspecified: Secondary | ICD-10-CM | POA: Insufficient documentation

## 2020-11-04 ENCOUNTER — Other Ambulatory Visit (HOSPITAL_COMMUNITY): Payer: Self-pay | Admitting: Family Medicine

## 2020-11-04 DIAGNOSIS — M25562 Pain in left knee: Secondary | ICD-10-CM

## 2020-11-05 ENCOUNTER — Ambulatory Visit (HOSPITAL_COMMUNITY)
Admission: RE | Admit: 2020-11-05 | Discharge: 2020-11-05 | Disposition: A | Discharge status: Home / Self Care | Payer: Medicare HMO | Source: Ambulatory Visit | Attending: Family Medicine | Admitting: Family Medicine

## 2020-11-05 ENCOUNTER — Other Ambulatory Visit: Payer: Self-pay

## 2020-11-05 ENCOUNTER — Other Ambulatory Visit (HOSPITAL_COMMUNITY): Payer: Self-pay | Admitting: Family Medicine

## 2020-11-05 DIAGNOSIS — M25562 Pain in left knee: Secondary | ICD-10-CM

## 2020-11-06 ENCOUNTER — Encounter: Payer: Self-pay | Admitting: Cardiology

## 2020-11-06 ENCOUNTER — Ambulatory Visit (INDEPENDENT_AMBULATORY_CARE_PROVIDER_SITE_OTHER): Payer: Medicare HMO | Admitting: Cardiology

## 2020-11-06 VITALS — BP 122/62 | HR 98 | Ht 75.0 in | Wt 198.0 lb

## 2020-11-06 DIAGNOSIS — I2583 Coronary atherosclerosis due to lipid rich plaque: Secondary | ICD-10-CM

## 2020-11-06 DIAGNOSIS — I10 Essential (primary) hypertension: Secondary | ICD-10-CM

## 2020-11-06 DIAGNOSIS — E785 Hyperlipidemia, unspecified: Secondary | ICD-10-CM | POA: Diagnosis not present

## 2020-11-06 DIAGNOSIS — I2510 Atherosclerotic heart disease of native coronary artery without angina pectoris: Secondary | ICD-10-CM | POA: Diagnosis not present

## 2020-11-06 MED ORDER — METOPROLOL SUCCINATE ER 25 MG PO TB24: 25.0000 mg | ORAL_TABLET | Freq: Every day | ORAL | 3 refills | Status: DC

## 2020-11-06 MED ORDER — ASPIRIN EC 81 MG PO TBEC
81.0000 mg | DELAYED_RELEASE_TABLET | Freq: Every day | ORAL | 3 refills | Status: DC
Start: 2020-11-06 — End: 2022-06-22

## 2020-11-06 MED ORDER — LISINOPRIL 5 MG PO TABS: 5.0000 mg | ORAL_TABLET | Freq: Every evening | ORAL | 3 refills | Status: DC

## 2020-11-06 MED ORDER — SIMVASTATIN 40 MG PO TABS: 40.0000 mg | ORAL_TABLET | Freq: Every day | ORAL | 3 refills | Status: DC

## 2020-11-06 MED ORDER — AMLODIPINE BESYLATE 5 MG PO TABS: 5.0000 mg | ORAL_TABLET | Freq: Every day | ORAL | 3 refills | Status: DC

## 2020-11-06 NOTE — Addendum Note (Signed)
Addended by: Fransico Him R on: 11/06/2020 03:00 PM   Modules accepted: Level of Service

## 2020-11-06 NOTE — Addendum Note (Signed)
Addended by: Antonieta Iba on: 11/06/2020 03:08 PM   Modules accepted: Orders

## 2020-11-06 NOTE — Progress Notes (Signed)
Cardiology CONSULT Note    Date:  11/06/2020   ID:  Phillip Pace, DOB 1945-05-29, MRN [id number]  PCP:  Celene Squibb, MD  Cardiologist:  Fransico Him, MD   Chief Complaint  Patient presents with   New Patient (Initial Visit)    CAD. HTN, HLD     History of Present Illness:  Phillip Pace is a 75 y.o. male who is being seen today for the evaluation of to establish cardiac care for his CAD at the request of Valentino Nose, FNP.  This is a 75yo male with a hx of ASCAD with multiple MIs in the past and 13 stents in [place] at [facility] (Dr. Towanda Malkin) and unknown arteries (no records available), HTN, COPD, CHF who is referred to establish cardiac care.  His last documented stress test was in 2016 which showed a small defect in the basal inferoseptal, basal inferior, mid inferoseptal and mid inferior location that was fixed and c/w prio MI with no ischemia and EF 50%.  2D echo 2016 with EF 40-45% with G1DD.    He is here today for followup and is doing well.  he denies any chest pain or pressure, SOB, DOE, PND, orthopnea, LE edema (except with gout), dizziness, palpitations or syncope. He is compliant with his meds and is tolerating meds with no SE.     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   Hypertension     Past Surgical History:  Procedure Laterality Date   COLONOSCOPY N/A 05/25/2017   Procedure: COLONOSCOPY;  Surgeon: Daneil Dolin, MD;  [facility];  Service: Endoscopy;  Laterality: N/A;  12:00pm   ESOPHAGOGASTRODUODENOSCOPY N/A 05/25/2017   Procedure: ESOPHAGOGASTRODUODENOSCOPY (EGD);  Surgeon: Daneil Dolin, MD;  [facility];  Service: Endoscopy;  Laterality: N/A;   FRACTURE SURGERY     collar bone   HERNIA REPAIR     x 2   MALONEY DILATION N/A 05/25/2017    Procedure: MALONEY DILATION;  Surgeon: Daneil Dolin, MD;  [facility];  Service: Endoscopy;  Laterality: N/A;   stents 13      Current Medications: Current Meds  Medication Sig   aspirin 325 MG EC tablet Take 325 mg by mouth daily.    b complex vitamins capsule Take 1 capsule by mouth daily.   Cholecalciferol (VITAMIN D3) 5000 units CAPS Take 10,000 Units by mouth daily.   diclofenac Sodium (VOLTAREN) 1 % GEL Voltaren Arthritis Pain 1 % topical gel  APPLY 2 GRAMS TO THE AFFECTED AREA(S) BY TOPICAL ROUTE 4 TIMES PER DAY   lisinopril (PRINIVIL,ZESTRIL) 5 MG tablet Take 5 mg by mouth every evening.   magnesium oxide (MAG-OX) 400 MG tablet Take 400 mg by mouth daily.   meloxicam (MOBIC) 15 MG tablet Take 15 mg by mouth daily.   metoprolol succinate (TOPROL-XL) 25 MG 24 hr tablet Take 1 tablet (25 mg total) by mouth daily.   niacin (SLO-NIACIN) 500 MG tablet Take 500 mg by mouth daily.    NITROSTAT 0.4 MG SL tablet Take 0.4 mg by mouth as needed for chest pain. Repeat in 5 minutes if no relief for up to 3 total doses as needed for chest pain.   pantoprazole (PROTONIX) 40 MG tablet TAKE  1 TABLET(40 MG) BY MOUTH TWICE DAILY BEFORE A MEAL   simvastatin (ZOCOR) 40 MG tablet Take 40 mg by mouth daily.   tamsulosin (FLOMAX) 0.4 MG CAPS capsule Take 0.4 mg by mouth daily.     Allergies:   Penicillins and Shellfish allergy   Social History   Socioeconomic History   Marital status: Single    Spouse name: Not on file   Number of children: Not on file   Years of education: Not on file   Highest education level: Not on file  Occupational History   Not on file  Tobacco Use   Smoking status: Former    Types: Cigarettes    Quit date: 02/02/1993    Years since quitting: 27.7   Smokeless tobacco: Never  Vaping Use   Vaping Use: Never used  Substance and Sexual Activity   Alcohol use: No    Alcohol/week: 0.0 standard drinks   Drug use: No   Sexual activity: Not on file  Other  Topics Concern   Not on file  Social History Narrative   Not on file   Social Determinants of Health   Financial Resource Strain: Not on file  Food Insecurity: Not on file  Transportation Needs: Not on file  Physical Activity: Not on file  Stress: Not on file  Social Connections: Not on file     Family History:  The patient's family history includes CAD in his mother; Cancer - Colon in his mother; Colon cancer in his father.   ROS:   Please see the history of present illness.    ROS All other systems reviewed and are negative.  No flowsheet data found.  PHYSICAL EXAM:   VS:  BP 122/62 (BP Location: Left Arm, Patient Position: Sitting, Cuff Size: Normal)   Pulse 98   Ht 6\' 3"  (1.905 m)   Wt 198 lb (89.8 kg)   SpO2 99%   BMI 24.75 kg/m    GEN: Well nourished, well developed, in no acute distress  HEENT: normal  Neck: no JVD, carotid bruits, or masses Cardiac: RRR; no murmurs, rubs, or gallops,no edema.  Intact distal pulses bilaterally.  Respiratory:  clear to auscultation bilaterally, normal work of breathing GI: soft, nontender, nondistended, + BS MS: no deformity or atrophy  Skin: warm and dry, no rash Neuro:  Alert and Oriented x 3, Strength and sensation are intact Psych: euthymic mood, full affect  Wt Readings from Last 3 Encounters:  11/06/20 198 lb (89.8 kg)  08/12/17 210 lb 3.2 oz (95.3 kg)  05/25/17 202 lb (91.6 kg)      Studies/Labs Reviewed:   EKG:  EKG is ordered today.  The ekg ordered today demonstrates NSR with no ST changes  Recent Labs: No results found for requested labs within last 8760 hours.   Lipid Panel    Component Value Date/Time   CHOL 178 07/21/2014 0618   TRIG 158 (H) 07/21/2014 0618   HDL 33 (L) 07/21/2014 0618   CHOLHDL 5.4 07/21/2014 0618   VLDL 32 07/21/2014 0618   LDLCALC 113 (H) 07/21/2014 0618     Additional studies/ records that were reviewed today include:  OV notes from PCP    ASSESSMENT:    1. Coronary  artery disease due to lipid rich plaque   2. Benign essential HTN   3. Hyperlipidemia LDL goal <70      PLAN:  In order of problems listed above:  ASCAD -he tells me he has had multiple MIs  and has had 13 stents - I do not have any records of this at this time -I will get a copy of his prior cardiac records from former Cardiologist in [place] -he has not had any anginal symptoms -continue BB and statin -continue ASA but decrease to 81mg  daily  2.  HLD -LDL goal < 70 -check FLP and ALT -continue Simvastatin 40mg  daily  3.  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 This time includes coordination of care and counseling regarding above mentioned problem list. Remainder of non-face-to-face time involved reviewing chart documents/testing relevant to the patient encounter and documentation in the medical record. I have independently reviewed documentation from referring provider  Medication Adjustments/Labs and Tests Ordered: Current medicines are reviewed at length with the patient today.  Concerns regarding medicines are outlined above.  Medication changes, Labs and Tests ordered today are listed in the Patient Instructions below.  There are no Patient Instructions on file for this visit.   Signed, Fransico Him, MD  11/06/2020 2:55 PM    [address], [place], [place]  [address] Phone: [phone]; Fax: [phone]

## 2020-11-06 NOTE — Patient Instructions (Signed)
Medication Instructions:  Your physician has recommended you make the following change in your medication: 1) DECREASE Asprin to 81 mg daily   *If you need a refill on your cardiac medications before your next appointment, please call your pharmacy*   Lab Work: Come back fasting for lab work - FLP and CMET   If you have labs (blood work) drawn today and your tests are completely normal, you will receive your results only by: White (if you have MyChart) OR A paper copy in the mail If you have any lab test that is abnormal or we need to change your treatment, we will call you to review the results.   Testing/Procedures: Your physician has requested that you have an echocardiogram. Echocardiography is a painless test that uses sound waves to create images of your heart. It provides your doctor with information about the size and shape of your heart and how well your heart's chambers and valves are working. This procedure takes approximately one hour. There are no restrictions for this procedure.   Follow-Up: At [facility], you and your health needs are our priority.  As part of our continuing mission to provide you with exceptional heart care, we have created designated Provider Care Teams.  These Care Teams include your primary Cardiologist (physician) and Advanced Practice Providers (APPs -  Physician Assistants and Nurse Practitioners) who all work together to provide you with the care you need, when you need it.    Your next appointment:   1 year(s)  The format for your next appointment:   In Person  Provider:   You may see Fransico Him, MD or one of the following Advanced Practice Providers on your designated Care Team:   [place], PA-C  Ermalinda Barrios, [place]

## 2020-11-12 ENCOUNTER — Other Ambulatory Visit (HOSPITAL_COMMUNITY)
Admission: RE | Admit: 2020-11-12 | Discharge: 2020-11-12 | Disposition: A | Discharge status: Home / Self Care | Payer: Medicare HMO | Source: Ambulatory Visit | Attending: Cardiology | Admitting: Cardiology

## 2020-11-12 ENCOUNTER — Other Ambulatory Visit: Payer: Self-pay

## 2020-11-12 DIAGNOSIS — I2510 Atherosclerotic heart disease of native coronary artery without angina pectoris: Secondary | ICD-10-CM | POA: Insufficient documentation

## 2020-11-12 DIAGNOSIS — I10 Essential (primary) hypertension: Secondary | ICD-10-CM | POA: Insufficient documentation

## 2020-11-12 DIAGNOSIS — E785 Hyperlipidemia, unspecified: Secondary | ICD-10-CM | POA: Diagnosis present

## 2020-11-12 DIAGNOSIS — I2583 Coronary atherosclerosis due to lipid rich plaque: Secondary | ICD-10-CM | POA: Insufficient documentation

## 2020-11-12 LAB — COMPREHENSIVE METABOLIC PANEL
ALT: 10 U/L (ref 0–44)
AST: 16 U/L (ref 15–41)
Albumin: 3.6 g/dL (ref 3.5–5.0)
Alkaline Phosphatase: 72 U/L (ref 38–126)
Anion gap: 9 (ref 5–15)
BUN: 39 mg/dL — ABNORMAL HIGH (ref 8–23)
CO2: 22 mmol/L (ref 22–32)
Calcium: 9.2 mg/dL (ref 8.9–10.3)
Chloride: 104 mmol/L (ref 98–111)
Creatinine, Ser: 1.53 mg/dL — ABNORMAL HIGH (ref 0.61–1.24)
GFR, Estimated: 47 mL/min — ABNORMAL LOW (ref >60)
Glucose, Bld: 86 mg/dL (ref 70–99)
Potassium: 4.4 mmol/L (ref 3.5–5.1)
Sodium: 135 mmol/L (ref 135–145)
Total Bilirubin: 0.7 mg/dL (ref 0.3–1.2)
Total Protein: 7.7 g/dL (ref 6.5–8.1)

## 2020-11-12 LAB — LIPID PANEL
Cholesterol: 161 mg/dL (ref 0–200)
HDL: 41 mg/dL (ref >40)
LDL Cholesterol: 51 mg/dL (ref 0–99)
Total CHOL/HDL Ratio: 3.9 RATIO
Triglycerides: 347 mg/dL — ABNORMAL HIGH (ref <150)
VLDL: 69 mg/dL — ABNORMAL HIGH (ref 0–40)

## 2020-11-13 ENCOUNTER — Telehealth: Payer: Self-pay | Admitting: *Deleted

## 2020-11-13 DIAGNOSIS — E785 Hyperlipidemia, unspecified: Secondary | ICD-10-CM

## 2020-11-13 MED ORDER — ROSUVASTATIN CALCIUM 40 MG PO TABS
40.0000 mg | ORAL_TABLET | Freq: Every day | ORAL | 3 refills | Status: DC
Start: 2020-11-13 — End: 2023-03-29

## 2020-11-13 MED ORDER — ICOSAPENT ETHYL 1 G PO CAPS: 2.0000 g | ORAL_CAPSULE | Freq: Two times a day (BID) | ORAL | 11 refills | Status: DC

## 2020-11-13 NOTE — Telephone Encounter (Signed)
Sueanne Margarita, MD  11/12/2020 11:33 AM EDT Back to Top    TAGS too high - please find out if he was fasting for this - if yes then stop simvastatin and change to Crestor 40mg  daily and add Vascepa 2gm BID and repeat FLp and ALT in 8 weeks  Pt notified and agrees with plan of care.

## 2020-12-06 ENCOUNTER — Other Ambulatory Visit: Payer: Self-pay

## 2020-12-06 ENCOUNTER — Ambulatory Visit (HOSPITAL_COMMUNITY)
Admission: RE | Admit: 2020-12-06 | Discharge: 2020-12-06 | Disposition: A | Discharge status: Home / Self Care | Payer: Medicare HMO | Source: Ambulatory Visit | Attending: Cardiology | Admitting: Cardiology

## 2020-12-06 DIAGNOSIS — I2583 Coronary atherosclerosis due to lipid rich plaque: Secondary | ICD-10-CM

## 2020-12-06 DIAGNOSIS — I2510 Atherosclerotic heart disease of native coronary artery without angina pectoris: Secondary | ICD-10-CM | POA: Diagnosis not present

## 2020-12-06 DIAGNOSIS — I10 Essential (primary) hypertension: Secondary | ICD-10-CM | POA: Diagnosis not present

## 2020-12-06 DIAGNOSIS — E785 Hyperlipidemia, unspecified: Secondary | ICD-10-CM | POA: Insufficient documentation

## 2020-12-06 LAB — ECHOCARDIOGRAM COMPLETE
Area-P 1/2: 4.89 cm2
S' Lateral: 3.9 cm

## 2020-12-06 NOTE — Progress Notes (Signed)
*  PRELIMINARY RESULTS* Echocardiogram 2D Echocardiogram has been performed.  Phillip Pace 12/06/2020, 12:32 PM

## 2020-12-09 ENCOUNTER — Other Ambulatory Visit: Payer: Self-pay

## 2020-12-09 ENCOUNTER — Emergency Department (HOSPITAL_COMMUNITY)
Admission: EM | Admit: 2020-12-09 | Discharge: 2020-12-09 | Disposition: A | Discharge status: Home / Self Care | Payer: Medicare HMO | Attending: Emergency Medicine | Admitting: Emergency Medicine

## 2020-12-09 ENCOUNTER — Encounter (HOSPITAL_COMMUNITY): Payer: Self-pay

## 2020-12-09 ENCOUNTER — Emergency Department (HOSPITAL_COMMUNITY): Payer: Medicare HMO

## 2020-12-09 DIAGNOSIS — M25562 Pain in left knee: Secondary | ICD-10-CM | POA: Insufficient documentation

## 2020-12-09 DIAGNOSIS — M25561 Pain in right knee: Secondary | ICD-10-CM | POA: Diagnosis not present

## 2020-12-09 DIAGNOSIS — Z20822 Contact with and (suspected) exposure to covid-19: Secondary | ICD-10-CM | POA: Insufficient documentation

## 2020-12-09 DIAGNOSIS — M2550 Pain in unspecified joint: Secondary | ICD-10-CM | POA: Diagnosis not present

## 2020-12-09 DIAGNOSIS — I110 Hypertensive heart disease with heart failure: Secondary | ICD-10-CM | POA: Diagnosis not present

## 2020-12-09 DIAGNOSIS — I509 Heart failure, unspecified: Secondary | ICD-10-CM | POA: Diagnosis not present

## 2020-12-09 DIAGNOSIS — Z79899 Other long term (current) drug therapy: Secondary | ICD-10-CM | POA: Diagnosis not present

## 2020-12-09 DIAGNOSIS — I2510 Atherosclerotic heart disease of native coronary artery without angina pectoris: Secondary | ICD-10-CM | POA: Insufficient documentation

## 2020-12-09 DIAGNOSIS — R531 Weakness: Secondary | ICD-10-CM | POA: Diagnosis not present

## 2020-12-09 DIAGNOSIS — R0789 Other chest pain: Secondary | ICD-10-CM | POA: Diagnosis not present

## 2020-12-09 DIAGNOSIS — M25511 Pain in right shoulder: Secondary | ICD-10-CM | POA: Insufficient documentation

## 2020-12-09 DIAGNOSIS — Z7982 Long term (current) use of aspirin: Secondary | ICD-10-CM | POA: Insufficient documentation

## 2020-12-09 DIAGNOSIS — Z87891 Personal history of nicotine dependence: Secondary | ICD-10-CM | POA: Insufficient documentation

## 2020-12-09 DIAGNOSIS — R209 Unspecified disturbances of skin sensation: Secondary | ICD-10-CM | POA: Insufficient documentation

## 2020-12-09 DIAGNOSIS — J449 Chronic obstructive pulmonary disease, unspecified: Secondary | ICD-10-CM | POA: Diagnosis not present

## 2020-12-09 LAB — COMPREHENSIVE METABOLIC PANEL
ALT: 13 U/L (ref 0–44)
AST: 20 U/L (ref 15–41)
Albumin: 3.4 g/dL — ABNORMAL LOW (ref 3.5–5.0)
Alkaline Phosphatase: 97 U/L (ref 38–126)
Anion gap: 11 (ref 5–15)
BUN: 24 mg/dL — ABNORMAL HIGH (ref 8–23)
CO2: 19 mmol/L — ABNORMAL LOW (ref 22–32)
Calcium: 9.7 mg/dL (ref 8.9–10.3)
Chloride: 105 mmol/L (ref 98–111)
Creatinine, Ser: 1.85 mg/dL — ABNORMAL HIGH (ref 0.61–1.24)
GFR, Estimated: 38 mL/min — ABNORMAL LOW (ref >60)
Glucose, Bld: 155 mg/dL — ABNORMAL HIGH (ref 70–99)
Potassium: 3.7 mmol/L (ref 3.5–5.1)
Sodium: 135 mmol/L (ref 135–145)
Total Bilirubin: 0.8 mg/dL (ref 0.3–1.2)
Total Protein: 8.1 g/dL (ref 6.5–8.1)

## 2020-12-09 LAB — CBC WITH DIFFERENTIAL/PLATELET
Abs Immature Granulocytes: 0.03 10*3/uL (ref 0.00–0.07)
Basophils Absolute: 0 10*3/uL (ref 0.0–0.1)
Basophils Relative: 0 %
Eosinophils Absolute: 0.1 10*3/uL (ref 0.0–0.5)
Eosinophils Relative: 2 %
HCT: 30.2 % — ABNORMAL LOW (ref 39.0–52.0)
Hemoglobin: 10.1 g/dL — ABNORMAL LOW (ref 13.0–17.0)
Immature Granulocytes: 0 %
Lymphocytes Relative: 17 %
Lymphs Abs: 1.2 10*3/uL (ref 0.7–4.0)
MCH: 28.2 pg (ref 26.0–34.0)
MCHC: 33.4 g/dL (ref 30.0–36.0)
MCV: 84.4 fL (ref 80.0–100.0)
Monocytes Absolute: 0.6 10*3/uL (ref 0.1–1.0)
Monocytes Relative: 8 %
Neutro Abs: 5 10*3/uL (ref 1.7–7.7)
Neutrophils Relative %: 73 %
Platelets: 222 10*3/uL (ref 150–400)
RBC: 3.58 MIL/uL — ABNORMAL LOW (ref 4.22–5.81)
RDW: 15.1 % (ref 11.5–15.5)
WBC: 6.9 10*3/uL (ref 4.0–10.5)
nRBC: 0 % (ref 0.0–0.2)

## 2020-12-09 LAB — TROPONIN I (HIGH SENSITIVITY)
Troponin I (High Sensitivity): 50 ng/L — ABNORMAL HIGH (ref <18)
Troponin I (High Sensitivity): 50 ng/L — ABNORMAL HIGH (ref <18)

## 2020-12-09 LAB — C-REACTIVE PROTEIN: CRP: 7.1 mg/dL — ABNORMAL HIGH (ref <1.0)

## 2020-12-09 LAB — SEDIMENTATION RATE: Sed Rate: 100 mm/hr — ABNORMAL HIGH (ref 0–16)

## 2020-12-09 LAB — RESP PANEL BY RT-PCR (FLU A&B, COVID) ARPGX2
Influenza A by PCR: NEGATIVE
Influenza B by PCR: NEGATIVE
SARS Coronavirus 2 by RT PCR: NEGATIVE

## 2020-12-09 MED ORDER — OXYCODONE HCL 5 MG PO TABS: 5.0000 mg | ORAL_TABLET | ORAL | 0 refills | Status: DC | PRN

## 2020-12-09 MED ORDER — PREDNISONE 10 MG (21) PO TBPK: ORAL_TABLET | Freq: Every day | ORAL | 0 refills | Status: DC

## 2020-12-09 MED ORDER — OXYCODONE-ACETAMINOPHEN 5-325 MG PO TABS
1.0000 | ORAL_TABLET | Freq: Once | ORAL | Status: AC
  Administered 2020-12-09: 1 via ORAL
  Filled 2020-12-09: qty 1

## 2020-12-09 MED ORDER — DEXAMETHASONE SODIUM PHOSPHATE 10 MG/ML IJ SOLN: 10.0000 mg | Freq: Once | INTRAMUSCULAR | Status: DC

## 2020-12-09 NOTE — Discharge Instructions (Signed)
Follow up with your PCP, call tomorrow to schedule an appointment. Take oxycodone for pain not controlled with Tylenol at home. Take Prednisone starting TOMORROW as prescribed and complete the full course.

## 2020-12-09 NOTE — ED Provider Notes (Signed)
[facility] EMERGENCY DEPARTMENT Provider Note   CSN: [id number] Arrival date & time: 12/09/20  1211     History Chief Complaint  Patient presents with   Shoulder Pain    Phillip Pace is a 75 y.o. male.  75 year old male with a PMH of previous MI's, COPD, CHF, HTN, former smoker (quit in 1995), presents to the emergency department with a chief complaint of chest and shoulder pain. Reports he first noticed pain in both of his knees, left worse than right, onset Friday (3 days ago), without fall or injury. Patient states the pain started 2 days (Saturday) ago. He states he has been having left sided chest pain as well as right sided shoulder pain. He says the pain in his right shoulder is located in his axilla and causes some numbness and weakness in the right arm. He has not taken anything for the pain. Pain is intermittent, worse if he sits upright in bed, worse with palpation of right axilla, no skin changes. He denies any alleviating factors. Patient denies any injury to shoulder. Patient denies any palpitations, or dyspnea. During evaluation, experienced episode of pain in the right shoulder. Also with frequent chills which he attributes to the cold ER room.       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   Hypertension     Patient Active Problem List   Diagnosis Date Noted   Barrett's esophagus 08/12/2017   History of adenomatous polyp of colon 04/07/2017   Family history of colon cancer 04/07/2017   Dysphagia 04/07/2017   Chest pain 07/20/2014   ARF (acute renal failure) ([place]) 07/20/2014   Hyperglycemia 07/20/2014   Fever 07/20/2014    Past Surgical History:  Procedure Laterality Date   COLONOSCOPY N/A 05/25/2017   Procedure: COLONOSCOPY;  Surgeon: Daneil Dolin, MD;  [facility];   Service: Endoscopy;  Laterality: N/A;  12:00pm   ESOPHAGOGASTRODUODENOSCOPY N/A 05/25/2017   Procedure: ESOPHAGOGASTRODUODENOSCOPY (EGD);  Surgeon: Daneil Dolin, MD;  [facility];  Service: Endoscopy;  Laterality: N/A;   FRACTURE SURGERY     collar bone   HERNIA REPAIR     x 2   MALONEY DILATION N/A 05/25/2017   Procedure: MALONEY DILATION;  Surgeon: Daneil Dolin, MD;  [facility];  Service: Endoscopy;  Laterality: N/A;   stents 95         Family History  Problem Relation Age of Onset   Cancer - Colon Mother    CAD Mother    Colon cancer Father    Gastric cancer Neg Hx    Esophageal cancer Neg Hx     Social History   Tobacco Use   Smoking status: Former    Types: Cigarettes    Quit date: 02/02/1993    Years since quitting: 27.8   Smokeless tobacco: Never  Vaping Use   Vaping Use: Never used  Substance Use Topics   Alcohol use: No    Alcohol/week: 0.0 standard drinks   Drug use: No    Home Medications Prior to Admission medications   Medication Sig Start Date End Date Taking? Authorizing Provider  amLODipine (NORVASC) 5 MG tablet Take 1 tablet (5 mg total) by mouth daily. 11/06/20  Yes Sueanne Margarita, MD  aspirin EC 81 MG tablet Take 1 tablet (81 mg total) by mouth daily. Swallow whole. 11/06/20  Yes Turner, Eber Hong, MD  b complex vitamins capsule Take 1 capsule by mouth daily.   Yes [provider]  Cholecalciferol (VITAMIN D3) 5000 units CAPS Take 10,000 Units by mouth daily.   Yes [provider]  diclofenac Sodium (VOLTAREN) 1 % GEL Apply 2 g topically 4 (four) times daily.   Yes [provider]  icosapent Ethyl (VASCEPA) 1 g capsule Take 2 capsules (2 g total) by mouth 2 (two) times daily. 11/13/20  Yes Turner, Eber Hong, MD  lisinopril (ZESTRIL) 5 MG tablet Take 1 tablet (5 mg total) by mouth every evening. 11/06/20  Yes Turner, Eber Hong, MD  magnesium oxide (MAG-OX) 400 MG tablet Take 400 mg by mouth daily.   Yes  [provider]  meloxicam (MOBIC) 15 MG tablet Take 15 mg by mouth daily. 08/14/20  Yes [provider]  metoprolol succinate (TOPROL-XL) 25 MG 24 hr tablet Take 1 tablet (25 mg total) by mouth daily. 11/06/20  Yes Turner, Eber Hong, MD  niacin (SLO-NIACIN) 500 MG tablet Take 500 mg by mouth daily.    Yes [provider]  oxyCODONE (ROXICODONE) 5 MG immediate release tablet Take 1 tablet (5 mg total) by mouth every 4 (four) hours as needed for severe pain. 12/09/20  Yes Tacy Learn, PA-C  pantoprazole (PROTONIX) 40 MG tablet TAKE 1 TABLET(40 MG) BY MOUTH TWICE DAILY BEFORE A MEAL Patient taking differently: Take 40 mg by mouth 2 (two) times daily before a meal. 09/19/18  Yes Mahala Menghini, PA-C  predniSONE (STERAPRED UNI-PAK 21 TAB) 10 MG (21) TBPK tablet Take by mouth daily. Take 6 tabs by mouth daily  for 2 days, then 5 tabs for 2 days, then 4 tabs for 2 days, then 3 tabs for 2 days, 2 tabs for 2 days, then 1 tab by mouth daily for 2 days 12/09/20  Yes Tacy Learn, PA-C  rosuvastatin (CRESTOR) 40 MG tablet Take 1 tablet (40 mg total) by mouth daily. 11/13/20 02/11/21 Yes Turner, Eber Hong, MD  tamsulosin (FLOMAX) 0.4 MG CAPS capsule Take 0.4 mg by mouth daily.  11/25/15  Yes [provider]  NITROSTAT 0.4 MG SL tablet Take 0.4 mg by mouth as needed for chest pain. Repeat in 5 minutes if no relief for up to 3 total doses as needed for chest pain. 07/06/14   [provider]    Allergies    Penicillins and Shellfish allergy  Review of Systems   Review of Systems  Constitutional:  Positive for chills. Negative for diaphoresis and fever.  HENT:  Positive for congestion.   Respiratory:  Positive for cough. Negative for shortness of breath.   Cardiovascular:  Positive for chest pain.  Gastrointestinal:  Negative for abdominal pain, nausea and vomiting.  Musculoskeletal:  Positive for arthralgias. Negative for back pain and neck pain.  Skin:  Negative for  rash and wound.  Allergic/Immunologic: Negative for immunocompromised state.  Neurological:  Positive for weakness and numbness.  Hematological:  Negative for adenopathy. Does not bruise/bleed easily.  Psychiatric/Behavioral:  Negative for confusion.   All other systems reviewed and are negative.  Physical Exam Updated Vital Signs BP 126/68   Pulse 64   Temp 97.8 F (36.6 C) (Oral)   Resp 10   Ht 6\' 3"  (1.905 m)   Wt 89.8 kg   SpO2 95%   BMI 24.75  kg/m   Physical Exam Vitals and nursing note reviewed.  Constitutional:      General: He is not in acute distress.    Appearance: He is well-developed. He is not diaphoretic.     Comments: Frequent chills  HENT:     Head: Normocephalic and atraumatic.  Cardiovascular:     Rate and Rhythm: Normal rate and regular rhythm.     Pulses: Normal pulses.     Heart sounds: Normal heart sounds.  Pulmonary:     Effort: Pulmonary effort is normal.     Breath sounds: Normal breath sounds.  Chest:     Chest wall: Tenderness present.  Breasts:    Right: No skin change.    Abdominal:     Palpations: Abdomen is soft.     Tenderness: There is no abdominal tenderness.  Musculoskeletal:        General: Tenderness present. No swelling.     Cervical back: Neck supple.     Right lower leg: No edema.     Left lower leg: No edema.     Comments: Arthritic changes to knees bilaterally, no erythema. Mild TTP left posterior knee. No calf swelling, no lower extremity edema.  Skin:    General: Skin is warm and dry.     Findings: No erythema or rash.  Neurological:     Mental Status: He is alert and oriented to person, place, and time.     Sensory: No sensory deficit.     Motor: No weakness.  Psychiatric:        Behavior: Behavior normal.    ED Results / Procedures / Treatments   Labs (all labs ordered are listed, but only abnormal results are displayed) Labs Reviewed  CBC WITH DIFFERENTIAL/PLATELET - Abnormal; Notable for the following  components:      Result Value   RBC 3.58 (*)    Hemoglobin 10.1 (*)    HCT 30.2 (*)    All other components within normal limits  COMPREHENSIVE METABOLIC PANEL - Abnormal; Notable for the following components:   CO2 19 (*)    Glucose, Bld 155 (*)    BUN 24 (*)    Creatinine, Ser 1.85 (*)    Albumin 3.4 (*)    GFR, Estimated 38 (*)    All other components within normal limits  SEDIMENTATION RATE - Abnormal; Notable for the following components:   Sed Rate 100 (*)    All other components within normal limits  C-REACTIVE PROTEIN - Abnormal; Notable for the following components:   CRP 7.1 (*)    All other components within normal limits  TROPONIN I (HIGH SENSITIVITY) - Abnormal; Notable for the following components:   Troponin I (High Sensitivity) 50 (*)    All other components within normal limits  TROPONIN I (HIGH SENSITIVITY) - Abnormal; Notable for the following components:   Troponin I (High Sensitivity) 50 (*)    All other components within normal limits  RESP PANEL BY RT-PCR (FLU A&B, COVID) [id number]    EKG EKG Interpretation  Date/Time:  Monday December 09 2020 14:46:14 EST Ventricular Rate:  69 PR Interval:  169 QRS Duration: 105 QT Interval:  451 QTC Calculation: 484 R Axis:   6 Text Interpretation: Sinus rhythm Low voltage, precordial leads Abnormal R-wave progression, early transition Borderline prolonged QT interval since last tracing no significant change Confirmed by Noemi Chapel [phone]) on 12/09/2020 5:57:52 PM  Radiology DG Chest Port 1 View  Result Date: 12/09/2020 CLINICAL DATA:  Shortness of breath. EXAM: PORTABLE CHEST 1 VIEW COMPARISON:  07/20/2014 FINDINGS: Heart size and mediastinal contours are unremarkable. No pleural effusion or edema identified. No airspace consolidation. Visualized osseous structures appear intact. IMPRESSION: No acute cardiopulmonary abnormalities. Electronically Signed   By: Kerby Moors M.D.   On: 12/09/2020 14:04     Procedures Procedures   Medications Ordered in ED Medications  dexamethasone (DECADRON) injection 10 mg (has no administration in time range)  oxyCODONE-acetaminophen (PERCOCET/ROXICET) 5-325 MG per tablet 1 tablet (1 tablet Oral Given 12/09/20 1455)    ED Course  I have reviewed the triage vital signs and the nursing notes.  Pertinent labs & imaging results that were available during my care of the patient were reviewed by me and considered in my medical decision making (see chart for details).  Clinical Course as of 12/09/20 Stefanie Libel Dec 10, 1270  4064 75 year old male with complaint of bilateral knee pain initially, now with pain in right shoulder/axillary area that radiates across his chest, is reproduced with palpation and movement. Cardiac work up initiated on triage complaints, history not consistent with cardiac source. Troponins are unchanged at 50 and 50, EKG reviewed with Dr. Sabra Heck, unchanged from prior, no acute ischemic changes. CBC with slight anemia at 10.1. CMP with slight increase in Cr to 1.85 today. Sed rate and CRP elevated at 100 and 7.1. Flu and COVID negative. CXR unremarkable. Plan is to treat polyarthralgia with dose of Decadron in the ER tonight, will discharge with prednisone taper and oxycodone for pain.  Recommend patient follow-up with his primary care provider for further evaluation of his elevated inflammatory markers tonight. [LM]    Clinical Course User Index [LM] Roque Lias   MDM Rules/Calculators/A&P                           Final Clinical Impression(s) / ED Diagnoses Final diagnoses:  Polyarthralgia    Rx / DC Orders ED Discharge Orders          Ordered    predniSONE (STERAPRED UNI-PAK 21 TAB) 10 MG (21) TBPK tablet  Daily        12/09/20 1821    oxyCODONE (ROXICODONE) 5 MG immediate release tablet  Every 4 hours PRN        12/09/20 1821             Tacy Learn, PA-C 12/09/20 Loman Chroman,  MD 12/12/20 1250

## 2020-12-09 NOTE — ED Triage Notes (Signed)
Pt c/o right shoulder pain down right arm since Saturday. Pt found by off duty officer in parking lot slumped over with difficulty breathing. Pt brought to RM 12. Pt states he has had SOB, pain radiates to jaw causes numbness. Pt states sometimes he gets the pain in the left side of chest. Pt states he hasn't had an appetite for 3-4 days.

## 2020-12-11 ENCOUNTER — Encounter: Payer: Self-pay | Admitting: Cardiology

## 2020-12-16 ENCOUNTER — Telehealth: Payer: Self-pay

## 2020-12-16 DIAGNOSIS — I77810 Thoracic aortic ectasia: Secondary | ICD-10-CM

## 2020-12-16 NOTE — Telephone Encounter (Signed)
Patient notified and verbalized understanding.. Pt agreeable to have noncontrasted chest CT. Pt had no questions or concerns at this time.

## 2020-12-24 ENCOUNTER — Telehealth: Payer: Self-pay | Admitting: Cardiology

## 2020-12-24 NOTE — Telephone Encounter (Signed)
Checking percert on the following patient for testing scheduled at [facility].     CT ANGIO CHEST  01/28/2021

## 2021-01-04 ENCOUNTER — Emergency Department (HOSPITAL_COMMUNITY)
Admission: EM | Admit: 2021-01-04 | Discharge: 2021-01-04 | Disposition: A | Discharge status: Home / Self Care | Payer: Medicare HMO | Attending: Emergency Medicine | Admitting: Emergency Medicine

## 2021-01-04 ENCOUNTER — Encounter (HOSPITAL_COMMUNITY): Payer: Self-pay | Admitting: *Deleted

## 2021-01-04 ENCOUNTER — Other Ambulatory Visit: Payer: Self-pay

## 2021-01-04 ENCOUNTER — Emergency Department (HOSPITAL_COMMUNITY): Payer: Medicare HMO

## 2021-01-04 DIAGNOSIS — I509 Heart failure, unspecified: Secondary | ICD-10-CM | POA: Diagnosis not present

## 2021-01-04 DIAGNOSIS — I110 Hypertensive heart disease with heart failure: Secondary | ICD-10-CM | POA: Insufficient documentation

## 2021-01-04 DIAGNOSIS — Z79899 Other long term (current) drug therapy: Secondary | ICD-10-CM | POA: Diagnosis not present

## 2021-01-04 DIAGNOSIS — I2510 Atherosclerotic heart disease of native coronary artery without angina pectoris: Secondary | ICD-10-CM | POA: Insufficient documentation

## 2021-01-04 DIAGNOSIS — Z20822 Contact with and (suspected) exposure to covid-19: Secondary | ICD-10-CM | POA: Insufficient documentation

## 2021-01-04 DIAGNOSIS — J449 Chronic obstructive pulmonary disease, unspecified: Secondary | ICD-10-CM | POA: Diagnosis not present

## 2021-01-04 DIAGNOSIS — Z7982 Long term (current) use of aspirin: Secondary | ICD-10-CM | POA: Insufficient documentation

## 2021-01-04 DIAGNOSIS — Z87891 Personal history of nicotine dependence: Secondary | ICD-10-CM | POA: Insufficient documentation

## 2021-01-04 DIAGNOSIS — M791 Myalgia, unspecified site: Secondary | ICD-10-CM | POA: Diagnosis not present

## 2021-01-04 DIAGNOSIS — M2550 Pain in unspecified joint: Secondary | ICD-10-CM

## 2021-01-04 DIAGNOSIS — R778 Other specified abnormalities of plasma proteins: Secondary | ICD-10-CM | POA: Diagnosis not present

## 2021-01-04 LAB — COMPREHENSIVE METABOLIC PANEL
ALT: 14 U/L (ref 0–44)
AST: 16 U/L (ref 15–41)
Albumin: 3.2 g/dL — ABNORMAL LOW (ref 3.5–5.0)
Alkaline Phosphatase: 78 U/L (ref 38–126)
Anion gap: 12 (ref 5–15)
BUN: 20 mg/dL (ref 8–23)
CO2: 19 mmol/L — ABNORMAL LOW (ref 22–32)
Calcium: 9 mg/dL (ref 8.9–10.3)
Chloride: 99 mmol/L (ref 98–111)
Creatinine, Ser: 1.46 mg/dL — ABNORMAL HIGH (ref 0.61–1.24)
GFR, Estimated: 50 mL/min — ABNORMAL LOW (ref >60)
Glucose, Bld: 111 mg/dL — ABNORMAL HIGH (ref 70–99)
Potassium: 3.8 mmol/L (ref 3.5–5.1)
Sodium: 130 mmol/L — ABNORMAL LOW (ref 135–145)
Total Bilirubin: 0.8 mg/dL (ref 0.3–1.2)
Total Protein: 7.1 g/dL (ref 6.5–8.1)

## 2021-01-04 LAB — CBC WITH DIFFERENTIAL/PLATELET
Abs Immature Granulocytes: 0.04 10*3/uL (ref 0.00–0.07)
Basophils Absolute: 0 10*3/uL (ref 0.0–0.1)
Basophils Relative: 0 %
Eosinophils Absolute: 0.1 10*3/uL (ref 0.0–0.5)
Eosinophils Relative: 2 %
HCT: 28.7 % — ABNORMAL LOW (ref 39.0–52.0)
Hemoglobin: 9.5 g/dL — ABNORMAL LOW (ref 13.0–17.0)
Immature Granulocytes: 1 %
Lymphocytes Relative: 21 %
Lymphs Abs: 1.2 10*3/uL (ref 0.7–4.0)
MCH: 27.5 pg (ref 26.0–34.0)
MCHC: 33.1 g/dL (ref 30.0–36.0)
MCV: 82.9 fL (ref 80.0–100.0)
Monocytes Absolute: 0.5 10*3/uL (ref 0.1–1.0)
Monocytes Relative: 9 %
Neutro Abs: 3.9 10*3/uL (ref 1.7–7.7)
Neutrophils Relative %: 67 %
Platelets: 245 10*3/uL (ref 150–400)
RBC: 3.46 MIL/uL — ABNORMAL LOW (ref 4.22–5.81)
RDW: 15.7 % — ABNORMAL HIGH (ref 11.5–15.5)
WBC: 5.8 10*3/uL (ref 4.0–10.5)
nRBC: 0 % (ref 0.0–0.2)

## 2021-01-04 LAB — RESP PANEL BY RT-PCR (FLU A&B, COVID) ARPGX2
Influenza A by PCR: NEGATIVE
Influenza B by PCR: NEGATIVE
SARS Coronavirus 2 by RT PCR: NEGATIVE

## 2021-01-04 LAB — TROPONIN I (HIGH SENSITIVITY)
Troponin I (High Sensitivity): 47 ng/L — ABNORMAL HIGH (ref <18)
Troponin I (High Sensitivity): 49 ng/L — ABNORMAL HIGH (ref <18)

## 2021-01-04 MED ORDER — ACETAMINOPHEN 325 MG PO TABS
650.0000 mg | ORAL_TABLET | Freq: Once | ORAL | Status: AC
  Administered 2021-01-04: 650 mg via ORAL
  Filled 2021-01-04: qty 2

## 2021-01-04 NOTE — ED Notes (Signed)
Patient transported to X-ray 

## 2021-01-04 NOTE — ED Triage Notes (Signed)
Vomiting in triage 

## 2021-01-04 NOTE — ED Triage Notes (Signed)
Aching all over in his joints

## 2021-01-04 NOTE — Discharge Instructions (Signed)
Please follow-up with your family doctor on your regular schedule appointment.  Please return to the emergency department if you experience worsening joint pain, fevers, or any other concerns you may have.

## 2021-01-04 NOTE — ED Provider Notes (Signed)
Phillip Pace [facility] EMERGENCY DEPARTMENT Provider Note   CSN: [id number] Arrival date & time: 01/04/21  1357     History Chief Complaint  Patient presents with   Joint Pain    Phillip Pace is a 75 y.o. male with history of coronary artery disease, multiple MIs, COPD, and CHF who presents to the emergency department with diffuse arthralgias that began last night.  Denies any sick contacts at home.  Patient also endorses some substernal chest pain with radiation to the left arm.  Has had 2 episodes since last night.  Did not take any nitroglycerin.  Currently does not have any chest pain.  He reports associated chills and 1 episode of vomiting but denies any fever, diarrhea, abdominal pain, urinary complaints, cough, and congestion.  Patient took Aleve prior to arrival which did not help his generalized arthralgias.  Currently rates his arthralgias moderate in severity.  HPI     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   Dilated aortic root (HCC)    42 mm by 2D echo 12/2020   Hypertension     Patient Active Problem List   Diagnosis Date Noted   Barrett's esophagus 08/12/2017   History of adenomatous polyp of colon 04/07/2017   Family history of colon cancer 04/07/2017   Dysphagia 04/07/2017   Chest pain 07/20/2014   ARF (acute renal failure) ([place]) 07/20/2014   Hyperglycemia 07/20/2014   Fever 07/20/2014    Past Surgical History:  Procedure Laterality Date   COLONOSCOPY N/A 05/25/2017   Procedure: COLONOSCOPY;  Surgeon: Daneil Dolin, MD;  Location: [facility] ENDO [facility];  Service: Endoscopy;  Laterality: N/A;  12:00pm   ESOPHAGOGASTRODUODENOSCOPY N/A 05/25/2017   Procedure: ESOPHAGOGASTRODUODENOSCOPY (EGD);  Surgeon: Daneil Dolin, MD;  Location: [facility] ENDO [facility];  Service: Endoscopy;  Laterality: N/A;   FRACTURE  SURGERY     collar bone   HERNIA REPAIR     x 2   MALONEY DILATION N/A 05/25/2017   Procedure: MALONEY DILATION;  Surgeon: Daneil Dolin, MD;  Location: [facility];  Service: Endoscopy;  Laterality: N/A;   stents 51         Family History  Problem Relation Age of Onset   Cancer - Colon Mother    CAD Mother    Colon cancer Father    Gastric cancer Neg Hx    Esophageal cancer Neg Hx     Social History   Tobacco Use   Smoking status: Former    Types: Cigarettes    Quit date: 02/02/1993    Years since quitting: 27.9   Smokeless tobacco: Never  Vaping Use   Vaping Use: Never used  Substance Use Topics   Alcohol use: No    Alcohol/week: 0.0 standard drinks   Drug use: No    Home Medications Prior to Admission medications   Medication Sig Start Date End Date Taking? Authorizing Provider  allopurinol (ZYLOPRIM) 100 MG tablet Take 100 mg by mouth daily.   Yes [provider]  amLODipine (NORVASC) 5 MG tablet Take 1 tablet (5 mg total) by mouth daily. 11/06/20  Yes Sueanne Margarita, MD  aspirin EC 81 MG tablet Take 1 tablet (81 mg total) by mouth daily. Swallow whole. 11/06/20  Yes Sueanne Margarita, MD  b complex vitamins  capsule Take 1 capsule by mouth daily.   Yes [provider]  Cholecalciferol (VITAMIN D3) 5000 units CAPS Take 5,000 Units by mouth daily.   Yes [provider]  diclofenac Sodium (VOLTAREN) 1 % GEL Apply 2 g topically 4 (four) times daily.   Yes [provider]  icosapent Ethyl (VASCEPA) 1 g capsule Take 2 capsules (2 g total) by mouth 2 (two) times daily. 11/13/20  Yes Turner, Eber Hong, MD  lisinopril (ZESTRIL) 5 MG tablet Take 1 tablet (5 mg total) by mouth every evening. 11/06/20  Yes Turner, Eber Hong, MD  magnesium oxide (MAG-OX) 400 MG tablet Take 400 mg by mouth daily.   Yes [provider]  meloxicam (MOBIC) 15 MG tablet Take 15 mg by mouth daily. 08/14/20  Yes [provider]  metoprolol succinate  (TOPROL-XL) 25 MG 24 hr tablet Take 1 tablet (25 mg total) by mouth daily. 11/06/20  Yes Turner, Eber Hong, MD  NITROSTAT 0.4 MG SL tablet Place 0.4 mg under the tongue every 5 (five) minutes as needed. 07/06/14  Yes [provider]  oxyCODONE (ROXICODONE) 5 MG immediate release tablet Take 1 tablet (5 mg total) by mouth every 4 (four) hours as needed for severe pain. 12/09/20  Yes Tacy Learn, PA-C  pantoprazole (PROTONIX) 40 MG tablet TAKE 1 TABLET(40 MG) BY MOUTH TWICE DAILY BEFORE A MEAL Patient taking differently: Take 40 mg by mouth daily. 09/19/18  Yes Mahala Menghini, PA-C  rosuvastatin (CRESTOR) 40 MG tablet Take 1 tablet (40 mg total) by mouth daily. 11/13/20 02/11/21 Yes Turner, Eber Hong, MD  tamsulosin (FLOMAX) 0.4 MG CAPS capsule Take 0.4 mg by mouth daily.  11/25/15  Yes [provider]  niacin (SLO-NIACIN) 500 MG tablet Take 500 mg by mouth daily.  Patient not taking: Reported on 01/04/2021    [provider]  predniSONE (STERAPRED UNI-PAK 21 TAB) 10 MG (21) TBPK tablet Take by mouth daily. Take 6 tabs by mouth daily  for 2 days, then 5 tabs for 2 days, then 4 tabs for 2 days, then 3 tabs for 2 days, 2 tabs for 2 days, then 1 tab by mouth daily for 2 days Patient not taking: Reported on 01/04/2021 12/09/20   Tacy Learn, PA-C    Allergies    Penicillins and Shellfish allergy  Review of Systems   Review of Systems  All other systems reviewed and are negative.  Physical Exam Updated Vital Signs BP 118/78   Pulse 82   Temp 98.2 F (36.8 C)   Resp 18   Ht 6\' 3"  (1.905 m)   Wt 89.8 kg   SpO2 100%   BMI 24.75 kg/m   Physical Exam Vitals and nursing note reviewed.  Constitutional:      General: He is not in acute distress.    Appearance: Normal appearance.  HENT:     Head: Normocephalic and atraumatic.  Eyes:     General:        Right eye: No discharge.        Left eye: No discharge.  Cardiovascular:     Comments: Regular rate and rhythm.   S1/S2 are distinct without any evidence of murmur, rubs, or gallops.  Radial pulses are 2+ bilaterally.  Dorsalis pedis pulses are 2+ bilaterally.  No evidence of pedal edema. Pulmonary:     Comments: Clear to auscultation bilaterally.  Normal effort.  No respiratory distress.  No evidence of wheezes, rales, or rhonchi heard  throughout. Abdominal:     General: Abdomen is flat. Bowel sounds are normal. There is no distension.     Tenderness: There is no abdominal tenderness. There is no guarding or rebound.  Musculoskeletal:        General: Normal range of motion.     Cervical back: Neck supple.     Comments: No obvious redness or swelling over the large joint spaces.  Joints were nontender to palpation.  Skin:    General: Skin is warm and dry.     Findings: No rash.  Neurological:     General: No focal deficit present.     Mental Status: He is alert.  Psychiatric:        Mood and Affect: Mood normal.        Behavior: Behavior normal.    ED Results / Procedures / Treatments   Labs (all labs ordered are listed, but only abnormal results are displayed) Labs Reviewed  COMPREHENSIVE METABOLIC PANEL - Abnormal; Notable for the following components:      Result Value   Sodium 130 (*)    CO2 19 (*)    Glucose, Bld 111 (*)    Creatinine, Ser 1.46 (*)    Albumin 3.2 (*)    GFR, Estimated 50 (*)    All other components within normal limits  CBC WITH DIFFERENTIAL/PLATELET - Abnormal; Notable for the following components:   RBC 3.46 (*)    Hemoglobin 9.5 (*)    HCT 28.7 (*)    RDW 15.7 (*)    All other components within normal limits  TROPONIN I (HIGH SENSITIVITY) - Abnormal; Notable for the following components:   Troponin I (High Sensitivity) 47 (*)    All other components within normal limits  TROPONIN I (HIGH SENSITIVITY) - Abnormal; Notable for the following components:   Troponin I (High Sensitivity) 49 (*)    All other components within normal limits  RESP PANEL BY RT-PCR (FLU  A&B, COVID) [id number]    EKG EKG Interpretation  Date/Time:  Saturday January 04 2021 16:46:09 EST Ventricular Rate:  76 PR Interval:  178 QRS Duration: 100 QT Interval:  406 QTC Calculation: 457 R Axis:   22 Text Interpretation: Sinus rhythm Low voltage, precordial leads Confirmed by Milton Ferguson [phone]) on 01/04/2021 5:58:09 PM  Radiology DG Chest 2 View  Result Date: 01/04/2021 CLINICAL DATA:  Chest pain. EXAM: CHEST - 2 VIEW COMPARISON:  Chest x-ray dated 12/09/2020 FINDINGS: Heart size and mediastinal contours are within normal limits. Lungs are clear. No pleural effusion or pneumothorax is seen. Osseous structures about the chest are unremarkable IMPRESSION: No active cardiopulmonary disease. No evidence of pneumonia or pulmonary edema. Electronically Signed   By: Franki Cabot M.D.   On: 01/04/2021 16:53    Procedures Procedures   Medications Ordered in ED Medications  acetaminophen (TYLENOL) tablet 650 mg (650 mg Oral Given 01/04/21 1646)    ED Course  I have reviewed the triage vital signs and the nursing notes.  Pertinent labs & imaging results that were available during my care of the patient were reviewed by me and considered in my medical decision making (see chart for details).    MDM Rules/Calculators/A&P                          Millie Shorb is a 75 y.o. male presents the emergency department with generalized arthralgias.  Given his history of MI I will obtain a troponin  and EKG to evaluate for possible ACS.  We will also get basic labs and a chest x-ray in addition to respiratory panel to test him for COVID and flu.  I will also give him a dose of Tylenol.  CBC was without any significant leukocytosis.  Did show chronic ongoing anemia.  CMP showed mild hyponatremia elevated glucose and elevated creatinine.  Previous records reviewed which shows his creatinine level is better than previous.  Initial delta troponin were slightly elevated.  Old records reviewed  which showed that he has chronically elevated troponins.  COVID and influenza were negative.  Chest x-ray was negative.  Into clinical scenario, it is unclear exactly what is causing his generalized arthralgias.  However, on reevaluation patient was feeling better and wishes to go home.  He has an appointment scheduled with his primary care doctor on Tuesday.  Strict return precautions are given.  He is safe for discharge.   Final Clinical Impression(s) / ED Diagnoses Final diagnoses:  Arthralgia, unspecified joint  Elevated troponin    Rx / DC Orders ED Discharge Orders     None        Cherrie Gauze 01/04/21 Dorthea Cove, MD 01/04/21 2300

## 2021-01-06 DIAGNOSIS — D509 Iron deficiency anemia, unspecified: Secondary | ICD-10-CM | POA: Insufficient documentation

## 2021-01-06 DIAGNOSIS — N1831 Chronic kidney disease, stage 3a: Secondary | ICD-10-CM | POA: Insufficient documentation

## 2021-01-14 DIAGNOSIS — R112 Nausea with vomiting, unspecified: Secondary | ICD-10-CM | POA: Insufficient documentation

## 2021-01-16 ENCOUNTER — Other Ambulatory Visit: Payer: Self-pay

## 2021-01-16 DIAGNOSIS — I77810 Thoracic aortic ectasia: Secondary | ICD-10-CM

## 2021-01-18 ENCOUNTER — Emergency Department (HOSPITAL_COMMUNITY): Payer: Medicare HMO

## 2021-01-18 ENCOUNTER — Emergency Department (HOSPITAL_COMMUNITY)
Admission: EM | Admit: 2021-01-18 | Discharge: 2021-01-18 | Disposition: A | Discharge status: Home / Self Care | Payer: Medicare HMO | Attending: Emergency Medicine | Admitting: Emergency Medicine

## 2021-01-18 ENCOUNTER — Other Ambulatory Visit: Payer: Self-pay

## 2021-01-18 ENCOUNTER — Encounter (HOSPITAL_COMMUNITY): Payer: Self-pay

## 2021-01-18 DIAGNOSIS — Z87891 Personal history of nicotine dependence: Secondary | ICD-10-CM | POA: Insufficient documentation

## 2021-01-18 DIAGNOSIS — I2510 Atherosclerotic heart disease of native coronary artery without angina pectoris: Secondary | ICD-10-CM | POA: Insufficient documentation

## 2021-01-18 DIAGNOSIS — M25561 Pain in right knee: Secondary | ICD-10-CM | POA: Insufficient documentation

## 2021-01-18 DIAGNOSIS — R52 Pain, unspecified: Secondary | ICD-10-CM

## 2021-01-18 DIAGNOSIS — J449 Chronic obstructive pulmonary disease, unspecified: Secondary | ICD-10-CM | POA: Insufficient documentation

## 2021-01-18 DIAGNOSIS — Z20822 Contact with and (suspected) exposure to covid-19: Secondary | ICD-10-CM | POA: Diagnosis not present

## 2021-01-18 DIAGNOSIS — I110 Hypertensive heart disease with heart failure: Secondary | ICD-10-CM | POA: Insufficient documentation

## 2021-01-18 DIAGNOSIS — M353 Polymyalgia rheumatica: Secondary | ICD-10-CM | POA: Diagnosis not present

## 2021-01-18 DIAGNOSIS — I509 Heart failure, unspecified: Secondary | ICD-10-CM | POA: Insufficient documentation

## 2021-01-18 DIAGNOSIS — R519 Headache, unspecified: Secondary | ICD-10-CM | POA: Insufficient documentation

## 2021-01-18 DIAGNOSIS — M25562 Pain in left knee: Secondary | ICD-10-CM | POA: Diagnosis present

## 2021-01-18 LAB — CBC WITH DIFFERENTIAL/PLATELET
Abs Immature Granulocytes: 0.03 10*3/uL (ref 0.00–0.07)
Basophils Absolute: 0 10*3/uL (ref 0.0–0.1)
Basophils Relative: 0 %
Eosinophils Absolute: 0 10*3/uL (ref 0.0–0.5)
Eosinophils Relative: 0 %
HCT: 30.6 % — ABNORMAL LOW (ref 39.0–52.0)
Hemoglobin: 9.9 g/dL — ABNORMAL LOW (ref 13.0–17.0)
Immature Granulocytes: 0 %
Lymphocytes Relative: 11 %
Lymphs Abs: 0.8 10*3/uL (ref 0.7–4.0)
MCH: 26.3 pg (ref 26.0–34.0)
MCHC: 32.4 g/dL (ref 30.0–36.0)
MCV: 81.4 fL (ref 80.0–100.0)
Monocytes Absolute: 0.6 10*3/uL (ref 0.1–1.0)
Monocytes Relative: 9 %
Neutro Abs: 5.9 10*3/uL (ref 1.7–7.7)
Neutrophils Relative %: 80 %
Platelets: 235 10*3/uL (ref 150–400)
RBC: 3.76 MIL/uL — ABNORMAL LOW (ref 4.22–5.81)
RDW: 14.6 % (ref 11.5–15.5)
WBC: 7.3 10*3/uL (ref 4.0–10.5)
nRBC: 0 % (ref 0.0–0.2)

## 2021-01-18 LAB — COMPREHENSIVE METABOLIC PANEL
ALT: 17 U/L (ref 0–44)
AST: 18 U/L (ref 15–41)
Albumin: 3 g/dL — ABNORMAL LOW (ref 3.5–5.0)
Alkaline Phosphatase: 89 U/L (ref 38–126)
Anion gap: 12 (ref 5–15)
BUN: 17 mg/dL (ref 8–23)
CO2: 21 mmol/L — ABNORMAL LOW (ref 22–32)
Calcium: 9.6 mg/dL (ref 8.9–10.3)
Chloride: 98 mmol/L (ref 98–111)
Creatinine, Ser: 1.24 mg/dL (ref 0.61–1.24)
GFR, Estimated: >60 mL/min (ref >60)
Glucose, Bld: 120 mg/dL — ABNORMAL HIGH (ref 70–99)
Potassium: 4 mmol/L (ref 3.5–5.1)
Sodium: 131 mmol/L — ABNORMAL LOW (ref 135–145)
Total Bilirubin: 0.6 mg/dL (ref 0.3–1.2)
Total Protein: 7.9 g/dL (ref 6.5–8.1)

## 2021-01-18 LAB — RESP PANEL BY RT-PCR (FLU A&B, COVID) ARPGX2
Influenza A by PCR: NEGATIVE
Influenza B by PCR: NEGATIVE
SARS Coronavirus 2 by RT PCR: NEGATIVE

## 2021-01-18 LAB — SEDIMENTATION RATE: Sed Rate: 136 mm/hr — ABNORMAL HIGH (ref 0–16)

## 2021-01-18 LAB — CK: Total CK: 70 U/L (ref 49–397)

## 2021-01-18 MED ORDER — ONDANSETRON HCL 4 MG/2ML IJ SOLN
4.0000 mg | Freq: Once | INTRAMUSCULAR | Status: AC
  Administered 2021-01-18: 4 mg via INTRAVENOUS
  Filled 2021-01-18: qty 2

## 2021-01-18 MED ORDER — SODIUM CHLORIDE 0.9 % IV BOLUS
500.0000 mL | Freq: Once | INTRAVENOUS | Status: AC
  Administered 2021-01-18: 500 mL via INTRAVENOUS

## 2021-01-18 MED ORDER — MORPHINE SULFATE (PF) 4 MG/ML IV SOLN
4.0000 mg | Freq: Once | INTRAVENOUS | Status: AC
  Administered 2021-01-18: 4 mg via INTRAVENOUS
  Filled 2021-01-18: qty 1

## 2021-01-18 NOTE — ED Notes (Signed)
Pt and family taken to lobby and assisted in car with new personal wheelchair.

## 2021-01-18 NOTE — ED Notes (Addendum)
Got Pt socks ,got him pillow and a warm blanket

## 2021-01-18 NOTE — ED Provider Notes (Signed)
5:05 PM-patient presented earlier today for evaluation of joint pain and fall.  The joint pain is ongoing and has required prior evaluations.  It is felt that he requires a wheelchair to help with his ambulation because of his joint pain.  This has been requested from case management.  Diagnosis for necessity of wheelchair is, arthralgia.  Associated comorbidities includes fall secondary to arthralgia.   Daleen Bo, MD 01/18/21 [phone]

## 2021-01-18 NOTE — ED Triage Notes (Signed)
PER EMS: pt from home with c/o chronic bilateral arm and leg pain, onset 4 months ago. He states he has been seen here multiple times every week for same pain and has received no definitive answer other than possible arthritis. Denies any other complaints.  BP-131/77, HR-84, 98%

## 2021-01-18 NOTE — ED Notes (Signed)
Patient has a mobility limitation that significantly impairs their ability to participate in one or more mobility related activities of daily living in the home.

## 2021-01-18 NOTE — ED Notes (Signed)
Pt family member upset that they do not have the wheelchair yet. Advised that they are aware of the request and there have been some delays but assured the chair will be brought to them.

## 2021-01-18 NOTE — Discharge Instructions (Signed)
I have arranged for a wheelchair to be sent home with you and will have the home health team follow up with you at your house for an assessment. Please continue your home medications and call your primary care doctor on Monday to get an update on a referral to a rheumatologist. Return to the ED with any new or suddenly worsening symptoms.

## 2021-01-18 NOTE — ED Provider Notes (Signed)
Emergency Department Provider Note   I have reviewed the triage vital signs and the nursing notes.   HISTORY  Chief Complaint Leg Pain   HPI Phillip Pace is a 75 y.o. male with past medical history reviewed below returns to the emergency department with pain in multiple joints.  Patient states that he had a fall yesterday where his knees gave out on him and his left knee got pinned underneath him.  He is especially having pain in that location but states he has had pain in his arms and legs which have progressively limited his mobility since August.  He has had multiple ED presentations with the same complaint and no clear diagnosis.  He has been following with his primary care doctor, Dr. Nevada Crane, who he last saw this past week.  He states that he sent off additional blood work and that there have been discussions regarding referral to rheumatology as an outpatient.  He has been on burst of steroid in the recent past which seem to help briefly but then his pain returns.  He is reporting some mild neck pain.  Denies head injury with the fall last night.  He states he mainly landed on his buttocks.  No loss of consciousness or chest discomfort.  No fevers.  He denies any rash.  No joint swelling or redness.  He does not use a walker or wheelchair at home.  He states prior to this event he was able to walk around not only his home but go on hikes around his property and had no real mobility limitation.    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   Dilated aortic root (HCC)    42 mm by 2D echo 12/2020   Hypertension     Patient Active Problem List   Diagnosis Date Noted   Barrett's esophagus 08/12/2017   History of adenomatous polyp of colon 04/07/2017   Family history of colon cancer 04/07/2017   Dysphagia 04/07/2017    Chest pain 07/20/2014   ARF (acute renal failure) ([place]) 07/20/2014   Hyperglycemia 07/20/2014   Fever 07/20/2014    Past Surgical History:  Procedure Laterality Date   COLONOSCOPY N/A 05/25/2017   Procedure: COLONOSCOPY;  Surgeon: Daneil Dolin, MD;  Location: [facility];  Service: Endoscopy;  Laterality: N/A;  12:00pm   ESOPHAGOGASTRODUODENOSCOPY N/A 05/25/2017   Procedure: ESOPHAGOGASTRODUODENOSCOPY (EGD);  Surgeon: Daneil Dolin, MD;  Location: [facility];  Service: Endoscopy;  Laterality: N/A;   FRACTURE SURGERY     collar bone   HERNIA REPAIR     x 2   MALONEY DILATION N/A 05/25/2017   Procedure: MALONEY DILATION;  Surgeon: Daneil Dolin, MD;  Location: [facility];  Service: Endoscopy;  Laterality: N/A;   stents 13      Allergies Penicillins and Shellfish allergy  Family History  Problem Relation Age of Onset   Cancer - Colon Mother    CAD Mother    Colon cancer Father    Gastric cancer Neg Hx    Esophageal cancer Neg Hx     Social History Social History   Tobacco Use   Smoking status: Former    Types: Cigarettes    Quit date: 02/02/1993    Years since quitting: 56.9  Smokeless tobacco: Never  Vaping Use   Vaping Use: Never used  Substance Use Topics   Alcohol use: No    Alcohol/week: 0.0 standard drinks   Drug use: No    Review of Systems  Constitutional: No fever/chills Eyes: No visual changes. ENT: No sore throat. Cardiovascular: Denies chest pain. Respiratory: Denies shortness of breath. Gastrointestinal: No abdominal pain.  No nausea, no vomiting.  No diarrhea.  No constipation. Genitourinary: Negative for dysuria. Musculoskeletal: Positive throughout the extremities, worse in the left knee after fall.  Skin: Negative for rash. Neurological: Negative for headaches, focal weakness or numbness.  10-point ROS otherwise negative.  ____________________________________________   PHYSICAL EXAM:  VITAL SIGNS: ED Triage Vitals   Enc Vitals Group     BP 01/18/21 1057 (!) 142/73     Pulse Rate 01/18/21 1057 80     Resp 01/18/21 1057 18     Temp 01/18/21 1057 98.6 F (37 C)     Temp Source 01/18/21 1057 Oral     SpO2 01/18/21 1057 99 %     Weight 01/18/21 1056 198 lb (89.8 kg)     Height 01/18/21 1056 6' 3"  (1.905 m)   Constitutional: Alert and oriented. Well appearing and in no acute distress. Eyes: Conjunctivae are normal.  Head: Atraumatic. Nose: No congestion/rhinnorhea. Mouth/Throat: Mucous membranes are moist.   Neck: No stridor.   Cardiovascular: Normal rate, regular rhythm. Good peripheral circulation. Grossly normal heart sounds.   Respiratory: Normal respiratory effort.  No retractions. Lungs CTAB. Gastrointestinal: Soft and nontender. No distention.  Musculoskeletal: No focal joint redness, swelling, warmth.  He does have some tenderness diffusely through the bilateral knees, slightly worse on the left but clinically little concern for septic joint.  Normal range of motion of the upper extremities but somewhat slow due to pain.  Again no focal joint redness or warmth. Neurologic:  Normal speech and language. No gross focal neurologic deficits are appreciated.  Strength is 5/5 with exam somewhat limited by pain but patient able to complete.  No sensory loss.  Skin:  Skin is warm, dry and intact. No rash noted.  ____________________________________________   LABS (all labs ordered are listed, but only abnormal results are displayed)  Labs Reviewed  COMPREHENSIVE METABOLIC PANEL - Abnormal; Notable for the following components:      Result Value   Sodium 131 (*)    CO2 21 (*)    Glucose, Bld 120 (*)    Albumin 3.0 (*)    All other components within normal limits  CBC WITH DIFFERENTIAL/PLATELET - Abnormal; Notable for the following components:   RBC 3.76 (*)    Hemoglobin 9.9 (*)    HCT 30.6 (*)    All other components within normal limits  SEDIMENTATION RATE - Abnormal; Notable for the  following components:   Sed Rate 136 (*)    All other components within normal limits  C-REACTIVE PROTEIN - Abnormal; Notable for the following components:   CRP 26.9 (*)    All other components within normal limits  RESP PANEL BY RT-PCR (FLU A&B, COVID) [id number]  CK   ____________________________________________  RADIOLOGY  DG Pelvis 1-2 Views  Result Date: 01/18/2021 CLINICAL DATA:  bilateral hip and left knee pain for several months. EXAM: PELVIS - 1-2 VIEW COMPARISON:  None. FINDINGS: No fracture or bone lesion. Hip joints, SI joints and symphysis pubis are normally spaced and aligned. Soft tissues are unremarkable. IMPRESSION: Negative. Electronically Signed   By: Dedra Skeens.D.  On: 01/18/2021 11:59   DG Knee 1-2 Views Left  Result Date: 01/18/2021 CLINICAL DATA:  75 year old male with history of bilateral hip and left knee pain for several months. EXAM: LEFT KNEE - 1-2 VIEW COMPARISON:  Left knee radiograph 11/05/2020. FINDINGS: 2 nonstandard views are submitted for evaluation. These demonstrate the presence of a suprapatellar effusion. No acute displaced fracture, subluxation or dislocation is noted. There is joint space narrowing, subchondral sclerosis, subchondral cyst formation and osteophyte formation, most severe in the patellofemoral compartment. IMPRESSION: 1. 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 2. Suprapatellar effusion. Electronically Signed   By: Vinnie Langton M.D.   On: 01/18/2021 11:58   CT HEAD WO CONTRAST (5MM)  Result Date: 01/18/2021 CLINICAL DATA:  Trauma. EXAM: CT HEAD WITHOUT CONTRAST CT CERVICAL SPINE WITHOUT CONTRAST TECHNIQUE: Multidetector CT imaging of the head and cervical spine was performed following the standard protocol without intravenous contrast. Multiplanar CT image reconstructions of the cervical spine were also generated. COMPARISON:  None. FINDINGS: CT HEAD FINDINGS Brain: No evidence of acute  infarction, hemorrhage, hydrocephalus, extra-axial collection or mass lesion/mass effect. Vascular: No hyperdense vessel or unexpected calcification. Skull: Normal. Negative for fracture or focal lesion. Sinuses/Orbits: No acute finding. Mucous retention cysts noted in the left maxillary sinus. Trace mucosal thickening in the right frontal sinus. Other: None. CT CERVICAL SPINE FINDINGS Alignment: Slight reversal of the normal cervical lordosis with trace 2-3 mm anterolisthesis C3 on C4. Skull base and vertebrae: No acute fracture. No primary bone lesion or focal pathologic process. Soft tissues and spinal canal: No prevertebral fluid or swelling. No visible canal hematoma. Disc levels: Severe osteoarthritis at the atlantodental joint. Mild degenerative disc disease at C2-C3 and C3-C4 with loss of disc space height and tiny anterior osteophytes. Severe degenerative disc disease at C4-T1 with osseous fusion across the C5-C6 disc level. Multilevel moderate to severe hypertrophic facet arthropathy, most severe at the bilateral C2-C3 and C3-C4 facet joints. Uncovertebral spurring, in combination with hypertrophic facet arthropathy, results in severe neural foraminal narrowing at the left C4-C5, left C5-C6, and right C6-C7 neural foramina as well as moderate narrowing at the right C4-C5 C5-C6 and left C6-C7 foramina. Upper chest: Trace secretions noted in the upper trachea. Minimal dependent atelectasis in the lung apices. Other: None. IMPRESSION: CT head: No acute intracranial abnormality. CT cervical spine: 1. No fracture or traumatic malalignment of the cervical spine. 2. Multilevel degenerative disc disease, most severe at the mid to lower cervical spine levels with complete osseous fusion across the C5-C6 disc space. Severe neural foraminal narrowing at the left C4-C5 and C5-C6 and right C6-C7 foramina. Electronically Signed   By: Ileana Roup M.D.   On: 01/18/2021 15:10   CT Cervical Spine Wo Contrast  Result  Date: 01/18/2021 CLINICAL DATA:  Trauma. EXAM: CT HEAD WITHOUT CONTRAST CT CERVICAL SPINE WITHOUT CONTRAST TECHNIQUE: Multidetector CT imaging of the head and cervical spine was performed following the standard protocol without intravenous contrast. Multiplanar CT image reconstructions of the cervical spine were also generated. COMPARISON:  None. FINDINGS: CT HEAD FINDINGS Brain: No evidence of acute infarction, hemorrhage, hydrocephalus, extra-axial collection or mass lesion/mass effect. Vascular: No hyperdense vessel or unexpected calcification. Skull: Normal. Negative for fracture or focal lesion. Sinuses/Orbits: No acute finding. Mucous retention cysts noted in the left maxillary sinus. Trace mucosal thickening in the right frontal sinus. Other: None. CT CERVICAL SPINE FINDINGS Alignment: Slight reversal of the normal cervical lordosis with trace 2-3 mm anterolisthesis C3 on C4. Skull  base and vertebrae: No acute fracture. No primary bone lesion or focal pathologic process. Soft tissues and spinal canal: No prevertebral fluid or swelling. No visible canal hematoma. Disc levels: Severe osteoarthritis at the atlantodental joint. Mild degenerative disc disease at C2-C3 and C3-C4 with loss of disc space height and tiny anterior osteophytes. Severe degenerative disc disease at C4-T1 with osseous fusion across the C5-C6 disc level. Multilevel moderate to severe hypertrophic facet arthropathy, most severe at the bilateral C2-C3 and C3-C4 facet joints. Uncovertebral spurring, in combination with hypertrophic facet arthropathy, results in severe neural foraminal narrowing at the left C4-C5, left C5-C6, and right C6-C7 neural foramina as well as moderate narrowing at the right C4-C5 C5-C6 and left C6-C7 foramina. Upper chest: Trace secretions noted in the upper trachea. Minimal dependent atelectasis in the lung apices. Other: None. IMPRESSION: CT head: No acute intracranial abnormality. CT cervical spine: 1. No fracture  or traumatic malalignment of the cervical spine. 2. Multilevel degenerative disc disease, most severe at the mid to lower cervical spine levels with complete osseous fusion across the C5-C6 disc space. Severe neural foraminal narrowing at the left C4-C5 and C5-C6 and right C6-C7 foramina. Electronically Signed   By: Ileana Roup M.D.   On: 01/18/2021 15:10   CT Knee Left Wo Contrast  Result Date: 01/18/2021 CLINICAL DATA:  Knee trauma EXAM: CT OF THE left KNEE WITHOUT CONTRAST TECHNIQUE: Multidetector CT imaging of the left knee was performed according to the standard protocol. Multiplanar CT image reconstructions were also generated. COMPARISON:  None. FINDINGS: Bones/Joint/Cartilage No acute fracture identified. Mild narrowing of the medial compartment joint space and patellofemoral joint. Associated subchondral cysts and sclerosis at the patellofemoral joint most prominent at the lateral femoral trochlear facet. Ligaments Suboptimally assessed by CT. Note is made of chronic calcific densities in the MCL and PCL. Muscles and Tendons The extensor mechanism appears grossly intact without gross tear or retraction visualized. Musculature is grossly unremarkable. Soft tissues Moderate to large joint effusion. IMPRESSION: 1. No acute osseous abnormality identified. 2. Moderate to large joint effusion. 3. Degenerative changes most significant at the patellofemoral joint. Electronically Signed   By: Ofilia Neas M.D.   On: 01/18/2021 15:10    ____________________________________________   PROCEDURES  Procedure(s) performed:   Procedures  None ____________________________________________   INITIAL IMPRESSION / ASSESSMENT AND PLAN / ED COURSE  Pertinent labs & imaging results that were available during my care of the patient were reviewed by me and considered in my medical decision making (see chart for details).   Patient presents to the emergency department with continued polymyalgias.  He has  pain in the joints as well as the muscles.  No new medications to explain symptoms.  He arrives afebrile with normal vital signs.  He has been on steroid burst with tapers, pain medication, and muscle relaxer medications.  He has been to the emergency department multiple times with similar complaint.  ESR and CRP of been mildly elevated but no clear diagnosis has been made in the emergency setting.  Patient did see his PCP this past week and states that additional lab work was sent and there was discussion about rheumatologic cause for his symptoms.  I attempted to reach out to the office for transfer of records/review but they were unable to provide me with additional information.   After IV pain medications here he is somewhat improved.  I do not see evidence of a neurologic such as cord compression of the spine.  No injury  of the head or neck during fall yesterday.  The x-ray of the left knee shows severe tricompartmental arthritis which may be contributing to pain but does not explain pain in other extremities.   Did perform a CK which came back within normal limits.  Patient has very mild hyponatremia with normal kidney function.  No leukocytosis.  Sedimentation rate is mildly elevated at 136.  COVID and flu are negative.   Do plan for CT imaging of the left knee as well as head and cervical spine to evaluate more completely for occult fracture.  Patient has ongoing rheumatologic work-up in process.  We do not have rheumatologic coverage at this hospital for consultation.  I have reached out to case management who will arrange for home health and will also arrange for a wheelchair to be delivered here to the emergency department for the patient to use at home.  ____________________________________________  FINAL CLINICAL IMPRESSION(S) / ED DIAGNOSES  Final diagnoses:  Polymyalgia ([place])    MEDICATIONS GIVEN DURING THIS VISIT:  Medications  sodium chloride 0.9 % bolus 500 mL (0 mLs Intravenous  Stopped 01/18/21 1351)  morphine 4 MG/ML injection 4 mg (4 mg Intravenous Given 01/18/21 1152)  ondansetron (ZOFRAN) injection 4 mg (4 mg Intravenous Given 01/18/21 1152)    Note:  This document was prepared using Dragon voice recognition software and may include unintentional dictation errors.  Nanda Quinton, MD, [facility] Emergency Medicine    Kalika Smay, [facility], MD 01/19/21 [phone]

## 2021-01-18 NOTE — ED Notes (Signed)
Waiting on wheel chair.

## 2021-01-18 NOTE — Clinical Social Work Note (Signed)
CSW referred patient to Georgina Snell with [facility] agreeable to provide [facility] services. CSW referred patient to [facility] with [employer] for WC. Caryl Pina agreeable to provide WC. TOC signing off.

## 2021-01-19 LAB — C-REACTIVE PROTEIN: CRP: 26.9 mg/dL — ABNORMAL HIGH (ref <1.0)

## 2021-01-24 ENCOUNTER — Emergency Department (HOSPITAL_COMMUNITY): Payer: Medicare HMO

## 2021-01-24 ENCOUNTER — Encounter (HOSPITAL_COMMUNITY): Payer: Self-pay | Admitting: *Deleted

## 2021-01-24 ENCOUNTER — Other Ambulatory Visit: Payer: Self-pay

## 2021-01-24 ENCOUNTER — Emergency Department (HOSPITAL_COMMUNITY)
Admission: EM | Admit: 2021-01-24 | Discharge: 2021-01-28 | Disposition: A | Discharge status: Home / Self Care | Payer: Medicare HMO | Attending: Emergency Medicine | Admitting: Emergency Medicine

## 2021-01-24 DIAGNOSIS — I110 Hypertensive heart disease with heart failure: Secondary | ICD-10-CM | POA: Insufficient documentation

## 2021-01-24 DIAGNOSIS — I2510 Atherosclerotic heart disease of native coronary artery without angina pectoris: Secondary | ICD-10-CM | POA: Diagnosis not present

## 2021-01-24 DIAGNOSIS — Z79899 Other long term (current) drug therapy: Secondary | ICD-10-CM | POA: Diagnosis not present

## 2021-01-24 DIAGNOSIS — W19XXXA Unspecified fall, initial encounter: Secondary | ICD-10-CM

## 2021-01-24 DIAGNOSIS — Z86010 Personal history of colonic polyps: Secondary | ICD-10-CM | POA: Insufficient documentation

## 2021-01-24 DIAGNOSIS — R2689 Other abnormalities of gait and mobility: Secondary | ICD-10-CM | POA: Diagnosis not present

## 2021-01-24 DIAGNOSIS — M353 Polymyalgia rheumatica: Secondary | ICD-10-CM | POA: Insufficient documentation

## 2021-01-24 DIAGNOSIS — R000 Tachycardia, unspecified: Secondary | ICD-10-CM | POA: Insufficient documentation

## 2021-01-24 DIAGNOSIS — Z7982 Long term (current) use of aspirin: Secondary | ICD-10-CM | POA: Diagnosis not present

## 2021-01-24 DIAGNOSIS — Z20822 Contact with and (suspected) exposure to covid-19: Secondary | ICD-10-CM | POA: Diagnosis not present

## 2021-01-24 DIAGNOSIS — J449 Chronic obstructive pulmonary disease, unspecified: Secondary | ICD-10-CM | POA: Insufficient documentation

## 2021-01-24 DIAGNOSIS — I509 Heart failure, unspecified: Secondary | ICD-10-CM | POA: Diagnosis not present

## 2021-01-24 DIAGNOSIS — Z87891 Personal history of nicotine dependence: Secondary | ICD-10-CM | POA: Diagnosis not present

## 2021-01-24 DIAGNOSIS — R262 Difficulty in walking, not elsewhere classified: Secondary | ICD-10-CM

## 2021-01-24 LAB — COMPREHENSIVE METABOLIC PANEL
ALT: 46 U/L — ABNORMAL HIGH (ref 0–44)
AST: 47 U/L — ABNORMAL HIGH (ref 15–41)
Albumin: 2.7 g/dL — ABNORMAL LOW (ref 3.5–5.0)
Alkaline Phosphatase: 146 U/L — ABNORMAL HIGH (ref 38–126)
Anion gap: 11 (ref 5–15)
BUN: 25 mg/dL — ABNORMAL HIGH (ref 8–23)
CO2: 20 mmol/L — ABNORMAL LOW (ref 22–32)
Calcium: 9.1 mg/dL (ref 8.9–10.3)
Chloride: 98 mmol/L (ref 98–111)
Creatinine, Ser: 1.09 mg/dL (ref 0.61–1.24)
GFR, Estimated: >60 mL/min (ref >60)
Glucose, Bld: 178 mg/dL — ABNORMAL HIGH (ref 70–99)
Potassium: 4 mmol/L (ref 3.5–5.1)
Sodium: 129 mmol/L — ABNORMAL LOW (ref 135–145)
Total Bilirubin: 0.5 mg/dL (ref 0.3–1.2)
Total Protein: 7.9 g/dL (ref 6.5–8.1)

## 2021-01-24 LAB — CBC WITH DIFFERENTIAL/PLATELET
Abs Immature Granulocytes: 0.06 10*3/uL (ref 0.00–0.07)
Basophils Absolute: 0 10*3/uL (ref 0.0–0.1)
Basophils Relative: 0 %
Eosinophils Absolute: 0 10*3/uL (ref 0.0–0.5)
Eosinophils Relative: 0 %
HCT: 31.3 % — ABNORMAL LOW (ref 39.0–52.0)
Hemoglobin: 10.3 g/dL — ABNORMAL LOW (ref 13.0–17.0)
Immature Granulocytes: 1 %
Lymphocytes Relative: 7 %
Lymphs Abs: 0.6 10*3/uL — ABNORMAL LOW (ref 0.7–4.0)
MCH: 27.1 pg (ref 26.0–34.0)
MCHC: 32.9 g/dL (ref 30.0–36.0)
MCV: 82.4 fL (ref 80.0–100.0)
Monocytes Absolute: 0.5 10*3/uL (ref 0.1–1.0)
Monocytes Relative: 6 %
Neutro Abs: 7.6 10*3/uL (ref 1.7–7.7)
Neutrophils Relative %: 86 %
Platelets: 314 10*3/uL (ref 150–400)
RBC: 3.8 MIL/uL — ABNORMAL LOW (ref 4.22–5.81)
RDW: 14.8 % (ref 11.5–15.5)
WBC: 8.8 10*3/uL (ref 4.0–10.5)
nRBC: 0 % (ref 0.0–0.2)

## 2021-01-24 LAB — CBG MONITORING, ED: Glucose-Capillary: 192 mg/dL — ABNORMAL HIGH (ref 70–99)

## 2021-01-24 LAB — URINALYSIS, ROUTINE W REFLEX MICROSCOPIC
Bilirubin Urine: NEGATIVE
Glucose, UA: NEGATIVE mg/dL
Hgb urine dipstick: NEGATIVE
Ketones, ur: NEGATIVE mg/dL
Leukocytes,Ua: NEGATIVE
Nitrite: NEGATIVE
Protein, ur: NEGATIVE mg/dL
Specific Gravity, Urine: 1.015 (ref 1.005–1.030)
pH: 5.5 (ref 5.0–8.0)

## 2021-01-24 LAB — RESP PANEL BY RT-PCR (FLU A&B, COVID) ARPGX2
Influenza A by PCR: NEGATIVE
Influenza B by PCR: NEGATIVE
SARS Coronavirus 2 by RT PCR: NEGATIVE

## 2021-01-24 MED ORDER — ASPIRIN EC 81 MG PO TBEC
81.0000 mg | DELAYED_RELEASE_TABLET | Freq: Every day | ORAL | Status: DC
  Administered 2021-01-25 – 2021-01-28 (×4): 81 mg via ORAL
  Filled 2021-01-24 (×4): qty 1

## 2021-01-24 MED ORDER — TAMSULOSIN HCL 0.4 MG PO CAPS
0.4000 mg | ORAL_CAPSULE | Freq: Every day | ORAL | Status: DC
  Administered 2021-01-25 – 2021-01-28 (×4): 0.4 mg via ORAL
  Filled 2021-01-24 (×4): qty 1

## 2021-01-24 MED ORDER — OXYCODONE HCL 5 MG PO TABS
5.0000 mg | ORAL_TABLET | ORAL | Status: DC | PRN
  Administered 2021-01-24 – 2021-01-28 (×14): 5 mg via ORAL
  Filled 2021-01-24 (×14): qty 1

## 2021-01-24 MED ORDER — METOPROLOL SUCCINATE ER 25 MG PO TB24: 25.0000 mg | ORAL_TABLET | Freq: Every day | ORAL | Status: DC

## 2021-01-24 MED ORDER — AMLODIPINE BESYLATE 5 MG PO TABS: 5.0000 mg | ORAL_TABLET | Freq: Every day | ORAL | Status: DC

## 2021-01-24 MED ORDER — ROSUVASTATIN CALCIUM 20 MG PO TABS
40.0000 mg | ORAL_TABLET | Freq: Every day | ORAL | Status: DC
  Administered 2021-01-25 – 2021-01-28 (×4): 40 mg via ORAL
  Filled 2021-01-24 (×4): qty 2

## 2021-01-24 MED ORDER — PANTOPRAZOLE SODIUM 40 MG PO TBEC
40.0000 mg | DELAYED_RELEASE_TABLET | Freq: Every day | ORAL | Status: DC
  Administered 2021-01-24 – 2021-01-28 (×5): 40 mg via ORAL
  Filled 2021-01-24 (×5): qty 1

## 2021-01-24 MED ORDER — ICOSAPENT ETHYL 1 G PO CAPS
2.0000 g | ORAL_CAPSULE | Freq: Two times a day (BID) | ORAL | Status: DC
  Administered 2021-01-25 – 2021-01-28 (×5): 2 g via ORAL
  Filled 2021-01-24 (×11): qty 2

## 2021-01-24 MED ORDER — B COMPLEX-C PO TABS
1.0000 | ORAL_TABLET | Freq: Every day | ORAL | Status: DC
  Administered 2021-01-25 – 2021-01-28 (×4): 1 via ORAL
  Filled 2021-01-24 (×4): qty 1

## 2021-01-24 MED ORDER — VITAMIN D 25 MCG (1000 UNIT) PO TABS
5000.0000 [IU] | ORAL_TABLET | Freq: Every day | ORAL | Status: DC
Start: 2021-01-25 — End: 2021-01-29
  Administered 2021-01-25 – 2021-01-28 (×4): 5000 [IU] via ORAL
  Filled 2021-01-24 (×5): qty 5

## 2021-01-24 MED ORDER — MORPHINE SULFATE (PF) 4 MG/ML IV SOLN
4.0000 mg | Freq: Once | INTRAVENOUS | Status: AC
  Administered 2021-01-24: 14:00:00 4 mg via INTRAVENOUS
  Filled 2021-01-24: qty 1

## 2021-01-24 MED ORDER — DICLOFENAC SODIUM 1 % EX GEL
2.0000 g | Freq: Four times a day (QID) | CUTANEOUS | Status: DC
  Administered 2021-01-25 – 2021-01-28 (×7): 2 g via TOPICAL
  Filled 2021-01-24 (×2): qty 100

## 2021-01-24 MED ORDER — LISINOPRIL 5 MG PO TABS: 5.0000 mg | ORAL_TABLET | Freq: Every evening | ORAL | Status: DC

## 2021-01-24 MED ORDER — SODIUM CHLORIDE 0.9 % IV BOLUS
1000.0000 mL | Freq: Once | INTRAVENOUS | Status: AC
  Administered 2021-01-24: 14:00:00 1000 mL via INTRAVENOUS

## 2021-01-24 MED ORDER — AMLODIPINE BESYLATE 5 MG PO TABS
5.0000 mg | ORAL_TABLET | Freq: Every day | ORAL | Status: DC
  Administered 2021-01-25 – 2021-01-28 (×3): 5 mg via ORAL
  Filled 2021-01-24 (×4): qty 1

## 2021-01-24 MED ORDER — MAGNESIUM OXIDE 400 MG PO TABS
400.0000 mg | ORAL_TABLET | Freq: Every day | ORAL | Status: DC
  Administered 2021-01-25 – 2021-01-28 (×4): 400 mg via ORAL
  Filled 2021-01-24 (×10): qty 1

## 2021-01-24 MED ORDER — ASPIRIN EC 81 MG PO TBEC: 81.0000 mg | DELAYED_RELEASE_TABLET | Freq: Every day | ORAL | Status: DC

## 2021-01-24 MED ORDER — TAMSULOSIN HCL 0.4 MG PO CAPS: 0.4000 mg | ORAL_CAPSULE | Freq: Every day | ORAL | Status: DC

## 2021-01-24 MED ORDER — LISINOPRIL 5 MG PO TABS
5.0000 mg | ORAL_TABLET | Freq: Every evening | ORAL | Status: DC
  Administered 2021-01-24 – 2021-01-27 (×4): 5 mg via ORAL
  Filled 2021-01-24 (×4): qty 1

## 2021-01-24 MED ORDER — ROSUVASTATIN CALCIUM 20 MG PO TABS: 40.0000 mg | ORAL_TABLET | Freq: Every day | ORAL | Status: DC

## 2021-01-24 MED ORDER — MELOXICAM 15 MG PO TABS: 15.0000 mg | ORAL_TABLET | Freq: Every day | ORAL | Status: DC

## 2021-01-24 MED ORDER — ALLOPURINOL 100 MG PO TABS
100.0000 mg | ORAL_TABLET | Freq: Every day | ORAL | Status: DC
  Administered 2021-01-25 – 2021-01-28 (×4): 100 mg via ORAL
  Filled 2021-01-24 (×4): qty 1

## 2021-01-24 MED ORDER — ALLOPURINOL 100 MG PO TABS: 100.0000 mg | ORAL_TABLET | Freq: Every day | ORAL | Status: DC

## 2021-01-24 MED ORDER — METOPROLOL SUCCINATE ER 25 MG PO TB24
25.0000 mg | ORAL_TABLET | Freq: Every day | ORAL | Status: DC
  Administered 2021-01-25 – 2021-01-28 (×4): 25 mg via ORAL
  Filled 2021-01-24 (×4): qty 1

## 2021-01-24 MED ORDER — ONDANSETRON HCL 4 MG/2ML IJ SOLN
4.0000 mg | Freq: Once | INTRAMUSCULAR | Status: AC
  Administered 2021-01-24: 14:00:00 4 mg via INTRAVENOUS
  Filled 2021-01-24: qty 2

## 2021-01-24 NOTE — ED Notes (Signed)
Family updated at this time.

## 2021-01-24 NOTE — ED Triage Notes (Signed)
Pt arrived via [facility] for a fall that occurred on Friday.  Pt with generalized pain all over and reported that pt had not been getting up.  Per report, pt was found sitting in the recliner and reported that a neighbor had been lifting pt .  No BM since fall per report.  Hx of arthritis.

## 2021-01-24 NOTE — ED Notes (Signed)
CSW notified that PT is recommending SNF for pt at d/c. CSW spoke with pt about PT recommendation. Pt is agreeable to SNF referral and does not have a preference in facility. CSW completed Fl2 and PASRR. CSW sent Fl2 out to local facilities. TOC to to start auth when PT notes uploaded. TOC to follow.

## 2021-01-24 NOTE — ED Notes (Signed)
Patient transported to X-ray 

## 2021-01-24 NOTE — ED Notes (Signed)
Pt states he landed on his bottom with fall, denies hitting his head.  Pt denies taking any blood thinners.

## 2021-01-24 NOTE — Plan of Care (Signed)
°  Problem: Acute Rehab PT Goals(only PT should resolve) Goal: Pt Will Go Supine/Side To Sit Outcome: Progressing Flowsheets (Taken 01/24/2021 1627) Pt will go Supine/Side to Sit: with moderate assist Goal: Patient Will Transfer Sit To/From Stand Outcome: Progressing Flowsheets (Taken 01/24/2021 1627) Patient will transfer sit to/from stand: with moderate assist Goal: Pt Will Transfer Bed To Chair/Chair To Bed Outcome: Progressing Flowsheets (Taken 01/24/2021 1627) Pt will Transfer Bed to Chair/Chair to Bed: with mod assist Goal: Pt Will Ambulate Outcome: Progressing Flowsheets (Taken 01/24/2021 1627) Pt will Ambulate:  15 feet  with moderate assist  with rolling walker   4:28 PM, 01/24/21 [name], MPT Physical Therapist with [facility] [phone] office [phone] mobile phone

## 2021-01-24 NOTE — Evaluation (Signed)
Physical Therapy Evaluation Patient Details Name: Phillip Pace MRN: [id number] DOB: 11-May-1945 Today's Date: 01/24/2021  History of Present Illness  Phillip Pace is a 75 y.o. male.     Pt presents to the ED with generalized pain and the inability to ambulate.  Pt said he fell a week ago.  He came to the ED and had multiple scans and x-rays.  No acute injury noted.  He said he was sent home with a wheelchair.  Pt said he's been unable to get out of bed since then.  He has been urinating in a bottle and has been holding his bowels.  He has some people at home, but they are disabled and are unable to help him.  Pt c/o pain all over.  He feels diffusely weak.   Clinical Impression  Patient demonstrates slow labored movement for rolling to side and sitting up from side lying position with c/o severe low back and bilateral knee pain requiring Max assist and unable to attempt sit to stands or transfers due to pain.  Patient required Max assist to put back to bed and reposition.  Patient will benefit from continued skilled physical therapy in hospital and recommended venue below to increase strength, balance, endurance for safe ADLs and gait.        Recommendations for follow up therapy are one component of a multi-disciplinary discharge planning process, led by the attending physician.  Recommendations may be updated based on patient status, additional functional criteria and insurance authorization.  Follow Up Recommendations Acute inpatient rehab (3hours/day)    Assistance Recommended at Discharge Intermittent Supervision/Assistance  Functional Status Assessment Patient has had a recent decline in their functional status and demonstrates the ability to make significant improvements in function in a reasonable and predictable amount of time.  Equipment Recommendations  None recommended by PT    Recommendations for Other Services       Precautions / Restrictions Precautions Precautions:  Fall Restrictions Weight Bearing Restrictions: No      Mobility  Bed Mobility Overal bed mobility: Needs Assistance Bed Mobility: Rolling;Sidelying to Sit;Sit to Sidelying Rolling: Mod assist;Max assist Sidelying to sit: Max assist     Sit to sidelying: Max assist General bed mobility comments: slow labored movement with c/o increased pain in back and knees    Transfers                        Ambulation/Gait                  Stairs            Wheelchair Mobility    Modified Rankin (Stroke Patients Only)       Balance Overall balance assessment: Needs assistance Sitting-balance support: Feet supported;Bilateral upper extremity supported Sitting balance-Leahy Scale: Poor Sitting balance - Comments: seated at EOB                                     Pertinent Vitals/Pain Pain Assessment: 0-10 Pain Score: 9  Pain Location: knees, low back, neck Pain Descriptors / Indicators: Grimacing;Guarding;Moaning;Sore;Constant Pain Intervention(s): Limited activity within patient's tolerance;Monitored during session;Premedicated before session;Repositioned    Home Living Family/patient expects to be discharged to:: Private residence Living Arrangements: Spouse/significant other Available Help at Discharge: Family;Available PRN/intermittently Type of Home: House Home Access: Stairs to enter Entrance Stairs-Rails: None Entrance Stairs-Number of Steps: 1  Home Layout: One level Home Equipment: Conservation officer, nature (2 wheels);Cane - single point;Wheelchair - manual      Prior Function Prior Level of Function : Independent/Modified Independent;Driving             Mobility Comments: Hydrographic surveyor, drives ADLs Comments: Independent     Hand Dominance        Extremity/Trunk Assessment   Upper Extremity Assessment Upper Extremity Assessment: Generalized weakness    Lower Extremity Assessment Lower Extremity Assessment:  Generalized weakness    Cervical / Trunk Assessment Cervical / Trunk Assessment: Normal  Communication   Communication: No difficulties  Cognition Arousal/Alertness: Awake/alert Behavior During Therapy: WFL for tasks assessed/performed;Anxious Overall Cognitive Status: Within Functional Limits for tasks assessed                                          General Comments      Exercises     Assessment/Plan    PT Assessment Patient needs continued PT services  PT Problem List Decreased strength;Decreased balance       PT Treatment Interventions DME instruction;Gait training;Stair training;Functional mobility training;Therapeutic activities;Therapeutic exercise;Balance training;Patient/family education    PT Goals (Current goals can be found in the Care Plan section)  Acute Rehab PT Goals Patient Stated Goal: return home after rehab PT Goal Formulation: With patient/family Time For Goal Achievement: 02/07/21 Potential to Achieve Goals: Good    Frequency Min 3X/week   Barriers to discharge        Co-evaluation               AM-PAC PT "6 Clicks" Mobility  Outcome Measure Help needed turning from your back to your side while in a flat bed without using bedrails?: A Lot Help needed moving from lying on your back to sitting on the side of a flat bed without using bedrails?: A Lot Help needed moving to and from a bed to a chair (including a wheelchair)?: Total Help needed standing up from a chair using your arms (e.g., wheelchair or bedside chair)?: Total Help needed to walk in hospital room?: Total Help needed climbing 3-5 steps with a railing? : Total 6 Click Score: 8    End of Session   Activity Tolerance: Patient tolerated treatment well;Patient limited by fatigue;Patient limited by pain Patient left: in bed;with call bell/phone within reach;with family/visitor present Nurse Communication: Mobility status PT Visit Diagnosis: Unsteadiness on  feet (R26.81);Other abnormalities of gait and mobility (R26.89);Muscle weakness (generalized) (M62.81)    Time: 5188-4166 PT Time Calculation (min) (ACUTE ONLY): 20 min   Charges:   PT Evaluation $PT Eval Moderate Complexity: 1 Mod PT Treatments $Therapeutic Activity: 8-22 mins        4:25 PM, 01/24/21 [name], MPT Physical Therapist with [facility] [phone] office [phone] mobile phone

## 2021-01-24 NOTE — NC FL2 (Signed)
[place] LEVEL OF CARE SCREENING TOOL     IDENTIFICATION  Patient Name: Phillip Pace Birthdate: Apr 04, 1945 Sex: male Admission Date (Current Location): 01/24/2021  [facility] and [place] Number:  [employer] and Address:  [place] [address] [place]      Provider Number: [id number]  Attending Physician Name and Address:  Isla Pence, MD  Relative Name and Phone Number:       Current Level of Care: Hospital Recommended Level of Care: [place] Prior Approval Number:    Date Approved/Denied:   PASRR Number: [id number]  Discharge Plan: SNF    Current Diagnoses: Patient Active Problem List   Diagnosis Date Noted   Barrett's esophagus 08/12/2017   History of adenomatous polyp of colon 04/07/2017   Family history of colon cancer 04/07/2017   Dysphagia 04/07/2017   Chest pain 07/20/2014   ARF (acute renal failure) ([place]) 07/20/2014   Hyperglycemia 07/20/2014   Fever 07/20/2014    Orientation RESPIRATION BLADDER Height & Weight     Self, Time, Situation, Place  Normal Continent Weight:   Height:     BEHAVIORAL SYMPTOMS/MOOD NEUROLOGICAL BOWEL NUTRITION STATUS      Continent Diet  AMBULATORY STATUS COMMUNICATION OF NEEDS Skin   Extensive Assist Verbally Normal                       Personal Care Assistance Level of Assistance  Bathing, Feeding, Dressing, Total care Bathing Assistance: Limited assistance Feeding assistance: Independent Dressing Assistance: Limited assistance Total Care Assistance: Limited assistance   Functional Limitations Info  Sight, Hearing, Speech Sight Info: Adequate Hearing Info: Adequate Speech Info: Adequate    SPECIAL CARE FACTORS FREQUENCY  PT (By licensed PT), OT (By licensed OT)     PT Frequency: 5 times weekly OT Frequency: 5 times weekly            Contractures Contractures Info: Not present    Additional Factors Info  Code Status,  Allergies Code Status Info: FULL Allergies Info: Penicillins and shellfish allergy           Current Medications (01/24/2021):  This is the current hospital active medication list Current Facility-Administered Medications  Medication Dose Route Frequency Provider Last Rate Last Admin   allopurinol (ZYLOPRIM) tablet 100 mg  100 mg Oral Daily Isla Pence, MD       amLODipine (NORVASC) tablet 5 mg  5 mg Oral Daily Isla Pence, MD       aspirin EC tablet 81 mg  81 mg Oral Daily Isla Pence, MD       b complex vitamins capsule 1 capsule  1 capsule Oral Daily Isla Pence, MD       diclofenac Sodium (VOLTAREN) 1 % topical gel 2 g  2 g Topical QID Isla Pence, MD       icosapent Ethyl (VASCEPA) 1 g capsule 2 g  2 g Oral BID Isla Pence, MD       lisinopril (ZESTRIL) tablet 5 mg  5 mg Oral QPM Isla Pence, MD       magnesium oxide (MAG-OX) tablet 400 mg  400 mg Oral Daily Isla Pence, MD       meloxicam (MOBIC) tablet 15 mg  15 mg Oral Daily Isla Pence, MD       metoprolol succinate (TOPROL-XL) 24 hr tablet 25 mg  25 mg Oral Daily Isla Pence, MD       oxyCODONE (  Oxy IR/ROXICODONE) immediate release tablet 5 mg  5 mg Oral Q4H PRN Isla Pence, MD       pantoprazole (PROTONIX) EC tablet 40 mg  40 mg Oral Daily Isla Pence, MD       rosuvastatin (CRESTOR) tablet 40 mg  40 mg Oral Daily Isla Pence, MD       tamsulosin (FLOMAX) capsule 0.4 mg  0.4 mg Oral Daily Isla Pence, MD       Vitamin D3 CAPS 5,000 Units  5,000 Units Oral Daily Isla Pence, MD       Current Outpatient Medications  Medication Sig Dispense Refill   allopurinol (ZYLOPRIM) 100 MG tablet Take 100 mg by mouth daily.     amLODipine (NORVASC) 5 MG tablet Take 1 tablet (5 mg total) by mouth daily. 90 tablet 3   aspirin EC 81 MG tablet Take 1 tablet (81 mg total) by mouth daily. Swallow whole. 90 tablet 3   b complex vitamins capsule Take 1 capsule by mouth daily.      Cholecalciferol (VITAMIN D3) 5000 units CAPS Take 5,000 Units by mouth daily.     diclofenac Sodium (VOLTAREN) 1 % GEL Apply 2 g topically 4 (four) times daily.     icosapent Ethyl (VASCEPA) 1 g capsule Take 2 capsules (2 g total) by mouth 2 (two) times daily. 120 capsule 11   lisinopril (ZESTRIL) 5 MG tablet Take 1 tablet (5 mg total) by mouth every evening. 90 tablet 3   magnesium oxide (MAG-OX) 400 MG tablet Take 400 mg by mouth daily.     meloxicam (MOBIC) 15 MG tablet Take 15 mg by mouth daily.     metoprolol succinate (TOPROL-XL) 25 MG 24 hr tablet Take 1 tablet (25 mg total) by mouth daily. 90 tablet 3   niacin (SLO-NIACIN) 500 MG tablet Take 500 mg by mouth daily.  (Patient not taking: Reported on 01/04/2021)     NITROSTAT 0.4 MG SL tablet Place 0.4 mg under the tongue every 5 (five) minutes as needed.  5   oxyCODONE (ROXICODONE) 5 MG immediate release tablet Take 1 tablet (5 mg total) by mouth every 4 (four) hours as needed for severe pain. 12 tablet 0   pantoprazole (PROTONIX) 40 MG tablet TAKE 1 TABLET(40 MG) BY MOUTH TWICE DAILY BEFORE A MEAL (Patient taking differently: Take 40 mg by mouth daily.) 60 tablet 11   predniSONE (STERAPRED UNI-PAK 21 TAB) 10 MG (21) TBPK tablet Take by mouth daily. Take 6 tabs by mouth daily  for 2 days, then 5 tabs for 2 days, then 4 tabs for 2 days, then 3 tabs for 2 days, 2 tabs for 2 days, then 1 tab by mouth daily for 2 days (Patient not taking: Reported on 01/04/2021) 42 tablet 0   rosuvastatin (CRESTOR) 40 MG tablet Take 1 tablet (40 mg total) by mouth daily. 90 tablet 3   tamsulosin (FLOMAX) 0.4 MG CAPS capsule Take 0.4 mg by mouth daily.        Discharge Medications: Please see discharge summary for a list of discharge medications.  Relevant Imaging Results:  Relevant Lab Results:   Additional Information SSN: [id number]  [place]

## 2021-01-24 NOTE — ED Provider Notes (Signed)
[facility] Provider Note   CSN: [id number] Arrival date & time: 01/24/21  1229     History Chief Complaint  Patient presents with   Phillip Pace is a 75 y.o. male.  Pt presents to the ED with generalized pain and the inability to ambulate.  Pt said he fell a week ago.  He came to the ED and had multiple scans and x-rays.  No acute injury noted.  He said he was sent home with a wheelchair.  Pt said he's been unable to get out of bed since then.  He has been urinating in a bottle and has been holding his bowels.  He has some people at home, but they are disabled and are unable to help him.  Pt c/o pain all over.  He feels diffusely weak.        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   Dilated aortic root (HCC)    42 mm by 2D echo 12/2020   Hypertension     Patient Active Problem List   Diagnosis Date Noted   Barrett's esophagus 08/12/2017   History of adenomatous polyp of colon 04/07/2017   Family history of colon cancer 04/07/2017   Dysphagia 04/07/2017   Chest pain 07/20/2014   ARF (acute renal failure) ([place]) 07/20/2014   Hyperglycemia 07/20/2014   Fever 07/20/2014    Past Surgical History:  Procedure Laterality Date   COLONOSCOPY N/A 05/25/2017   Procedure: COLONOSCOPY;  Surgeon: Daneil Dolin, MD;  Location: [facility];  Service: Endoscopy;  Laterality: N/A;  12:00pm   ESOPHAGOGASTRODUODENOSCOPY N/A 05/25/2017   Procedure: ESOPHAGOGASTRODUODENOSCOPY (EGD);  Surgeon: Daneil Dolin, MD;  Location: [facility];  Service: Endoscopy;  Laterality: N/A;   FRACTURE SURGERY     collar bone   HERNIA REPAIR     x 2   MALONEY DILATION N/A 05/25/2017   Procedure: MALONEY DILATION;  Surgeon: Daneil Dolin, MD;  Location: [facility];  Service: Endoscopy;   Laterality: N/A;   stents 97         Family History  Problem Relation Age of Onset   Cancer - Colon Mother    CAD Mother    Colon cancer Father    Gastric cancer Neg Hx    Esophageal cancer Neg Hx     Social History   Tobacco Use   Smoking status: Former    Types: Cigarettes    Quit date: 02/02/1993    Years since quitting: 27.9   Smokeless tobacco: Never  Vaping Use   Vaping Use: Never used  Substance Use Topics   Alcohol use: No    Alcohol/week: 0.0 standard drinks   Drug use: No    Home Medications Prior to Admission medications   Medication Sig Start Date End Date Taking? Authorizing Provider  allopurinol (ZYLOPRIM) 100 MG tablet Take 100 mg by mouth daily.    [provider]  amLODipine (NORVASC) 5 MG tablet Take 1 tablet (5 mg total) by mouth daily. 11/06/20   Sueanne Margarita, MD  aspirin EC 81 MG tablet Take 1 tablet (81 mg total) by mouth daily. Swallow whole. 11/06/20   Sueanne Margarita, MD  b complex vitamins capsule Take 1 capsule by mouth daily.  [provider]  Cholecalciferol (VITAMIN D3) 5000 units CAPS Take 5,000 Units by mouth daily.    [provider]  diclofenac Sodium (VOLTAREN) 1 % GEL Apply 2 g topically 4 (four) times daily.    [provider]  icosapent Ethyl (VASCEPA) 1 g capsule Take 2 capsules (2 g total) by mouth 2 (two) times daily. 11/13/20   Sueanne Margarita, MD  lisinopril (ZESTRIL) 5 MG tablet Take 1 tablet (5 mg total) by mouth every evening. 11/06/20   Sueanne Margarita, MD  magnesium oxide (MAG-OX) 400 MG tablet Take 400 mg by mouth daily.    [provider]  meloxicam (MOBIC) 15 MG tablet Take 15 mg by mouth daily. 08/14/20   [provider]  metoprolol succinate (TOPROL-XL) 25 MG 24 hr tablet Take 1 tablet (25 mg total) by mouth daily. 11/06/20   Sueanne Margarita, MD  niacin (SLO-NIACIN) 500 MG tablet Take 500 mg by mouth daily.  Patient not taking: Reported on 01/04/2021    [provider]  NITROSTAT 0.4 MG SL tablet Place 0.4 mg under the tongue every 5 (five) minutes as needed. 07/06/14   [provider]  oxyCODONE (ROXICODONE) 5 MG immediate release tablet Take 1 tablet (5 mg total) by mouth every 4 (four) hours as needed for severe pain. 12/09/20   Tacy Learn, PA-C  pantoprazole (PROTONIX) 40 MG tablet TAKE 1 TABLET(40 MG) BY MOUTH TWICE DAILY BEFORE A MEAL Patient taking differently: Take 40 mg by mouth daily. 09/19/18   Mahala Menghini, PA-C  predniSONE (STERAPRED UNI-PAK 21 TAB) 10 MG (21) TBPK tablet Take by mouth daily. Take 6 tabs by mouth daily  for 2 days, then 5 tabs for 2 days, then 4 tabs for 2 days, then 3 tabs for 2 days, 2 tabs for 2 days, then 1 tab by mouth daily for 2 days Patient not taking: Reported on 01/04/2021 12/09/20   Suella Broad A, PA-C  rosuvastatin (CRESTOR) 40 MG tablet Take 1 tablet (40 mg total) by mouth daily. 11/13/20 02/11/21  Sueanne Margarita, MD  tamsulosin (FLOMAX) 0.4 MG CAPS capsule Take 0.4 mg by mouth daily.  11/25/15   [provider]    Allergies    Penicillins and Shellfish allergy  Review of Systems   Review of Systems  Musculoskeletal:  Positive for arthralgias.  Neurological:  Positive for weakness.  All other systems reviewed and are negative.  Physical Exam Updated Vital Signs BP 128/78    Pulse 85    Temp 98.2 F (36.8 C) (Oral)    Resp 17    SpO2 100%   Physical Exam Vitals and nursing note reviewed.  Constitutional:      Appearance: Normal appearance.  HENT:     Head: Normocephalic and atraumatic.     Right Ear: External ear normal.     Left Ear: External ear normal.     Nose: Nose normal.     Mouth/Throat:     Mouth: Mucous membranes are dry.  Eyes:     Extraocular Movements: Extraocular movements intact.     Conjunctiva/sclera: Conjunctivae normal.     Pupils: Pupils are equal, round, and reactive to light.  Cardiovascular:     Rate and Rhythm: Regular rhythm. Tachycardia  present.     Pulses: Normal pulses.     Heart sounds: Normal heart sounds.  Pulmonary:     Effort: Pulmonary effort is normal.     Breath sounds: Normal  breath sounds.  Abdominal:     General: Abdomen is flat. Bowel sounds are normal.     Palpations: Abdomen is soft.  Musculoskeletal:        General: Normal range of motion.     Cervical back: Normal range of motion and neck supple.  Skin:    General: Skin is warm.     Capillary Refill: Capillary refill takes less than 2 seconds.  Neurological:     General: No focal deficit present.     Mental Status: He is alert and oriented to person, place, and time.  Psychiatric:        Mood and Affect: Mood normal.        Behavior: Behavior normal.    ED Results / Procedures / Treatments   Labs (all labs ordered are listed, but only abnormal results are displayed) Labs Reviewed  CBC WITH DIFFERENTIAL/PLATELET - Abnormal; Notable for the following components:      Result Value   RBC 3.80 (*)    Hemoglobin 10.3 (*)    HCT 31.3 (*)    Lymphs Abs 0.6 (*)    All other components within normal limits  COMPREHENSIVE METABOLIC PANEL - Abnormal; Notable for the following components:   Sodium 129 (*)    CO2 20 (*)    Glucose, Bld 178 (*)    BUN 25 (*)    Albumin 2.7 (*)    AST 47 (*)    ALT 46 (*)    Alkaline Phosphatase 146 (*)    All other components within normal limits  CBG MONITORING, ED - Abnormal; Notable for the following components:   Glucose-Capillary 192 (*)    All other components within normal limits  RESP PANEL BY RT-PCR (FLU A&B, COVID) [id number]  URINALYSIS, ROUTINE W REFLEX MICROSCOPIC    EKG EKG Interpretation  Date/Time:  Friday January 24 2021 13:08:57 EST Ventricular Rate:  114 PR Interval:  154 QRS Duration: 90 QT Interval:  338 QTC Calculation: 465 R Axis:   24 Text Interpretation: Sinus tachycardia Possible Inferior infarct , age undetermined Abnormal ECG Since last tracing rate faster Confirmed by  Isla Pence [id number]) on 01/24/2021 1:13:18 PM  Radiology CT Head Wo Contrast  Result Date: 01/24/2021 CLINICAL DATA:  Acute neurologic deficit.  Fall 1 week ago. EXAM: CT HEAD WITHOUT CONTRAST TECHNIQUE: Contiguous axial images were obtained from the base of the skull through the vertex without intravenous contrast. COMPARISON:  01/18/2021 FINDINGS: Brain: The brainstem, cerebellum, cerebral peduncles, thalami, basal ganglia, basilar cisterns, and ventricular system appear within normal limits. No intracranial hemorrhage, mass lesion, or acute CVA. Vascular: Unremarkable Skull: Unremarkable Sinuses/Orbits: Chronic left ethmoid and right frontal sinusitis. Other: No supplemental non-categorized findings. IMPRESSION: 1. Mild chronic sinusitis.  No acute intracranial findings. Electronically Signed   By: Van Clines M.D.   On: 01/24/2021 15:33    Procedures Procedures   Medications Ordered in ED Medications  allopurinol (ZYLOPRIM) tablet 100 mg (has no administration in time range)  amLODipine (NORVASC) tablet 5 mg (has no administration in time range)  aspirin EC tablet 81 mg (has no administration in time range)  b complex vitamins capsule 1 capsule (has no administration in time range)  Vitamin D3 CAPS 5,000 Units (has no administration in time range)  diclofenac Sodium (VOLTAREN) 1 % topical gel 2 g (has no administration in time range)  lisinopril (ZESTRIL) tablet 5 mg (has no administration in time range)  magnesium oxide (MAG-OX) tablet 400 mg (has no administration  in time range)  meloxicam (MOBIC) tablet 15 mg (has no administration in time range)  metoprolol succinate (TOPROL-XL) 24 hr tablet 25 mg (has no administration in time range)  oxyCODONE (Oxy IR/ROXICODONE) immediate release tablet 5 mg (has no administration in time range)  pantoprazole (PROTONIX) EC tablet 40 mg (has no administration in time range)  rosuvastatin (CRESTOR) tablet 40 mg (has no administration in  time range)  icosapent Ethyl (VASCEPA) 1 g capsule 2 g (has no administration in time range)  tamsulosin (FLOMAX) capsule 0.4 mg (has no administration in time range)  sodium chloride 0.9 % bolus 1,000 mL (1,000 mLs Intravenous New Bag/Given 01/24/21 1416)  morphine 4 MG/ML injection 4 mg (4 mg Intravenous Given 01/24/21 1405)  ondansetron (ZOFRAN) injection 4 mg (4 mg Intravenous Given 01/24/21 1404)    ED Course  I have reviewed the triage vital signs and the nursing notes.  Pertinent labs & imaging results that were available during my care of the patient were reviewed by me and considered in my medical decision making (see chart for details).    MDM Rules/Calculators/A&P                         Pt has ambulatory dysfunction and has not been able to get up for a week.  TOC and PT consults have been requested.  Home meds ordered.      Final Clinical Impression(s) / ED Diagnoses Final diagnoses:  Ambulatory dysfunction    Rx / DC Orders ED Discharge Orders     None        Isla Pence, MD 01/24/21 [phone]

## 2021-01-25 NOTE — Progress Notes (Signed)
Inpatient Rehab Admissions Coordinator:   Per therapy recommendations, patient was screened for CIR candidacy by Clemens Catholic, MS, CCC-SLP. At this time, Pt. Does not appear to demonstrate medical necessity for CIR admission, though work up is ongoing. Admission team will follow and re-screen once work up is complete; however, recommend TOC continue looking for other rehab venues.  Please contact me with any questions.    Clemens Catholic, [place], [place] Admissions Coordinator  [phone] ([place]) [phone] (office)

## 2021-01-25 NOTE — ED Notes (Signed)
Raised the end of the bed per pt's request

## 2021-01-26 NOTE — ED Notes (Signed)
Removed Bunny Boots per pt request

## 2021-01-26 NOTE — ED Notes (Signed)
Pt not able to use call bell. Metal call bell placed at bedside.

## 2021-01-26 NOTE — ED Notes (Signed)
Pt switched over to a hospital bed

## 2021-01-26 NOTE — ED Provider Notes (Signed)
Patient stable awaiting nursing home placement   Phillip Ferguson, MD 01/26/21 [phone]

## 2021-01-26 NOTE — ED Notes (Signed)
Bunny boots applied to pt.'s lower extremities

## 2021-01-27 NOTE — Progress Notes (Signed)
Inpatient Rehab Admissions Coordinator:   Per therapy recommendations, patient was screened for CIR candidacy by Kolsen Choe, MS, CCC-SLP. At this time, Pt. does not appear to demonstrate medical necessity to justify in hospital rehabilitation/CIR. will not pursue a rehab consult for this Pt.   Recommend other rehab venues to be pursued.  Please contact me with any questions.  Anisa Leanos, MS, CCC-SLP Rehab Admissions Coordinator  [phone] (celll) [phone] (office)   

## 2021-01-27 NOTE — Progress Notes (Signed)
CSW spoke with Rachel Moulds from [facility] ,she reported pt's Phillip Pace was approved . Pt can d/c tomorrow to facility.  Arlie Solomons.Latica Hohmann, MSW, [place]   Transitions of Care Clinical Social Worker I Direct Dial: [phone]   Fax: [phone] [email or web address]

## 2021-01-27 NOTE — Progress Notes (Signed)
CSW called and left a VM with Shanna from [facility] requesting a return call.   Arlie Solomons.Darrio Bade, MSW, [place]   Transitions of Care Clinical Social Worker I Direct Dial: [phone]   Fax: [phone] [email or web address]

## 2021-01-27 NOTE — ED Notes (Signed)
[place] still in place

## 2021-01-27 NOTE — ED Notes (Signed)
Patient rounded on, chronic pain in hands and legs addressed. See MAR. No other complaints at this time.

## 2021-01-27 NOTE — Discharge Planning (Signed)
RNCM called patient x2, left voicemail message. RNCM spoke with patient/significant other to advise prior auth from health insurance was approved and patient will be discharged to facility tomorrow. No additional questions from patient.

## 2021-01-27 NOTE — Discharge Planning (Addendum)
RNCM received inbound call from patient. RNCM spoke with patient to confirm prior auth was approved and he will be discharged on tomorrow. Patient confirmed verbal understanding. No additional needs at this time. TOC will continue to follow.

## 2021-01-27 NOTE — ED Notes (Signed)
Helped pt with urinal. Emptied and gave pt new one

## 2021-01-27 NOTE — Progress Notes (Signed)
CSW attempted to submit insurance Auth, however [facility] is closed. Auth can be submitted tomorrow.  Arlie Solomons.Jaeger Trueheart, MSW, [place]   Transitions of Care Clinical Social Worker I Direct Dial: [phone]   Fax: [phone] [email or web address]

## 2021-01-27 NOTE — ED Notes (Signed)
Pt unable to feed himself . I feed pt a boiled egg and a strip & 1/2 of bacon

## 2021-01-27 NOTE — Progress Notes (Signed)
CSW spoke with pt who accepted offer from [facility]. CSW to submit  insurance auth .  Arlie Solomons.Kemia Wendel, MSW, [place]   Transitions of Care Clinical Social Worker I Direct Dial: [phone]   Fax: [phone] [email or web address]

## 2021-01-28 ENCOUNTER — Ambulatory Visit (HOSPITAL_COMMUNITY): Admission: RE | Admit: 2021-01-28 | Payer: Medicare HMO | Source: Ambulatory Visit

## 2021-01-28 NOTE — Progress Notes (Signed)
TOC CSW confirmed with [facility] pt can d/c this morning. Call report to [phone] . Facility will need AVS and MAR faxed to [phone]. MD and RN made aware.   Arlie Solomons.Jessi Jessop, MSW, [place]   Transitions of Care Clinical Social Worker I Direct Dial: [phone]   Fax: [phone] [email or web address]

## 2021-01-28 NOTE — ED Provider Notes (Signed)
Emergency Medicine Observation Re-evaluation Note  Phillip Pace is a 75 y.o. male, seen on rounds today.  Pt initially presented to the ED for complaints of Fall Currently, the patient is resting in bed.  Physical Exam  BP 121/69 (BP Location: Right Arm)    Pulse 90    Temp 98.4 F (36.9 C) (Oral)    Resp 18    SpO2 100%  Physical Exam General: Nontoxic appearance Cardiac: Normal heart Lungs: Normal respiratory Psych: No internal responsiveness  ED Course / MDM  EKG:EKG Interpretation  Date/Time:  Friday January 24 2021 13:08:57 EST Ventricular Rate:  114 PR Interval:  154 QRS Duration: 90 QT Interval:  338 QTC Calculation: 465 R Axis:   24 Text Interpretation: Sinus tachycardia Possible Inferior infarct , age undetermined Abnormal ECG Since last tracing rate faster Confirmed by Isla Pence [id number]) on 01/24/2021 1:13:18 PM  I have reviewed the labs performed to date as well as medications administered while in observation.  Recent changes in the last 24 hours include cooperative with efforts to help him.  Plan  Current plan is for placement in a nursing care facility.  Patient accepted today and will be transferred.  Phillip Pace is not under involuntary commitment.     Daleen Bo, MD 01/28/21 [phone]

## 2021-01-28 NOTE — ED Notes (Signed)
Report given to Leonidas Romberg @ [facility]

## 2021-01-28 NOTE — ED Notes (Signed)
NT at bedside assisting with feeding pt

## 2021-01-28 NOTE — ED Notes (Addendum)
Transport Contacted, Pt contacted family to share that he will be transported to [facility]. Assisted patient to also text the location address to her.

## 2021-01-28 NOTE — Discharge Instructions (Signed)
Continue working with physical therapy, and your medical doctor.  Follow-up for ongoing management within the next week or 2, and as needed.  Take all of your medicines as directed.

## 2021-01-28 NOTE — ED Notes (Addendum)
Assisted patient with meal tray. Ate 25% of meal and 120 ml of water. Urinal assistance output 300 mL.

## 2021-02-19 ENCOUNTER — Emergency Department (HOSPITAL_COMMUNITY): Payer: Medicare Other

## 2021-02-19 ENCOUNTER — Encounter (HOSPITAL_COMMUNITY): Payer: Self-pay

## 2021-02-19 ENCOUNTER — Other Ambulatory Visit: Payer: Self-pay

## 2021-02-19 ENCOUNTER — Inpatient Hospital Stay (HOSPITAL_COMMUNITY)
Admission: EM | Admit: 2021-02-19 | Discharge: 2021-02-21 | DRG: 683 | Disposition: A | Discharge status: Skilled Nursing Facility | Payer: Medicare Other | Source: Ambulatory Visit | Attending: Internal Medicine | Admitting: Internal Medicine

## 2021-02-19 ENCOUNTER — Observation Stay (HOSPITAL_COMMUNITY): Payer: Medicare Other

## 2021-02-19 DIAGNOSIS — I2510 Atherosclerotic heart disease of native coronary artery without angina pectoris: Secondary | ICD-10-CM | POA: Diagnosis present

## 2021-02-19 DIAGNOSIS — E785 Hyperlipidemia, unspecified: Secondary | ICD-10-CM | POA: Diagnosis not present

## 2021-02-19 DIAGNOSIS — Z8249 Family history of ischemic heart disease and other diseases of the circulatory system: Secondary | ICD-10-CM

## 2021-02-19 DIAGNOSIS — R7303 Prediabetes: Secondary | ICD-10-CM | POA: Diagnosis not present

## 2021-02-19 DIAGNOSIS — M1990 Unspecified osteoarthritis, unspecified site: Secondary | ICD-10-CM | POA: Diagnosis not present

## 2021-02-19 DIAGNOSIS — M2550 Pain in unspecified joint: Secondary | ICD-10-CM | POA: Diagnosis present

## 2021-02-19 DIAGNOSIS — Z91013 Allergy to seafood: Secondary | ICD-10-CM

## 2021-02-19 DIAGNOSIS — I959 Hypotension, unspecified: Secondary | ICD-10-CM | POA: Diagnosis not present

## 2021-02-19 DIAGNOSIS — K2270 Barrett's esophagus without dysplasia: Secondary | ICD-10-CM | POA: Diagnosis present

## 2021-02-19 DIAGNOSIS — E860 Dehydration: Secondary | ICD-10-CM | POA: Diagnosis present

## 2021-02-19 DIAGNOSIS — Z20822 Contact with and (suspected) exposure to covid-19: Secondary | ICD-10-CM | POA: Diagnosis present

## 2021-02-19 DIAGNOSIS — Z6821 Body mass index (BMI) 21.0-21.9, adult: Secondary | ICD-10-CM

## 2021-02-19 DIAGNOSIS — I130 Hypertensive heart and chronic kidney disease with heart failure and stage 1 through stage 4 chronic kidney disease, or unspecified chronic kidney disease: Secondary | ICD-10-CM | POA: Diagnosis not present

## 2021-02-19 DIAGNOSIS — I509 Heart failure, unspecified: Secondary | ICD-10-CM | POA: Diagnosis not present

## 2021-02-19 DIAGNOSIS — Z87891 Personal history of nicotine dependence: Secondary | ICD-10-CM

## 2021-02-19 DIAGNOSIS — K219 Gastro-esophageal reflux disease without esophagitis: Secondary | ICD-10-CM | POA: Diagnosis not present

## 2021-02-19 DIAGNOSIS — Z7982 Long term (current) use of aspirin: Secondary | ICD-10-CM

## 2021-02-19 DIAGNOSIS — K22719 Barrett's esophagus with dysplasia, unspecified: Secondary | ICD-10-CM

## 2021-02-19 DIAGNOSIS — R5381 Other malaise: Secondary | ICD-10-CM

## 2021-02-19 DIAGNOSIS — R627 Adult failure to thrive: Secondary | ICD-10-CM | POA: Diagnosis present

## 2021-02-19 DIAGNOSIS — M25562 Pain in left knee: Secondary | ICD-10-CM

## 2021-02-19 DIAGNOSIS — N1831 Chronic kidney disease, stage 3a: Secondary | ICD-10-CM | POA: Diagnosis present

## 2021-02-19 DIAGNOSIS — R54 Age-related physical debility: Secondary | ICD-10-CM | POA: Diagnosis present

## 2021-02-19 DIAGNOSIS — M109 Gout, unspecified: Secondary | ICD-10-CM | POA: Diagnosis present

## 2021-02-19 DIAGNOSIS — N1832 Chronic kidney disease, stage 3b: Secondary | ICD-10-CM | POA: Diagnosis not present

## 2021-02-19 DIAGNOSIS — Z79899 Other long term (current) drug therapy: Secondary | ICD-10-CM | POA: Diagnosis not present

## 2021-02-19 DIAGNOSIS — Z880 Allergy status to penicillin: Secondary | ICD-10-CM

## 2021-02-19 DIAGNOSIS — E440 Moderate protein-calorie malnutrition: Secondary | ICD-10-CM | POA: Diagnosis not present

## 2021-02-19 DIAGNOSIS — N179 Acute kidney failure, unspecified: Principal | ICD-10-CM | POA: Diagnosis present

## 2021-02-19 DIAGNOSIS — R609 Edema, unspecified: Secondary | ICD-10-CM

## 2021-02-19 DIAGNOSIS — R6251 Failure to thrive (child): Secondary | ICD-10-CM

## 2021-02-19 LAB — CBC WITH DIFFERENTIAL/PLATELET
Abs Immature Granulocytes: 0.07 10*3/uL (ref 0.00–0.07)
Basophils Absolute: 0 10*3/uL (ref 0.0–0.1)
Basophils Relative: 0 %
Eosinophils Absolute: 0 10*3/uL (ref 0.0–0.5)
Eosinophils Relative: 0 %
HCT: 28.7 % — ABNORMAL LOW (ref 39.0–52.0)
Hemoglobin: 9.1 g/dL — ABNORMAL LOW (ref 13.0–17.0)
Immature Granulocytes: 1 %
Lymphocytes Relative: 4 %
Lymphs Abs: 0.4 10*3/uL — ABNORMAL LOW (ref 0.7–4.0)
MCH: 26.8 pg (ref 26.0–34.0)
MCHC: 31.7 g/dL (ref 30.0–36.0)
MCV: 84.7 fL (ref 80.0–100.0)
Monocytes Absolute: 0.5 10*3/uL (ref 0.1–1.0)
Monocytes Relative: 5 %
Neutro Abs: 8.3 10*3/uL — ABNORMAL HIGH (ref 1.7–7.7)
Neutrophils Relative %: 90 %
Platelets: 169 10*3/uL (ref 150–400)
RBC: 3.39 MIL/uL — ABNORMAL LOW (ref 4.22–5.81)
RDW: 16.9 % — ABNORMAL HIGH (ref 11.5–15.5)
WBC: 9.2 10*3/uL (ref 4.0–10.5)
nRBC: 0 % (ref 0.0–0.2)

## 2021-02-19 LAB — HEMOGLOBIN A1C
Hgb A1c MFr Bld: 6.2 % — ABNORMAL HIGH (ref 4.8–5.6)
Mean Plasma Glucose: 131.24 mg/dL

## 2021-02-19 LAB — COMPREHENSIVE METABOLIC PANEL
ALT: 27 U/L (ref 0–44)
AST: 24 U/L (ref 15–41)
Albumin: 2.6 g/dL — ABNORMAL LOW (ref 3.5–5.0)
Alkaline Phosphatase: 139 U/L — ABNORMAL HIGH (ref 38–126)
Anion gap: 12 (ref 5–15)
BUN: 25 mg/dL — ABNORMAL HIGH (ref 8–23)
CO2: 21 mmol/L — ABNORMAL LOW (ref 22–32)
Calcium: 8.5 mg/dL — ABNORMAL LOW (ref 8.9–10.3)
Chloride: 98 mmol/L (ref 98–111)
Creatinine, Ser: 1.78 mg/dL — ABNORMAL HIGH (ref 0.61–1.24)
GFR, Estimated: 40 mL/min — ABNORMAL LOW (ref >60)
Glucose, Bld: 187 mg/dL — ABNORMAL HIGH (ref 70–99)
Potassium: 3.8 mmol/L (ref 3.5–5.1)
Sodium: 131 mmol/L — ABNORMAL LOW (ref 135–145)
Total Bilirubin: 0.8 mg/dL (ref 0.3–1.2)
Total Protein: 6.9 g/dL (ref 6.5–8.1)

## 2021-02-19 LAB — C-REACTIVE PROTEIN: CRP: 26.9 mg/dL — ABNORMAL HIGH (ref <1.0)

## 2021-02-19 LAB — CK TOTAL AND CKMB (NOT AT ARMC)
CK, MB: 0.7 ng/mL (ref 0.5–5.0)
Relative Index: INVALID (ref 0.0–2.5)
Total CK: 13 U/L — ABNORMAL LOW (ref 49–397)

## 2021-02-19 LAB — RESP PANEL BY RT-PCR (FLU A&B, COVID) ARPGX2
Influenza A by PCR: NEGATIVE
Influenza B by PCR: NEGATIVE
SARS Coronavirus 2 by RT PCR: NEGATIVE

## 2021-02-19 LAB — T4, FREE: Free T4: 1.19 ng/dL — ABNORMAL HIGH (ref 0.61–1.12)

## 2021-02-19 LAB — TSH: TSH: 3.789 u[IU]/mL (ref 0.350–4.500)

## 2021-02-19 LAB — URIC ACID: Uric Acid, Serum: 5.4 mg/dL (ref 3.7–8.6)

## 2021-02-19 LAB — SEDIMENTATION RATE: Sed Rate: 135 mm/hr — ABNORMAL HIGH (ref 0–16)

## 2021-02-19 LAB — GLUCOSE, CAPILLARY: Glucose-Capillary: 163 mg/dL — ABNORMAL HIGH (ref 70–99)

## 2021-02-19 LAB — LACTIC ACID, PLASMA: Lactic Acid, Venous: 1.9 mmol/L (ref 0.5–1.9)

## 2021-02-19 LAB — CBG MONITORING, ED: Glucose-Capillary: 147 mg/dL — ABNORMAL HIGH (ref 70–99)

## 2021-02-19 LAB — CORTISOL: Cortisol, Plasma: 29.3 ug/dL

## 2021-02-19 MED ORDER — ACETAMINOPHEN 650 MG RE SUPP: 650.0000 mg | Freq: Four times a day (QID) | RECTAL | Status: DC | PRN

## 2021-02-19 MED ORDER — B COMPLEX VITAMINS PO CAPS: 1.0000 | ORAL_CAPSULE | Freq: Every day | ORAL | Status: DC

## 2021-02-19 MED ORDER — INSULIN ASPART 100 UNIT/ML IJ SOLN: 0.0000 [IU] | Freq: Every day | INTRAMUSCULAR | Status: DC

## 2021-02-19 MED ORDER — ACETAMINOPHEN 325 MG PO TABS: 650.0000 mg | ORAL_TABLET | Freq: Four times a day (QID) | ORAL | Status: DC | PRN

## 2021-02-19 MED ORDER — INSULIN ASPART 100 UNIT/ML IJ SOLN
0.0000 [IU] | Freq: Three times a day (TID) | INTRAMUSCULAR | Status: DC
  Administered 2021-02-19: 1 [IU] via SUBCUTANEOUS
  Administered 2021-02-20: 2 [IU] via SUBCUTANEOUS
  Administered 2021-02-20: 3 [IU] via SUBCUTANEOUS
  Administered 2021-02-20 – 2021-02-21 (×2): 2 [IU] via SUBCUTANEOUS
  Administered 2021-02-21: 1 [IU] via SUBCUTANEOUS
  Filled 2021-02-19: qty 1

## 2021-02-19 MED ORDER — METHYLPREDNISOLONE SODIUM SUCC 125 MG IJ SOLR
80.0000 mg | Freq: Two times a day (BID) | INTRAMUSCULAR | Status: DC
  Administered 2021-02-19 – 2021-02-21 (×4): 80 mg via INTRAVENOUS
  Filled 2021-02-19 (×4): qty 2

## 2021-02-19 MED ORDER — ALLOPURINOL 100 MG PO TABS
100.0000 mg | ORAL_TABLET | Freq: Every day | ORAL | Status: DC
  Administered 2021-02-20 – 2021-02-21 (×2): 100 mg via ORAL
  Filled 2021-02-19 (×2): qty 1

## 2021-02-19 MED ORDER — ASPIRIN EC 81 MG PO TBEC
81.0000 mg | DELAYED_RELEASE_TABLET | Freq: Every day | ORAL | Status: DC
Start: 2021-02-19 — End: 2021-02-21
  Administered 2021-02-19 – 2021-02-21 (×3): 81 mg via ORAL
  Filled 2021-02-19 (×3): qty 1

## 2021-02-19 MED ORDER — OXYCODONE HCL 5 MG PO TABS
5.0000 mg | ORAL_TABLET | ORAL | Status: DC | PRN
  Administered 2021-02-20 – 2021-02-21 (×2): 5 mg via ORAL
  Filled 2021-02-19 (×2): qty 1

## 2021-02-19 MED ORDER — B COMPLEX-C PO TABS
1.0000 | ORAL_TABLET | Freq: Every day | ORAL | Status: DC
  Administered 2021-02-19 – 2021-02-21 (×3): 1 via ORAL
  Filled 2021-02-19 (×3): qty 1

## 2021-02-19 MED ORDER — OXYCODONE-ACETAMINOPHEN 5-325 MG PO TABS
1.0000 | ORAL_TABLET | Freq: Once | ORAL | Status: AC
  Administered 2021-02-19: 1 via ORAL
  Filled 2021-02-19: qty 1

## 2021-02-19 MED ORDER — POTASSIUM CHLORIDE IN NACL 20-0.9 MEQ/L-% IV SOLN
INTRAVENOUS | Status: DC
  Filled 2021-02-19: qty 1000

## 2021-02-19 MED ORDER — HEPARIN SODIUM (PORCINE) 5000 UNIT/ML IJ SOLN
5000.0000 [IU] | Freq: Three times a day (TID) | INTRAMUSCULAR | Status: DC
  Administered 2021-02-20 – 2021-02-21 (×2): 5000 [IU] via SUBCUTANEOUS
  Filled 2021-02-19 (×2): qty 1

## 2021-02-19 MED ORDER — SODIUM CHLORIDE 0.9 % IV BOLUS
500.0000 mL | Freq: Once | INTRAVENOUS | Status: AC
  Administered 2021-02-19: 500 mL via INTRAVENOUS

## 2021-02-19 MED ORDER — PANTOPRAZOLE SODIUM 40 MG PO TBEC
40.0000 mg | DELAYED_RELEASE_TABLET | Freq: Every day | ORAL | Status: DC
  Administered 2021-02-20 – 2021-02-21 (×2): 40 mg via ORAL
  Filled 2021-02-19 (×2): qty 1

## 2021-02-19 NOTE — ED Provider Notes (Signed)
[facility] Provider Note   CSN: [id number] Arrival date & time: 02/19/21  1325     History  Chief Complaint  Patient presents with   Hypotension   Weakness    Mase Dhondt is a 76 y.o. male.  Patient has a history of congestive heart failure coronary artery disease and COPD.  Patient complains of not eating or drinking much of anything for the last 3 to 4 days he has significant pain in both knees and both wrists.  Patient also has a history of gout.  He was seen at an office today and he had a blood pressure in the 80  The history is provided by the patient and medical records.  Weakness Severity:  Moderate Onset quality:  Sudden Timing:  Constant Progression:  Worsening Chronicity:  Recurrent Context: not alcohol use   Relieved by:  Nothing Worsened by:  Nothing Ineffective treatments:  None tried Associated symptoms: no abdominal pain, no chest pain, no cough, no diarrhea, no frequency, no headaches and no seizures       Home Medications Prior to Admission medications   Medication Sig Start Date End Date Taking? Authorizing Provider  allopurinol (ZYLOPRIM) 100 MG tablet Take 100 mg by mouth daily.    [provider]  amLODipine (NORVASC) 5 MG tablet Take 1 tablet (5 mg total) by mouth daily. 11/06/20   Sueanne Margarita, MD  aspirin EC 81 MG tablet Take 1 tablet (81 mg total) by mouth daily. Swallow whole. 11/06/20   Sueanne Margarita, MD  b complex vitamins capsule Take 1 capsule by mouth daily.    [provider]  Cholecalciferol (VITAMIN D3) 5000 units CAPS Take 5,000 Units by mouth daily.    [provider]  diclofenac Sodium (VOLTAREN) 1 % GEL Apply 2 g topically 4 (four) times daily.    [provider]  HYDROcodone-acetaminophen (NORCO/VICODIN) 5-325 MG tablet Take 1 tablet by mouth every 12 (twelve) hours as needed for moderate pain.    [provider]  icosapent Ethyl (VASCEPA) 1 g capsule Take 2  capsules (2 g total) by mouth 2 (two) times daily. 11/13/20   Sueanne Margarita, MD  lisinopril (ZESTRIL) 5 MG tablet Take 1 tablet (5 mg total) by mouth every evening. 11/06/20   Sueanne Margarita, MD  magnesium oxide (MAG-OX) 400 MG tablet Take 400 mg by mouth daily.    [provider]  methocarbamol (ROBAXIN) 750 MG tablet Take 750 mg by mouth 4 (four) times daily.    [provider]  metoprolol succinate (TOPROL-XL) 25 MG 24 hr tablet Take 1 tablet (25 mg total) by mouth daily. 11/06/20   Turner, Eber Hong, MD  NITROSTAT 0.4 MG SL tablet Place 0.4 mg under the tongue every 5 (five) minutes as needed. 07/06/14   [provider]  ondansetron (ZOFRAN-ODT) 8 MG disintegrating tablet Take 8 mg by mouth 2 (two) times daily as needed for nausea or vomiting.    [provider]  oxyCODONE (ROXICODONE) 5 MG immediate release tablet Take 1 tablet (5 mg total) by mouth every 4 (four) hours as needed for severe pain. 12/09/20   Tacy Learn, PA-C  pantoprazole (PROTONIX) 40 MG tablet TAKE 1 TABLET(40 MG) BY MOUTH TWICE DAILY BEFORE A MEAL Patient taking differently: Take 40 mg by mouth daily. 09/19/18   Mahala Menghini, PA-C  predniSONE (STERAPRED UNI-PAK 21 TAB) 10 MG (21) TBPK tablet Take by mouth daily. Take 6 tabs by  mouth daily  for 2 days, then 5 tabs for 2 days, then 4 tabs for 2 days, then 3 tabs for 2 days, 2 tabs for 2 days, then 1 tab by mouth daily for 2 days Patient not taking: Reported on 01/04/2021 12/09/20   Suella Broad A, PA-C  rosuvastatin (CRESTOR) 40 MG tablet Take 1 tablet (40 mg total) by mouth daily. 11/13/20 02/11/21  Sueanne Margarita, MD  tamsulosin (FLOMAX) 0.4 MG CAPS capsule Take 0.4 mg by mouth daily.  11/25/15   [provider]      Allergies    Penicillins and Shellfish allergy    Review of Systems   Review of Systems  Constitutional:  Negative for appetite change and fatigue.  HENT:  Negative for congestion, ear discharge and sinus  pressure.   Eyes:  Negative for discharge.  Respiratory:  Negative for cough.   Cardiovascular:  Negative for chest pain.  Gastrointestinal:  Negative for abdominal pain and diarrhea.  Genitourinary:  Negative for frequency and hematuria.  Musculoskeletal:  Negative for back pain.       Joint pain  Skin:  Negative for rash.  Neurological:  Positive for weakness. Negative for seizures and headaches.  Psychiatric/Behavioral:  Negative for hallucinations.    Physical Exam Updated Vital Signs BP 103/61    Pulse (!) 103    Temp 98 F (36.7 C) (Oral)    Resp 18    SpO2 95%  Physical Exam Vitals and nursing note reviewed.  Constitutional:      Appearance: He is well-developed.  HENT:     Head: Normocephalic.  Eyes:     General: No scleral icterus.    Conjunctiva/sclera: Conjunctivae normal.  Neck:     Thyroid: No thyromegaly.  Cardiovascular:     Rate and Rhythm: Regular rhythm. Tachycardia present.     Heart sounds: No murmur heard.   No friction rub. No gallop.  Pulmonary:     Breath sounds: No stridor. No wheezing or rales.  Chest:     Chest wall: No tenderness.  Abdominal:     General: There is no distension.     Tenderness: There is no abdominal tenderness. There is no rebound.  Musculoskeletal:     Cervical back: Neck supple.     Comments: Severe tenderness to both wrists and both knees  Lymphadenopathy:     Cervical: No cervical adenopathy.  Skin:    Findings: No erythema or rash.  Neurological:     Mental Status: He is alert and oriented to person, place, and time.     Motor: No abnormal muscle tone.     Coordination: Coordination normal.  Psychiatric:        Behavior: Behavior normal.    ED Results / Procedures / Treatments   Labs (all labs ordered are listed, but only abnormal results are displayed) Labs Reviewed  CBC WITH DIFFERENTIAL/PLATELET - Abnormal; Notable for the following components:      Result Value   RBC 3.39 (*)    Hemoglobin 9.1 (*)     HCT 28.7 (*)    RDW 16.9 (*)    Neutro Abs 8.3 (*)    Lymphs Abs 0.4 (*)    All other components within normal limits  COMPREHENSIVE METABOLIC PANEL - Abnormal; Notable for the following components:   Sodium 131 (*)    CO2 21 (*)    Glucose, Bld 187 (*)    BUN 25 (*)    Creatinine, Ser 1.78 (*)  Calcium 8.5 (*)    Albumin 2.6 (*)    Alkaline Phosphatase 139 (*)    GFR, Estimated 40 (*)    All other components within normal limits  LACTIC ACID, PLASMA  URIC ACID    EKG None  Radiology DG Chest Port 1 View  Result Date: 02/19/2021 CLINICAL DATA:  Short of breath and weakness EXAM: PORTABLE CHEST 1 VIEW COMPARISON:  01/24/2021 FINDINGS: The heart size and mediastinal contours are within normal limits. Both lungs are clear. The visualized skeletal structures are unremarkable. IMPRESSION: No active disease. Electronically Signed   By: Franchot Gallo M.D.   On: 02/19/2021 14:57    Procedures Procedures    Medications Ordered in ED Medications  sodium chloride 0.9 % bolus 500 mL (500 mLs Intravenous New Bag/Given 02/19/21 1445)  oxyCODONE-acetaminophen (PERCOCET/ROXICET) 5-325 MG per tablet 1 tablet (1 tablet Oral Given 02/19/21 1446)    ED Course/ Medical Decision Making/ A&P                           Medical Decision Making Amount and/or Complexity of Data Reviewed Labs: ordered. Radiology: ordered. ECG/medicine tests: ordered.  Risk Prescription drug management. Decision regarding hospitalization.  Patient with an AKI and arthralgias that could be related to gout.  He will be admitted for hydration and treatment of his arthralgias    This patient presents to the ED for concern of weakness and joint pain, this involves an extensive number of treatment options, and is a complaint that carries with it a high risk of complications and morbidity.  The differential diagnosis includes dehydration, cancer, gout   Co morbidities that complicate the patient  evaluation  Gout, coronary artery disease, COPD   Additional history obtained:  Additional history obtained from office record External records from outside source obtained and reviewed including hospital record   Lab Tests:  I Ordered, and personally interpreted labs.  The pertinent results include: March 05, 1932   Imaging Studies ordered:  I ordered imaging studies including CT abdomen I independently visualized and interpreted imaging which showed no acute disease I agree with the radiologist interpretation   Cardiac Monitoring:  The patient was maintained on a cardiac monitor.  I personally viewed and interpreted the cardiac monitored which showed an underlying rhythm of: Normal sinus rhythm   Medicines ordered and prescription drug management:  I ordered medication including normal saline and Percocet Reevaluation of the patient after these medicines showed that the patient improved I have reviewed the patients home medicines and have made adjustments as needed   Test Considered:  CT chest   Critical Interventions:  None   Consultations Obtained:  I requested consultation with the hospital,  and discussed lab and imaging findings as well as pertinent plan - they recommend: Admission   Problem List / ED Course:  Dehydration weakness myalgias   Reevaluation:  After the interventions noted above, I reevaluated the patient and found that they have :stayed the same   Social Determinants of Health:  None   Dispostion:  After consideration of the diagnostic results and the patients response to treatment, I feel that the patent would benefit from admission for dehydration and myalgias for continued work-up.          Final Clinical Impression(s) / ED Diagnoses Final diagnoses:  None    Rx / DC Orders ED Discharge Orders     None         Yeilyn Gent,  Broadus John, MD 02/20/21 1134

## 2021-02-19 NOTE — ED Triage Notes (Signed)
Patient brought in via ems from MD office. Patient has lost 20lbs in the last month, is unable to walk and was feeling weak with hypotension this morning at 80/60. 1l fluid given,

## 2021-02-19 NOTE — H&P (Signed)
TRH H&P   Patient Demographics:    Phillip Pace, is a 76 y.o. male  MRN: [id number]   DOB - 05-Dec-1945  Admit Date - 02/19/2021  Outpatient Primary MD for the patient is Celene Squibb, MD  Referring MD/NP/PA: Dr Roderic Palau  Patient coming from: home/PCP office  Chief Complaint  Patient presents with   Hypotension   Weakness      HPI:    Phillip Pace  is a 76 y.o. male, past medical history of CAD, COPD, hyperlipidemia, hypertension, resents to ED from PCP office for hypotension and generalized body ache, patient reports weight loss around 20 pounds for last few weeks, and loss of appetite, generalized weakness and increased frailty, no oral intake for last 4 days, he denies fever, chills, cough, nausea or vomiting, no diarrhea or constipation, but he does endorse generalized body ache, mainly joint, both knees, wrists and elbows, reported history of gout, but reports current joint pain does not resemble previous gout episodes, patient went to PCP, where he was found to be hypotensive, here received 1 L fluid bolus and instructed to come to ED. - in ED patient was hypotensive on presentation, he did respond to fluid bolus, work-up significant for sodium of 131, creatinine of 1.78, increased from baseline of 1.09, as well his glucose elevated at 187, CT abdomen pelvis with no acute findings to explain weight loss, CT lumbar spine significant for severe radiculopathy, [facility] consulted to admit.    Review of systems:    In addition to the HPI above,  No Fever-chills, generalized weakness, fatigue and weight loss No Headache, No changes with Vision or hearing, No problems swallowing food or Liquids, No Chest pain, Cough or Shortness of Breath, No Abdominal pain, No Nausea or Vommitting, Bowel movements are regular, No Blood in stool or Urine, No dysuria, No new skin  rashes or bruises, Reports generalized joint pain  reported generalized weakness, fatigue, but no focal deficits  He reports 20 pound weight loss No polyuria, polydypsia or polyphagia, No significant Mental Stressors.  A full 10 point Review of Systems was done, except as stated above, all other Review of Systems were negative.   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   Dilated aortic root ([place])    42 mm by 2D echo 12/2020   Hypertension       Past Surgical History:  Procedure Laterality Date   COLONOSCOPY N/A 05/25/2017   Procedure: COLONOSCOPY;  Surgeon: Daneil Dolin, MD;  Location: [facility];  Service: Endoscopy;  Laterality: N/A;  12:00pm   ESOPHAGOGASTRODUODENOSCOPY N/A 05/25/2017   Procedure: ESOPHAGOGASTRODUODENOSCOPY (EGD);  Surgeon: Daneil Dolin, MD;  Location: [facility];  Service: Endoscopy;  Laterality: N/A;   FRACTURE SURGERY     collar bone   HERNIA REPAIR     x 2   MALONEY DILATION N/A 05/25/2017   Procedure: MALONEY DILATION;  Surgeon: Daneil Dolin, MD;  Location: [facility];  Service: Endoscopy;  Laterality: N/A;   stents 13        Social History:     Social History   Tobacco Use   Smoking status: Former    Types: Cigarettes    Quit date: 02/02/1993    Years since quitting: 28.0   Smokeless tobacco: Never  Substance Use Topics   Alcohol use: No    Alcohol/week: 0.0 standard drinks        Family History :     Family History  Problem Relation Age of Onset   Cancer - Colon Mother    CAD Mother    Colon cancer Father    Gastric cancer Neg Hx    Esophageal cancer Neg Hx       Home Medications:   Prior to Admission medications   Medication Sig Start Date End Date Taking? Authorizing Provider  allopurinol  (ZYLOPRIM) 100 MG tablet Take 100 mg by mouth daily.   Yes [provider]  amLODipine (NORVASC) 5 MG tablet Take 1 tablet (5 mg total) by mouth daily. 11/06/20  Yes Turner, Eber Hong, MD  b complex vitamins capsule Take 1 capsule by mouth daily.   Yes [provider]  Cholecalciferol (VITAMIN D3) 5000 units CAPS Take 5,000 Units by mouth daily.   Yes [provider]  diclofenac Sodium (VOLTAREN) 1 % GEL Apply 2 g topically 4 (four) times daily.   Yes [provider]  HYDROcodone-acetaminophen (NORCO/VICODIN) 5-325 MG tablet Take 1 tablet by mouth every 12 (twelve) hours as needed for moderate pain.   Yes [provider]  icosapent Ethyl (VASCEPA) 1 g capsule Take 2 capsules (2 g total) by mouth 2 (two) times daily. 11/13/20  Yes Turner, Eber Hong, MD  lisinopril (ZESTRIL) 5 MG tablet Take 1 tablet (5 mg total) by mouth every evening. 11/06/20  Yes Turner, Eber Hong, MD  magnesium oxide (MAG-OX) 400 MG tablet Take 400 mg by mouth daily.   Yes [provider]  methocarbamol (ROBAXIN) 750 MG tablet Take 750 mg by mouth 4 (four) times daily.   Yes [provider]  metoprolol succinate (TOPROL-XL) 25 MG 24 hr tablet Take 1 tablet (25 mg total) by mouth daily. 11/06/20  Yes Turner, Eber Hong, MD  NITROSTAT 0.4 MG SL tablet Place 0.4 mg under the tongue every 5 (five) minutes as needed. 07/06/14  Yes [provider]  Omega-3 Fatty Acids (FISH OIL) 1000 MG CAPS omega-3 acid ethyl esters 1 gram capsule  TAKE 2 CAPSULES BY MOUTH TWICE DAILY   Yes [provider]  ondansetron (ZOFRAN-ODT) 8 MG disintegrating tablet Take 8 mg by mouth 2 (two) times daily as needed for nausea or vomiting.   Yes [provider]  oxyCODONE (ROXICODONE) 5 MG immediate release tablet Take 1 tablet (5 mg total) by mouth every 4 (four) hours as needed for severe pain. 12/09/20  Yes Tacy Learn, PA-C  pantoprazole (PROTONIX) 40 MG tablet TAKE 1 TABLET(40  MG) BY MOUTH TWICE DAILY BEFORE A MEAL Patient taking differently: Take 40 mg by mouth daily. 09/19/18  Yes Mahala Menghini, PA-C  rosuvastatin (CRESTOR) 40 MG  tablet Take 1 tablet (40 mg total) by mouth daily. 11/13/20 02/19/21 Yes Turner, Eber Hong, MD  tamsulosin (FLOMAX) 0.4 MG CAPS capsule Take 0.4 mg by mouth daily.  11/25/15  Yes [provider]  aspirin EC 81 MG tablet Take 1 tablet (81 mg total) by mouth daily. Swallow whole. 11/06/20   Sueanne Margarita, MD  predniSONE (STERAPRED UNI-PAK 21 TAB) 10 MG (21) TBPK tablet Take by mouth daily. Take 6 tabs by mouth daily  for 2 days, then 5 tabs for 2 days, then 4 tabs for 2 days, then 3 tabs for 2 days, 2 tabs for 2 days, then 1 tab by mouth daily for 2 days Patient not taking: Reported on 01/04/2021 12/09/20   Tacy Learn, PA-C     Allergies:     Allergies  Allergen Reactions   Penicillins Shortness Of Breath, Itching, Swelling and Other (See Comments)    Has patient had a PCN reaction causing immediate rash, facial/tongue/throat swelling, SOB or lightheadedness with hypotension: Yes Has patient had a PCN reaction causing severe rash involving mucus membranes or skin necrosis: No Has patient had a PCN reaction that required hospitalization: No Has patient had a PCN reaction occurring within the last 10 years: No If all of the above answers are "NO", then may proceed with Cephalosporin use.    Shellfish Allergy Itching and Swelling     Physical Exam:   Vitals  Blood pressure 103/61, pulse (!) 103, temperature 98 F (36.7 C), temperature source Oral, resp. rate 18, SpO2 95 %.   1. General frail elderly male, laying in bed in no apparent distress  2. Normal affect and insight, Not Suicidal or Homicidal, Awake Alert, Oriented X 3.  3. No F.N deficits, ALL C.Nerves Intact, Strength 5/5 all 4 extremities, Sensation intact all 4 extremities, Plantars down going.  4. Ears and Eyes appear Normal, Conjunctivae clear, PERRLA.  Moist Oral Mucosa.  5. Supple Neck, No JVD, No cervical lymphadenopathy appriciated, No Carotid Bruits.  6. Symmetrical Chest wall movement, Good air movement bilaterally, CTAB.  7. RRR, No Gallops, Rubs or Murmurs, No Parasternal Heave.  8. Positive Bowel Sounds, Abdomen Soft, No tenderness, No organomegaly appriciated,No rebound -guarding or rigidity.  9.  No Cyanosis, delayed skin Turgor, No Skin Rash or Bruise.  10. Good muscle tone,  joints appear normal , only mild left joint effusion, range of motion limited to pain. .  11. No Palpable Lymph Nodes in Neck or Axillae    Data Review:    CBC Recent Labs  Lab 02/19/21 1439  WBC 9.2  HGB 9.1*  HCT 28.7*  PLT 169  MCV 84.7  MCH 26.8  MCHC 31.7  RDW 16.9*  LYMPHSABS 0.4*  MONOABS 0.5  EOSABS 0.0  BASOSABS 0.0   ------------------------------------------------------------------------------------------------------------------  Chemistries  Recent Labs  Lab 02/19/21 1439  NA 131*  K 3.8  CL 98  CO2 21*  GLUCOSE 187*  BUN 25*  CREATININE 1.78*  CALCIUM 8.5*  AST 24  ALT 27  ALKPHOS 139*  BILITOT 0.8   ------------------------------------------------------------------------------------------------------------------ CrCl cannot be calculated (Unknown ideal weight.). ------------------------------------------------------------------------------------------------------------------ No results for input(s): TSH, T4TOTAL, T3FREE, THYROIDAB in the last 72 hours.  Invalid input(s): FREET3  Coagulation profile No results for input(s): INR, PROTIME in the last 168 hours. ------------------------------------------------------------------------------------------------------------------- No results for input(s): DDIMER in the last 72 hours. -------------------------------------------------------------------------------------------------------------------  Cardiac Enzymes No results for input(s): CKMB, TROPONINI,  MYOGLOBIN in the last 168 hours.  Invalid input(s): CK ------------------------------------------------------------------------------------------------------------------  No results found for: BNP   ---------------------------------------------------------------------------------------------------------------  Urinalysis    Component Value Date/Time   COLORURINE YELLOW 01/24/2021 [place] 01/24/2021 1744   LABSPEC 1.015 01/24/2021 1744   PHURINE 5.5 01/24/2021 1744   GLUCOSEU NEGATIVE 01/24/2021 1744   HGBUR NEGATIVE 01/24/2021 1744   BILIRUBINUR NEGATIVE 01/24/2021 1744   KETONESUR NEGATIVE 01/24/2021 1744   PROTEINUR NEGATIVE 01/24/2021 1744   UROBILINOGEN 0.2 07/20/2014 2126   NITRITE NEGATIVE 01/24/2021 1744   LEUKOCYTESUR NEGATIVE 01/24/2021 1744    ----------------------------------------------------------------------------------------------------------------   Imaging Results:    CT ABDOMEN PELVIS WO CONTRAST  Result Date: 02/19/2021 CLINICAL DATA:  Weakness, lower back pain, hypotension, weight loss EXAM: CT ABDOMEN AND PELVIS WITHOUT CONTRAST TECHNIQUE: Multidetector CT imaging of the abdomen and pelvis was performed following the standard protocol without IV contrast. RADIATION DOSE REDUCTION: This exam was performed according to the departmental dose-optimization program which includes automated exposure control, adjustment of the mA and/or kV according to patient size and/or use of iterative reconstruction technique. COMPARISON:  None. FINDINGS: Lower chest: There is subsegmental atelectasis in the lung bases. Coronary artery calcifications are noted. There is trace pericardial fluid. Hepatobiliary: There is a small calcified granuloma in the left hepatic lobe. The liver is otherwise unremarkable, within the confines of noncontrast technique. The gallbladder is unremarkable. There is no biliary ductal dilatation. Pancreas: Unremarkable. Spleen: Multiple  calcified granulomas are seen in the spleen. The spleen is otherwise unremarkable. Adrenals/Urinary Tract: The adrenals are unremarkable. The kidneys are unremarkable, with no focal lesion, stone, hydronephrosis, or hydroureter. The bladder is unremarkable. Stomach/Bowel: The stomach is unremarkable there is no evidence of bowel obstruction. There is no abnormal bowel wall thickening or inflammatory change. The appendix is normal. Vascular/Lymphatic: There is scattered calcified atherosclerotic plaque throughout the nonaneurysmal abdominal aorta. There is no abdominopelvic lymphadenopathy. Reproductive: The prostate and seminal vesicles are unremarkable. Other: There is no ascites or free air. Musculoskeletal: There is no acute osseous abnormality or aggressive osseous lesion. The lumbar spine is assessed in full on the separately dictated CT lumbar spine. IMPRESSION: 1. No acute findings in the abdomen or pelvis. No finding to explain the patient's presentation or weight loss. 2. Coronary artery calcifications and trace pericardial fluid. Electronically Signed   By: Valetta Mole M.D.   On: 02/19/2021 15:26   CT L-SPINE NO CHARGE  Result Date: 02/19/2021 CLINICAL DATA:  Low back pain, weight loss weakness, hypotension EXAM: CT LUMBAR SPINE WITHOUT CONTRAST TECHNIQUE: Multidetector CT imaging of the lumbar spine was performed without intravenous contrast administration. Multiplanar CT image reconstructions were also generated. RADIATION DOSE REDUCTION: This exam was performed according to the departmental dose-optimization program which includes automated exposure control, adjustment of the mA and/or kV according to patient size and/or use of iterative reconstruction technique. COMPARISON:  Lumbar spine radiographs 08/27/2014 FINDINGS: Segmentation: Standard; the lowest formed disc space is designated L5-S1. Alignment: There is trace retrolisthesis of L1 on L2 and L2 on L3, not significantly changed since 2016.  There is mild dextrocurvature centered at L2-L3. Vertebrae: Vertebral body heights are preserved. There is no evidence of acute injury. Densely sclerotic lesions in the L1 and L4 vertebral bodies most likely reflect bone islands (average and max attenuations at L1 are 938 and 1,409 Hounsfield units, respectively, and 968 and 1,268 Hounsfield units at L4, respectively. There is a prominent Schmorl's node indenting the superior L3 endplate. There is a lucent lesion without aggressive features in the right iliac bone. There are no  suspicious osseous lesions. Paraspinal and other soft tissues: The paraspinal soft tissues are unremarkable. The abdominal and pelvic viscera are assessed on the separately dictated CT abdomen/pelvis. Disc levels: There is marked intervertebral disc space narrowing at L2-L3. There is more mild disc space narrowing at the remaining levels. There is vacuum disc phenomenon at L5-S1. T12-L1: No significant spinal canal or neural foraminal stenosis. L1-L2: Mild degenerative endplate change and facet arthropathy without significant spinal canal or neural foraminal stenosis. L2-L3: There is degenerative endplate change and mild bilateral facet arthropathy with grade 1 retrolisthesis resulting in mild spinal canal stenosis without significant neural foraminal stenosis. L3-L4: There is a mild disc bulge and mild bilateral facet arthropathy without significant spinal canal or neural foraminal stenosis. L4-L5: There is a mild disc bulge, ligamentum flavum thickening, and moderate right worse than left facet arthropathy resulting in mild spinal canal stenosis without significant neural foraminal stenosis. L5-S1: There is a mild disc bulge and moderate right worse than left facet arthropathy without significant spinal canal or neural foraminal stenosis. IMPRESSION: 1. No evidence of acute injury in the lumbar spine. 2. Grade 1 retrolisthesis of L1 on L2 and L2 on L3, not significantly changed since 2016.  Mild dextrocurvature centered at L2-L3. 3. Marked intervertebral disc space narrowing at L2-L3. 4. Multilevel facet arthropathy, most advanced on the right at L4-L5 and L5-S1. 5. No high-grade spinal canal or neural foraminal stenosis. 6.  Aortic Atherosclerosis (ICD10-I70.0). Electronically Signed   By: Valetta Mole M.D.   On: 02/19/2021 15:34   DG Chest Port 1 View  Result Date: 02/19/2021 CLINICAL DATA:  Short of breath and weakness EXAM: PORTABLE CHEST 1 VIEW COMPARISON:  01/24/2021 FINDINGS: The heart size and mediastinal contours are within normal limits. Both lungs are clear. The visualized skeletal structures are unremarkable. IMPRESSION: No active disease. Electronically Signed   By: Franchot Gallo M.D.   On: 02/19/2021 14:57    My personal review of EKG: Rhythm sinus tachycardia, Rate  115 /min, QTc 454 , no Acute ST changes   Assessment & Plan:    Principal Problem:   AKI (acute kidney injury) ([place]) Active Problems:   Failure to thrive (child)   AKI -Doing of volume depletion, dehydration, with low blood pressure while taking antihypertensive medication. -Avoid hypotension, avoid nephrotoxic medications, and start on IV fluids. -We will check UA.  Failure to thrive/weight loss/ generalized /deconditioning/generalized joint pain -Main concern here for systemic rheumatological process, patient report he has a follow-up scheduled oncology in [place], report is either later this week or next week, I have emphasized to him the importance of to keep this appointment. -We will obtain basic labs including uric acid, CRP, ESR, total CK, RF, anti citrul peptide,, ANA, TSH and free T4 -Patient with generalized joint ache, but at could not see evidence of synovitis or joint inflammation on physical exam, does have some mild knee swelling, have requested IR to perform arthrocentesis ( to be done tomorrow), we will check it for crystals, cell count , at least this will help to rule in  gout/pseudogout. -He is up-to-date with his colonoscopy, with known history of Barrett's esophagus but he denies any swallowing problem -CT abdomen pelvis with no acute findings, will obtain CT chest given his history of smoking and Barrett's esophagus -I will start on IV Solu-Medrol to see if it helps with his symptoms.  He can be discharged on prednisone taper his seen by rheumatology.  Hypertension -blood pressure is low, keep on IV  fluids and hold antihypertensive medications  Hyperglycemia -No diagnosis of diabetes mellitus in the past, will check A1c, will keep on insulin sliding scale especially he will be started on steroids  Hyperlipidemia -Continue with statin  History of CAD -Continue with statin, aspirin, resume metoprolol and lisinopril when he is stabilized  GERD -Continue with PPI    DVT Prophylaxis Heparin   AM Labs Ordered, also please review Full Orders  Family Communication: Admission, patients condition and plan of care including tests being ordered have been discussed with the patient who indicate understanding and agree with the plan and Code Status.  Code Status Full  Likely DC to  home  Condition GUARDED    Consults called: none    Admission status: observation    Time spent in minutes : 65 minutes   Phillips Climes M.D on 02/19/2021 at 4:06 PM   [facility] - Office  [phone]

## 2021-02-20 ENCOUNTER — Encounter (HOSPITAL_COMMUNITY): Payer: Self-pay | Admitting: Internal Medicine

## 2021-02-20 ENCOUNTER — Observation Stay (HOSPITAL_COMMUNITY): Payer: Medicare Other

## 2021-02-20 DIAGNOSIS — K219 Gastro-esophageal reflux disease without esophagitis: Secondary | ICD-10-CM | POA: Diagnosis not present

## 2021-02-20 DIAGNOSIS — R7303 Prediabetes: Secondary | ICD-10-CM | POA: Diagnosis not present

## 2021-02-20 DIAGNOSIS — N179 Acute kidney failure, unspecified: Secondary | ICD-10-CM | POA: Diagnosis not present

## 2021-02-20 DIAGNOSIS — N1831 Chronic kidney disease, stage 3a: Secondary | ICD-10-CM | POA: Diagnosis present

## 2021-02-20 DIAGNOSIS — Z7982 Long term (current) use of aspirin: Secondary | ICD-10-CM | POA: Diagnosis not present

## 2021-02-20 DIAGNOSIS — M109 Gout, unspecified: Secondary | ICD-10-CM | POA: Diagnosis not present

## 2021-02-20 DIAGNOSIS — M2550 Pain in unspecified joint: Secondary | ICD-10-CM

## 2021-02-20 DIAGNOSIS — E440 Moderate protein-calorie malnutrition: Secondary | ICD-10-CM | POA: Diagnosis not present

## 2021-02-20 DIAGNOSIS — I959 Hypotension, unspecified: Secondary | ICD-10-CM | POA: Diagnosis not present

## 2021-02-20 DIAGNOSIS — Z6821 Body mass index (BMI) 21.0-21.9, adult: Secondary | ICD-10-CM | POA: Diagnosis not present

## 2021-02-20 DIAGNOSIS — Z20822 Contact with and (suspected) exposure to covid-19: Secondary | ICD-10-CM | POA: Diagnosis not present

## 2021-02-20 DIAGNOSIS — Z880 Allergy status to penicillin: Secondary | ICD-10-CM | POA: Diagnosis not present

## 2021-02-20 DIAGNOSIS — Z8249 Family history of ischemic heart disease and other diseases of the circulatory system: Secondary | ICD-10-CM | POA: Diagnosis not present

## 2021-02-20 DIAGNOSIS — R5381 Other malaise: Secondary | ICD-10-CM

## 2021-02-20 DIAGNOSIS — I509 Heart failure, unspecified: Secondary | ICD-10-CM | POA: Diagnosis not present

## 2021-02-20 DIAGNOSIS — Z87891 Personal history of nicotine dependence: Secondary | ICD-10-CM | POA: Diagnosis not present

## 2021-02-20 DIAGNOSIS — E785 Hyperlipidemia, unspecified: Secondary | ICD-10-CM | POA: Diagnosis not present

## 2021-02-20 DIAGNOSIS — K2270 Barrett's esophagus without dysplasia: Secondary | ICD-10-CM | POA: Diagnosis not present

## 2021-02-20 DIAGNOSIS — R54 Age-related physical debility: Secondary | ICD-10-CM | POA: Diagnosis not present

## 2021-02-20 DIAGNOSIS — I10 Essential (primary) hypertension: Secondary | ICD-10-CM

## 2021-02-20 DIAGNOSIS — Z79899 Other long term (current) drug therapy: Secondary | ICD-10-CM | POA: Diagnosis not present

## 2021-02-20 DIAGNOSIS — K22719 Barrett's esophagus with dysplasia, unspecified: Secondary | ICD-10-CM | POA: Diagnosis not present

## 2021-02-20 DIAGNOSIS — N1832 Chronic kidney disease, stage 3b: Secondary | ICD-10-CM

## 2021-02-20 DIAGNOSIS — M1990 Unspecified osteoarthritis, unspecified site: Secondary | ICD-10-CM | POA: Diagnosis not present

## 2021-02-20 DIAGNOSIS — R627 Adult failure to thrive: Secondary | ICD-10-CM | POA: Diagnosis not present

## 2021-02-20 DIAGNOSIS — E860 Dehydration: Secondary | ICD-10-CM | POA: Diagnosis not present

## 2021-02-20 DIAGNOSIS — I2510 Atherosclerotic heart disease of native coronary artery without angina pectoris: Secondary | ICD-10-CM | POA: Diagnosis not present

## 2021-02-20 DIAGNOSIS — Z91013 Allergy to seafood: Secondary | ICD-10-CM | POA: Diagnosis not present

## 2021-02-20 DIAGNOSIS — I130 Hypertensive heart and chronic kidney disease with heart failure and stage 1 through stage 4 chronic kidney disease, or unspecified chronic kidney disease: Secondary | ICD-10-CM | POA: Diagnosis not present

## 2021-02-20 LAB — CBC
HCT: 29.4 % — ABNORMAL LOW (ref 39.0–52.0)
Hemoglobin: 9.1 g/dL — ABNORMAL LOW (ref 13.0–17.0)
MCH: 25.6 pg — ABNORMAL LOW (ref 26.0–34.0)
MCHC: 31 g/dL (ref 30.0–36.0)
MCV: 82.8 fL (ref 80.0–100.0)
Platelets: 187 10*3/uL (ref 150–400)
RBC: 3.55 MIL/uL — ABNORMAL LOW (ref 4.22–5.81)
RDW: 17 % — ABNORMAL HIGH (ref 11.5–15.5)
WBC: 6.1 10*3/uL (ref 4.0–10.5)
nRBC: 0 % (ref 0.0–0.2)

## 2021-02-20 LAB — COMPREHENSIVE METABOLIC PANEL
ALT: 27 U/L (ref 0–44)
AST: 26 U/L (ref 15–41)
Albumin: 2.3 g/dL — ABNORMAL LOW (ref 3.5–5.0)
Alkaline Phosphatase: 120 U/L (ref 38–126)
Anion gap: 11 (ref 5–15)
BUN: 32 mg/dL — ABNORMAL HIGH (ref 8–23)
CO2: 22 mmol/L (ref 22–32)
Calcium: 9 mg/dL (ref 8.9–10.3)
Chloride: 106 mmol/L (ref 98–111)
Creatinine, Ser: 1.61 mg/dL — ABNORMAL HIGH (ref 0.61–1.24)
GFR, Estimated: 45 mL/min — ABNORMAL LOW (ref >60)
Glucose, Bld: 171 mg/dL — ABNORMAL HIGH (ref 70–99)
Potassium: 4 mmol/L (ref 3.5–5.1)
Sodium: 139 mmol/L (ref 135–145)
Total Bilirubin: 0.8 mg/dL (ref 0.3–1.2)
Total Protein: 6.7 g/dL (ref 6.5–8.1)

## 2021-02-20 LAB — GLUCOSE, CAPILLARY
Glucose-Capillary: 170 mg/dL — ABNORMAL HIGH (ref 70–99)
Glucose-Capillary: 203 mg/dL — ABNORMAL HIGH (ref 70–99)
Glucose-Capillary: 189 mg/dL — ABNORMAL HIGH (ref 70–99)
Glucose-Capillary: 129 mg/dL — ABNORMAL HIGH (ref 70–99)

## 2021-02-20 LAB — ANA W/REFLEX IF POSITIVE: Anti Nuclear Antibody (ANA): NEGATIVE

## 2021-02-20 LAB — CYCLIC CITRUL PEPTIDE ANTIBODY, IGG/IGA: CCP Antibodies IgG/IgA: 3 units (ref 0–19)

## 2021-02-20 LAB — RHEUMATOID FACTOR: Rheumatoid fact SerPl-aCnc: 18.3 IU/mL — ABNORMAL HIGH (ref <14.0)

## 2021-02-20 MED ORDER — ENSURE ENLIVE PO LIQD
237.0000 mL | Freq: Two times a day (BID) | ORAL | Status: DC
  Administered 2021-02-21: 237 mL via ORAL

## 2021-02-20 MED ORDER — POVIDONE-IODINE 10 % EX SOLN
CUTANEOUS | Status: AC
  Filled 2021-02-20: qty 14.8

## 2021-02-20 MED ORDER — LIDOCAINE HCL (PF) 1 % IJ SOLN
INTRAMUSCULAR | Status: AC
  Filled 2021-02-20: qty 5

## 2021-02-20 NOTE — Evaluation (Signed)
Physical Therapy Evaluation Patient Details Name: Phillip Pace MRN: [id number] DOB: [birth year] Today's Date: 02/20/2021  History of Present Illness  Phillip Pace  is a 76 y.o. male, past medical history of CAD, COPD, hyperlipidemia, hypertension, resents to ED from PCP office for hypotension and generalized body ache, patient reports weight loss around 20 pounds for last few weeks, and loss of appetite, generalized weakness and increased frailty, no oral intake for last 4 days, he denies fever, chills, cough, nausea or vomiting, no diarrhea or constipation, but he does endorse generalized body ache, mainly joint, both knees, wrists and elbows, reported history of gout, but reports current joint pain does not resemble previous gout episodes, patient went to PCP, where he was found to be hypotensive, here received 1 L fluid bolus and instructed to come to ED.  - in ED patient was hypotensive on presentation, he did respond to fluid bolus, work-up significant for sodium of 131, creatinine of 1.78, increased from baseline of 1.09, as well his glucose elevated at 187, CT abdomen pelvis with no acute findings to explain weight loss, CT lumbar spine significant for severe radiculopathy, [facility] hospitalist consulted to admit.   Clinical Impression  Patient demonstrates slow labored movement for sitting up at bedside requiring use of bed rail with HOB flat, very unsteady on feet when standing using RW with frequent buckling of knees and limited to a few side steps before having to sit due to generalized weakness and c/o fatigue.  Patient tolerated sitting up in chair after therapy.  Patient will benefit from continued skilled physical therapy in hospital and recommended venue below to increase strength, balance, endurance for safe ADLs and gait.          Recommendations for follow up therapy are one component of a multi-disciplinary discharge planning process, led by the attending physician.  Recommendations  may be updated based on patient status, additional functional criteria and insurance authorization.  Follow Up Recommendations Skilled nursing-short term rehab (<3 hours/day)    Assistance Recommended at Discharge Intermittent Supervision/Assistance  Patient can return home with the following  A lot of help with walking and/or transfers;A lot of help with bathing/dressing/bathroom;Help with stairs or ramp for entrance    Equipment Recommendations None recommended by PT  Recommendations for Other Services       Functional Status Assessment Patient has had a recent decline in their functional status and demonstrates the ability to make significant improvements in function in a reasonable and predictable amount of time.     Precautions / Restrictions Precautions Precautions: Fall Restrictions Weight Bearing Restrictions: No      Mobility  Bed Mobility Overal bed mobility: Needs Assistance Bed Mobility: Supine to Sit     Supine to sit: Supervision     General bed mobility comments: increased time, labored movement, required use of bed rail with HOB flat    Transfers Overall transfer level: Needs assistance Equipment used: Rolling walker (2 wheels) Transfers: Sit to/from Stand, Bed to chair/wheelchair/BSC Sit to Stand: Mod assist, Min assist   Step pivot transfers: Mod assist       General transfer comment: very unsteady slow labored movement    Ambulation/Gait Ambulation/Gait assistance: Mod assist Gait Distance (Feet): 5 Feet Assistive device: Rolling walker (2 wheels) Gait Pattern/deviations: Decreased step length - right, Decreased step length - left, Decreased stride length Gait velocity: decreased     General Gait Details: limited to a few slow labored unsteady side steps mostly due to generalized weakness, fatigue  Stairs            Wheelchair Mobility    Modified Rankin (Stroke Patients Only)       Balance Overall balance assessment: Needs  assistance Sitting-balance support: Feet supported, No upper extremity supported Sitting balance-Leahy Scale: Fair Sitting balance - Comments: fair/good seated at EOB   Standing balance support: Reliant on assistive device for balance, During functional activity, Bilateral upper extremity supported Standing balance-Leahy Scale: Poor Standing balance comment: fair/poor using RW                             Pertinent Vitals/Pain Pain Assessment Pain Assessment: 0-10 Pain Score: 8  Pain Location: low back, bilateral knees Pain Descriptors / Indicators: Sharp, Shooting, Jabbing Pain Intervention(s): Limited activity within patient's tolerance, Monitored during session, Repositioned    Home Living Family/patient expects to be discharged to:: Private residence Living Arrangements: Non-relatives/Friends Available Help at Discharge: Friend(s);Available PRN/intermittently Type of Home: House Home Access: Stairs to enter Entrance Stairs-Rails: None Entrance Stairs-Number of Steps: 2   Home Layout: Able to live on main level with bedroom/bathroom;Two level Home Equipment: Wheelchair - Publishing copy (2 wheels);Cane - single point      Prior Function Prior Level of Function : Independent/Modified Independent;Driving             Mobility Comments: assisted tranfers and limited to a few steps using RW, using w/c mostly since since last admission, was [employer], drove ADLs Comments: Independent     Hand Dominance   Dominant Hand: Right    Extremity/Trunk Assessment   Upper Extremity Assessment Upper Extremity Assessment: Defer to OT evaluation    Lower Extremity Assessment Lower Extremity Assessment: Generalized weakness    Cervical / Trunk Assessment Cervical / Trunk Assessment: Normal  Communication   Communication: No difficulties  Cognition Arousal/Alertness: Awake/alert Behavior During Therapy: WFL for tasks assessed/performed Overall  Cognitive Status: Within Functional Limits for tasks assessed                                          General Comments      Exercises     Assessment/Plan    PT Assessment Patient needs continued PT services  PT Problem List Decreased strength;Decreased activity tolerance;Decreased balance;Decreased mobility       PT Treatment Interventions DME instruction;Gait training;Stair training;Functional mobility training;Therapeutic exercise;Therapeutic activities;Patient/family education;Balance training    PT Goals (Current goals can be found in the Care Plan section)  Acute Rehab PT Goals Patient Stated Goal: return home able to walk after rehab PT Goal Formulation: With patient Time For Goal Achievement: 03/06/21 Potential to Achieve Goals: Good    Frequency Min 3X/week     Co-evaluation PT/OT/SLP Co-Evaluation/Treatment: Yes Reason for Co-Treatment: To address functional/ADL transfers PT goals addressed during session: Mobility/safety with mobility;Balance;Proper use of DME OT goals addressed during session: ADL's and self-care       AM-PAC PT "6 Clicks" Mobility  Outcome Measure Help needed turning from your back to your side while in a flat bed without using bedrails?: None Help needed moving from lying on your back to sitting on the side of a flat bed without using bedrails?: A Little Help needed moving to and from a bed to a chair (including a wheelchair)?: A Lot Help needed standing up from a chair using your arms (e.g., wheelchair  or bedside chair)?: A Lot Help needed to walk in hospital room?: A Lot Help needed climbing 3-5 steps with a railing? : Total 6 Click Score: 14    End of Session   Activity Tolerance: Patient tolerated treatment well;Patient limited by fatigue Patient left: in chair;with call bell/phone within reach Nurse Communication: Mobility status PT Visit Diagnosis: Unsteadiness on feet (R26.81);Other abnormalities of gait and  mobility (R26.89);Muscle weakness (generalized) (M62.81)    Time: 6701-4103 PT Time Calculation (min) (ACUTE ONLY): 27 min   Charges:   PT Evaluation $PT Eval Moderate Complexity: 1 Mod PT Treatments $Therapeutic Activity: 23-37 mins        12:13 PM, 02/20/21 [name], MPT Physical Therapist with [facility] 336 [phone] office [phone] mobile phone

## 2021-02-20 NOTE — Plan of Care (Signed)
°  Problem: Acute Rehab PT Goals(only PT should resolve) Goal: Pt Will Go Supine/Side To Sit Outcome: Progressing [place]she[place] (Taken 02/20/2021 1214) Pt will go Supine/Side to Sit:  with modified independence  with supervision Goal: Patient Will Transfer Sit To/From Stand Outcome: Progressing [place] (Taken 02/20/2021 1214) Patient will transfer sit to/from stand: with minimal assist Goal: Pt Will Transfer Bed To Chair/Chair To Bed Outcome: Progressing Flowsheets (Taken 02/20/2021 1214) Pt will Transfer Bed to Chair/Chair to Bed:  with min assist  min guard assist Goal: Pt Will Ambulate Outcome: Progressing Flowsheets (Taken 02/20/2021 1214) Pt will Ambulate:  25 feet  with minimal assist  with rolling walker   12:15 PM, 02/20/21 [name], MPT Physical Therapist with [facility] 336 [phone] office [phone] mobile phone

## 2021-02-20 NOTE — TOC Initial Note (Signed)
Transition of Care [facility]) - Initial/Assessment Note    Patient Details  Name: Phillip Pace MRN: [id number] Date of Birth: [birth year]  Transition of [facility]) CM/SW Contact:    Iona Beard, [place] Phone Number: 02/20/2021, 11:43 AM  Clinical Narrative:                 CSW spoke with pt to complete assessment. Pt states that he lives with his girlfriend. Pt is independent in completing his ADLs. Pt is able to drive when needed. Pt has not had HH in the past. Pt has a wheelchair and cane to use when needed. CSW spoke with pt about PT recommending SNF at d/c. Pt is agreeable to this. Pt has been to [facility] and rehab in the past. CSW spoke with Ebony Hail at the facility and states pt used 17 days and can return to their facility. CSW spoke with pt about this. Pt would prefer to go to rehab in [place] as it is closer to his home/girlfriend. CSW completed Fl2 and sent out to local facilities at request of pt. TOC to follow.   Expected Discharge Plan: Skilled Nursing Facility Barriers to Discharge: Continued Medical Work up   Patient Goals and CMS Choice Patient states their goals for this hospitalization and ongoing recovery are:: Go to SNF CMS Medicare.gov Compare Post Acute Care list provided to:: Patient Choice offered to / list presented to : Patient  Expected Discharge Plan and Services Expected Discharge Plan: [place] In-house Referral: Clinical Social Work Discharge Planning Services: CM Consult Post Acute Care Choice: [place] Living arrangements for the past 2 months: [place]                                      Prior Living Arrangements/Services Living arrangements for the past 2 months: Single Family Home Lives with:: Significant Other Patient language and need for interpreter reviewed:: Yes Do you feel safe going back to the place where you live?: Yes      Need for Family Participation in Patient Care: Yes  (Comment) Care giver support system in place?: Yes (comment) Current home services: DME Criminal Activity/Legal Involvement Pertinent to Current Situation/Hospitalization: No - Comment as needed  Activities of Daily Living Home Assistive Devices/Equipment: Eyeglasses, Dentures (specify type) (upper and lower dentures at home currently) ADL Screening (condition at time of admission) Patient's cognitive ability adequate to safely complete daily activities?: Yes Is the patient deaf or have difficulty hearing?: No Does the patient have difficulty seeing, even when wearing glasses/contacts?: No Does the patient have difficulty concentrating, remembering, or making decisions?: No Patient able to express need for assistance with ADLs?: Yes Does the patient have difficulty dressing or bathing?: No Independently performs ADLs?: No Communication: Independent Dressing (OT): Independent Grooming: Independent Feeding: Independent Bathing: Independent Toileting: Needs assistance Is this a change from baseline?: Pre-admission baseline (patient states its been this way for past 3 weeks) In/Out Bed: Needs assistance Is this a change from baseline?: Pre-admission baseline (patient states its been this way for past 3 weeks) Walks in Home: Dependent (per patient states he cannot walk for past 3 weeks) Is this a change from baseline?: Pre-admission baseline (For past 3 weeks) Does the patient have difficulty walking or climbing stairs?: Yes Weakness of Legs: Both Weakness of Arms/Hands: Both  Permission Sought/Granted  Emotional Assessment Appearance:: Appears stated age Attitude/Demeanor/Rapport: Engaged   Orientation: : Oriented to Self, Oriented to Place, Oriented to  Time, Oriented to Situation Alcohol / Substance Use: Not Applicable Psych Involvement: No (comment)  Admission diagnosis:  Joint pain [M25.50] Swelling [R60.9] AKI (acute kidney injury) ([place])  [N17.9] Arthralgia, unspecified joint [M25.50] Patient Active Problem List   Diagnosis Date Noted   AKI (acute kidney injury) ([place]) 02/19/2021   Failure to thrive (child) 02/19/2021   Arthralgia    Barrett's esophagus 08/12/2017   History of adenomatous polyp of colon 04/07/2017   Family history of colon cancer 04/07/2017   Dysphagia 04/07/2017   Chest pain 07/20/2014   ARF (acute renal failure) ([place]) 07/20/2014   Hyperglycemia 07/20/2014   Fever 07/20/2014   PCP:  Celene Squibb, MD Pharmacy:   [facility], [place] AT [place] [place] [address] Phone: [phone] Fax: [phone]     Social Determinants of Health (SDOH) Interventions    Readmission Risk Interventions No flowsheet data found.

## 2021-02-20 NOTE — NC FL2 (Signed)
[place] LEVEL OF CARE SCREENING TOOL     IDENTIFICATION  Patient Name: Phillip Pace Birthdate: 07-Mar-1945 Sex: male Admission Date (Current Location): 02/19/2021  [facility] and [place] Number:  [employer] and Address:  [place] [address] [place]      Provider Number: [id number]  Attending Physician Name and Address:  Barton Dubois, MD  Relative Name and Phone Number:       Current Level of Care: Hospital Recommended Level of Care: [place] Prior Approval Number:    Date Approved/Denied:   PASRR Number: [id number]  Discharge Plan: SNF    Current Diagnoses: Patient Active Problem List   Diagnosis Date Noted   AKI (acute kidney injury) ([place]) 02/19/2021   Failure to thrive (child) 02/19/2021   Arthralgia    Barrett's esophagus 08/12/2017   History of adenomatous polyp of colon 04/07/2017   Family history of colon cancer 04/07/2017   Dysphagia 04/07/2017   Chest pain 07/20/2014   ARF (acute renal failure) ([place]) 07/20/2014   Hyperglycemia 07/20/2014   Fever 07/20/2014    Orientation RESPIRATION BLADDER Height & Weight     Self, Time, Situation, Place  Normal Continent Weight: 175 lb 14.8 oz (79.8 kg) Height:  6\' 3"  (190.5 cm)  BEHAVIORAL SYMPTOMS/MOOD NEUROLOGICAL BOWEL NUTRITION STATUS      Continent Diet (Heart Healthy)  AMBULATORY STATUS COMMUNICATION OF NEEDS Skin   Extensive Assist Verbally Normal                       Personal Care Assistance Level of Assistance  Bathing, Feeding, Dressing Bathing Assistance: Limited assistance Feeding assistance: Independent Dressing Assistance: Limited assistance     Functional Limitations Info  Sight, Speech, Hearing Sight Info: Adequate Hearing Info: Adequate Speech Info: Adequate    SPECIAL CARE FACTORS FREQUENCY  OT (By licensed OT), PT (By licensed PT)     PT Frequency: 5 times weekly OT Frequency: 5 times weekly             Contractures Contractures Info: Not present    Additional Factors Info  Code Status, Allergies Code Status Info: FULL Allergies Info: Penicillins and shellfish Allergy           Current Medications (02/20/2021):  This is the current hospital active medication list Current Facility-Administered Medications  Medication Dose Route Frequency Provider Last Rate Last Admin   0.9 % NaCl with KCl 20 mEq/ L  infusion   Intravenous Continuous Elgergawy, Silver Huguenin, MD 100 mL/hr at 02/20/21 0634 New Bag at 02/20/21 3154   acetaminophen (TYLENOL) tablet 650 mg  650 mg Oral Q6H PRN Elgergawy, Silver Huguenin, MD       Or   acetaminophen (TYLENOL) suppository 650 mg  650 mg Rectal Q6H PRN Elgergawy, Silver Huguenin, MD       allopurinol (ZYLOPRIM) tablet 100 mg  100 mg Oral Daily Elgergawy, Silver Huguenin, MD   100 mg at 02/20/21 0841   aspirin EC tablet 81 mg  81 mg Oral Daily Elgergawy, Silver Huguenin, MD   81 mg at 02/20/21 0841   B-complex with vitamin C tablet 1 tablet  1 tablet Oral Daily Elgergawy, Silver Huguenin, MD   1 tablet at 02/20/21 0841   heparin injection 5,000 Units  5,000 Units Subcutaneous Q8H Elgergawy, Silver Huguenin, MD       insulin aspart (novoLOG) injection 0-5 Units  0-5 Units Subcutaneous QHS Elgergawy, Silver Huguenin, MD  insulin aspart (novoLOG) injection 0-9 Units  0-9 Units Subcutaneous TID WC Elgergawy, Silver Huguenin, MD   2 Units at 02/20/21 0841   methylPREDNISolone sodium succinate (SOLU-MEDROL) 125 mg/2 mL injection 80 mg  80 mg Intravenous Q12H Elgergawy, Silver Huguenin, MD   80 mg at 02/20/21 9971   oxyCODONE (Oxy IR/ROXICODONE) immediate release tablet 5 mg  5 mg Oral Q4H PRN Elgergawy, Silver Huguenin, MD   5 mg at 02/20/21 0847   pantoprazole (PROTONIX) EC tablet 40 mg  40 mg Oral Daily Elgergawy, Silver Huguenin, MD   40 mg at 02/20/21 0841   povidone-iodine (BETADINE) 10 % external solution              Discharge Medications: Please see discharge summary for a list of discharge medications.  Relevant Imaging  Results:  Relevant Lab Results:   Additional Information SSN: [id number]  [place]

## 2021-02-20 NOTE — Progress Notes (Signed)
PROGRESS NOTE    Phillip Pace  LAG:[id number] DOB: Jul 10, 1945 DOA: 02/19/2021 PCP: Celene Squibb, MD (Confirm with patient/family/NH records and if not entered, this HAS to be entered at [facility] point of entry. "No PCP" if truly none.)   Chief Complaint  Patient presents with   Hypotension   Weakness    Brief Narrative:  As per H&P written by Dr. Waldron Labs 02/19/2021  Phillip Pace  is a 76 y.o. male, past medical history of CAD, COPD, hyperlipidemia, hypertension, resents to ED from PCP office for hypotension and generalized body ache, patient reports weight loss around 20 pounds for last few weeks, and loss of appetite, generalized weakness and increased frailty, no oral intake for last 4 days, he denies fever, chills, cough, nausea or vomiting, no diarrhea or constipation, but he does endorse generalized body ache, mainly joint, both knees, wrists and elbows, reported history of gout, but reports current joint pain does not resemble previous gout episodes, patient went to PCP, where he was found to be hypotensive, here received 1 L fluid bolus and instructed to come to ED. - in ED patient was hypotensive on presentation, he did respond to fluid bolus, work-up significant for sodium of 131, creatinine of 1.78, increased from baseline of 1.09, as well his glucose elevated at 187, CT abdomen pelvis with no acute findings to explain weight loss, CT lumbar spine significant for severe radiculopathy, [facility] hospitalist consulted to admit.  Assessment & Plan: 1-acute renal failure superimposed on a stage IIIb chronic kidney disease  -In the setting of prerenal azotemia, hypotension, dehydration and continued use of nephrotoxic agents.   -Urinalysis not demonstrating infection -Continue fluid resuscitation -Continue to follow renal function trend  -Continue supportive care.    2-hypertension: Presented with hypotension -Continue IV fluids -Continue holding antihypertensive  regimen.  3-hyperlipidemia -Continue statin.  4-history of coronary disease -Continue the use of statin and aspirin -While blood pressure stable will resume the use of metoprolol -no complaining of chest pain. -EKG and telemetry without acute ischemic changes.  5-gastroesophageal flux disease -Continue PPI  6-physical deconditioning, weight loss, generalized weakness, failure to thrive and generalized joint pain -With concern for underlying rheumatologic problems -Continue to follow results for uric acid, CRP, ESR, rheumatoid factor and anti-CCP related peptide -S/p joint tap; follow joint fluid results.  7-physical deconditioning -Physical therapy has seen patient with recommendations for rehab at SNF for safety. -Significant limitation in his ability to perform ADL S at this time. -[name] in agreement to pursuit rehab and social worker is helping with placement.   DVT prophylaxis: Heparin Code Status: Full code Family Communication: no family at bedside. Disposition:   Status is: Inpatient    Consultants:  None  Procedures:  See below for x-ray reports.  Antimicrobials:  None   Subjective: Afebrile, no chest pain, no nausea, no vomiting.  Patient reports feeling weak and tired.  Complaining of multiple joint pain and is very deconditioned.  Appetite continues to be poor.  Objective: Vitals:   02/20/21 0502 02/20/21 0753 02/20/21 1108 02/20/21 1437  BP: 110/69  (!) 148/67 (!) 123/94  Pulse: 60  82 99  Resp:   20 16  Temp: 98.9 F (37.2 C)  97.9 F (36.6 C) (!) 97.3 F (36.3 C)  TempSrc:   Oral Oral  SpO2: 91%  95% 98%  Weight:  79.8 kg    Height:        Intake/Output Summary (Last 24 hours) at 02/20/2021 1457 Last data filed  at 02/20/2021 0952 Gross per 24 hour  Intake 1440 ml  Output 600 ml  Net 840 ml   Filed Weights   02/19/21 2008 02/20/21 0753  Weight: 78.4 kg 79.8 kg    Examination:  General exam: Awake, deconditioned and  chronically ill in appearance; denies nausea or vomiting.  Still with decreased appetite.  Complaining of multijoint pain. Respiratory system: Clear to auscultation. Respiratory effort normal.  No requiring oxygen supplementation.  Good saturation on room air. Cardiovascular system: S1 & S2 heard, RRR. No JVD, murmurs, rubs, gallops or clicks. No pedal edema. Gastrointestinal system: Abdomen is nondistended, soft and nontender. No organomegaly or masses felt. Normal bowel sounds heard. Central nervous system: No focal neurological deficits. Extremities: No cyanosis or clubbing. Skin: No rashes, lesions or ulcers Psychiatry: Mood & affect appropriate.    Data Reviewed: I have personally reviewed following labs and imaging studies  CBC: Recent Labs  Lab 02/19/21 1439 02/20/21 0521  WBC 9.2 6.1  NEUTROABS 8.3*  --   HGB 9.1* 9.1*  HCT 28.7* 29.4*  MCV 84.7 82.8  PLT 169 923    Basic Metabolic Panel: Recent Labs  Lab 02/19/21 1439 02/20/21 0521  NA 131* 139  K 3.8 4.0  CL 98 106  CO2 21* 22  GLUCOSE 187* 171*  BUN 25* 32*  CREATININE 1.78* 1.61*  CALCIUM 8.5* 9.0    GFR: Estimated Creatinine Clearance: 45.4 mL/min (A) (by C-G formula based on SCr of 1.61 mg/dL (H)).  Liver Function Tests: Recent Labs  Lab 02/19/21 1439 02/20/21 0521  AST 24 26  ALT 27 27  ALKPHOS 139* 120  BILITOT 0.8 0.8  PROT 6.9 6.7  ALBUMIN 2.6* 2.3*    CBG: Recent Labs  Lab 02/19/21 1639 02/19/21 2251 02/20/21 0728 02/20/21 1136  GLUCAP 147* 163* 170* 203*    Recent Results (from the past 240 hour(s))  Resp Panel by RT-PCR (Flu A&B, Covid) Nasopharyngeal Swab     Status: None   Collection Time: 02/19/21  7:49 PM   Specimen: Nasopharyngeal Swab; Nasopharyngeal(NP) swabs in vial transport medium  Result Value Ref Range Status   SARS Coronavirus 2 by RT PCR NEGATIVE NEGATIVE Final    Comment: (NOTE) SARS-CoV-2 target nucleic acids are NOT DETECTED.  The SARS-CoV-2 RNA is  generally detectable in upper respiratory specimens during the acute phase of infection. The lowest concentration of SARS-CoV-2 viral copies this assay can detect is 138 copies/mL. A negative result does not preclude SARS-Cov-2 infection and should not be used as the sole basis for treatment or other patient management decisions. A negative result may occur with  improper specimen collection/handling, submission of specimen other than nasopharyngeal swab, presence of viral mutation(s) within the areas targeted by this assay, and inadequate number of viral copies(<138 copies/mL). A negative result must be combined with clinical observations, patient history, and epidemiological information. The expected result is Negative.  Fact Sheet for Patients:  EntrepreneurPulse.com.au  Fact Sheet for Healthcare Providers:  IncredibleEmployment.be  This test is no t yet approved or cleared by the [place] FDA and  has been authorized for detection and/or diagnosis of SARS-CoV-2 by FDA under an Emergency Use Authorization (EUA). This EUA will remain  in effect (meaning this test can be used) for the duration of the COVID-19 declaration under Section 564(b)(1) of the Act, 21 U.S.C.section 360bbb-3(b)(1), unless the authorization is terminated  or revoked sooner.       Influenza A by PCR NEGATIVE NEGATIVE Final  Influenza B by PCR NEGATIVE NEGATIVE Final    Comment: (NOTE) The Xpert Xpress SARS-CoV-2/FLU/RSV plus assay is intended as an aid in the diagnosis of influenza from Nasopharyngeal swab specimens and should not be used as a sole basis for treatment. Nasal washings and aspirates are unacceptable for Xpert Xpress SARS-CoV-2/FLU/RSV testing.  Fact Sheet for Patients: EntrepreneurPulse.com.au  Fact Sheet for Healthcare Providers: IncredibleEmployment.be  This test is not yet approved or cleared by the [place] FDA and has been authorized for detection and/or diagnosis of SARS-CoV-2 by FDA under an Emergency Use Authorization (EUA). This EUA will remain in effect (meaning this test can be used) for the duration of the COVID-19 declaration under Section 564(b)(1) of the Act, 21 U.S.C. section 360bbb-3(b)(1), unless the authorization is terminated or revoked.  Performed at [facility], [address], [place], [place] [address]      Radiology Studies: CT ABDOMEN PELVIS WO CONTRAST  Result Date: 02/19/2021 CLINICAL DATA:  Weakness, lower back pain, hypotension, weight loss EXAM: CT ABDOMEN AND PELVIS WITHOUT CONTRAST TECHNIQUE: Multidetector CT imaging of the abdomen and pelvis was performed following the standard protocol without IV contrast. RADIATION DOSE REDUCTION: This exam was performed according to the departmental dose-optimization program which includes automated exposure control, adjustment of the mA and/or kV according to patient size and/or use of iterative reconstruction technique. COMPARISON:  None. FINDINGS: Lower chest: There is subsegmental atelectasis in the lung bases. Coronary artery calcifications are noted. There is trace pericardial fluid. Hepatobiliary: There is a small calcified granuloma in the left hepatic lobe. The liver is otherwise unremarkable, within the confines of noncontrast technique. The gallbladder is unremarkable. There is no biliary ductal dilatation. Pancreas: Unremarkable. Spleen: Multiple calcified granulomas are seen in the spleen. The spleen is otherwise unremarkable. Adrenals/Urinary Tract: The adrenals are unremarkable. The kidneys are unremarkable, with no focal lesion, stone, hydronephrosis, or hydroureter. The bladder is unremarkable. Stomach/Bowel: The stomach is unremarkable there is no evidence of bowel obstruction. There is no abnormal bowel wall thickening or inflammatory change. The appendix is normal. Vascular/Lymphatic: There is scattered  calcified atherosclerotic plaque throughout the nonaneurysmal abdominal aorta. There is no abdominopelvic lymphadenopathy. Reproductive: The prostate and seminal vesicles are unremarkable. Other: There is no ascites or free air. Musculoskeletal: There is no acute osseous abnormality or aggressive osseous lesion. The lumbar spine is assessed in full on the separately dictated CT lumbar spine. IMPRESSION: 1. No acute findings in the abdomen or pelvis. No finding to explain the patient's presentation or weight loss. 2. Coronary artery calcifications and trace pericardial fluid. Electronically Signed   By: Valetta Mole M.D.   On: 02/19/2021 15:26   CT CHEST WO CONTRAST  Result Date: 02/19/2021 CLINICAL DATA:  Late loss, history of smoking and Barrett's esophagus EXAM: CT CHEST WITHOUT CONTRAST TECHNIQUE: Multidetector CT imaging of the chest was performed following the standard protocol without IV contrast. RADIATION DOSE REDUCTION: This exam was performed according to the departmental dose-optimization program which includes automated exposure control, adjustment of the mA and/or kV according to patient size and/or use of iterative reconstruction technique. COMPARISON:  No prior chest CT. FINDINGS: Cardiovascular: Three-vessel aortic arch. Mild aortic atherosclerosis. The aorta appears to be within normal limits for size. The heart is normal in size. Small volume pericardial fluid. Severe coronary artery calcifications. Mediastinum/Nodes: Calcified bilateral hilar and mediastinal lymph nodes, likely sequela of prior granulomatous disease. Paraesophageal lymph nodes measure up to 7 mm in short axis (series 2, image 89 and 103).  Prominent mediastinal lymph nodes measure up to 6 mm (series 2, image 54), but retain their normal reniform morphology. Evaluation of hilar lymph nodes is somewhat limited in the absence of intravenous contrast. The thyroid gland is unremarkable. The esophagus and trachea demonstrate no  significant abnormality. Lungs/Pleura: Apical predominant centrilobular emphysema. Dependent atelectasis. No focal pulmonary opacity. Multiple tiny calcified nodules, likely sequela of prior granulomatous disease. No suspicious pulmonary nodule. Upper Abdomen: Please see same-day CT abdomen pelvis. Musculoskeletal: Degenerative changes in the thoracic spine. No acute osseous abnormality. IMPRESSION: 1. Prominent paraesophageal lymph nodes, which measure up to 7 mm. No other definite lymphadenopathy, although evaluation of hilar lymph nodes is limited by the absence of intravenous contrast. 2. No focal pulmonary opacity or definite neoplasm. 3. Small volume pericardial fluid. 4. Severe coronary artery calcifications. Mild aortic atherosclerosis (ICD10-I70.0). 5. For findings in the abdomen please see same-day CT abdomen pelvis. Electronically Signed   By: Merilyn Baba M.D.   On: 02/19/2021 17:17   [place] LIMITED JOINT SPACE STRUCTURES LOW LEFT  Result Date: 02/20/2021 CLINICAL DATA:  LEFT knee joint effusion, question gout EXAM: ULTRASOUND LEFT LOWER EXTREMITY LIMITED TECHNIQUE: Ultrasound examination of the lower extremity soft tissues was performed in the area of clinical concern, the LEFT knee joint. COMPARISON:  LEFT knee radiographs 02/19/2021 FINDINGS: Minimal joint effusion at suprapatellar recess. No additional joint fluid identified. No adequate pocket of fluid is seen to allow for arthrocentesis. IMPRESSION: Minimal joint effusion, insufficient for arthrocentesis. Electronically Signed   By: Lavonia Dana M.D.   On: 02/20/2021 11:29   CT L-SPINE NO CHARGE  Result Date: 02/19/2021 CLINICAL DATA:  Low back pain, weight loss weakness, hypotension EXAM: CT LUMBAR SPINE WITHOUT CONTRAST TECHNIQUE: Multidetector CT imaging of the lumbar spine was performed without intravenous contrast administration. Multiplanar CT image reconstructions were also generated. RADIATION DOSE REDUCTION: This exam was performed  according to the departmental dose-optimization program which includes automated exposure control, adjustment of the mA and/or kV according to patient size and/or use of iterative reconstruction technique. COMPARISON:  Lumbar spine radiographs 08/27/2014 FINDINGS: Segmentation: Standard; the lowest formed disc space is designated L5-S1. Alignment: There is trace retrolisthesis of L1 on L2 and L2 on L3, not significantly changed since 2016. There is mild dextrocurvature centered at L2-L3. Vertebrae: Vertebral body heights are preserved. There is no evidence of acute injury. Densely sclerotic lesions in the L1 and L4 vertebral bodies most likely reflect bone islands (average and max attenuations at L1 are 938 and 1,409 Hounsfield units, respectively, and 968 and 1,268 Hounsfield units at L4, respectively. There is a prominent Schmorl's node indenting the superior L3 endplate. There is a lucent lesion without aggressive features in the right iliac bone. There are no suspicious osseous lesions. Paraspinal and other soft tissues: The paraspinal soft tissues are unremarkable. The abdominal and pelvic viscera are assessed on the separately dictated CT abdomen/pelvis. Disc levels: There is marked intervertebral disc space narrowing at L2-L3. There is more mild disc space narrowing at the remaining levels. There is vacuum disc phenomenon at L5-S1. T12-L1: No significant spinal canal or neural foraminal stenosis. L1-L2: Mild degenerative endplate change and facet arthropathy without significant spinal canal or neural foraminal stenosis. L2-L3: There is degenerative endplate change and mild bilateral facet arthropathy with grade 1 retrolisthesis resulting in mild spinal canal stenosis without significant neural foraminal stenosis. L3-L4: There is a mild disc bulge and mild bilateral facet arthropathy without significant spinal canal or neural foraminal stenosis. L4-L5: There is a  mild disc bulge, ligamentum flavum thickening,  and moderate right worse than left facet arthropathy resulting in mild spinal canal stenosis without significant neural foraminal stenosis. L5-S1: There is a mild disc bulge and moderate right worse than left facet arthropathy without significant spinal canal or neural foraminal stenosis. IMPRESSION: 1. No evidence of acute injury in the lumbar spine. 2. Grade 1 retrolisthesis of L1 on L2 and L2 on L3, not significantly changed since 2016. Mild dextrocurvature centered at L2-L3. 3. Marked intervertebral disc space narrowing at L2-L3. 4. Multilevel facet arthropathy, most advanced on the right at L4-L5 and L5-S1. 5. No high-grade spinal canal or neural foraminal stenosis. 6.  Aortic Atherosclerosis (ICD10-I70.0). Electronically Signed   By: Valetta Mole M.D.   On: 02/19/2021 15:34   DG Chest Port 1 View  Result Date: 02/19/2021 CLINICAL DATA:  Short of breath and weakness EXAM: PORTABLE CHEST 1 VIEW COMPARISON:  01/24/2021 FINDINGS: The heart size and mediastinal contours are within normal limits. Both lungs are clear. The visualized skeletal structures are unremarkable. IMPRESSION: No active disease. Electronically Signed   By: Franchot Gallo M.D.   On: 02/19/2021 14:57   DG Knee 3 Views Left  Result Date: 02/19/2021 CLINICAL DATA:  Left knee pain. EXAM: LEFT KNEE - 3 VIEW COMPARISON:  01/18/2021 FINDINGS: There appears to be resolution of the prior moderate knee joint effusion. Mild superior patellar degenerative osteophytosis, unchanged. Mild medial and lateral patellar osteophytosis. The medial and lateral compartment joint spaces are maintained. Possible minimal patellofemoral joint space narrowing. No acute fracture or dislocation. IMPRESSION:: IMPRESSION: 1. Resolution of the prior joint effusion. 2. Mild patellofemoral osteoarthritis. Electronically Signed   By: Yvonne Kendall M.D.   On: 02/19/2021 16:50     Scheduled Meds:  allopurinol  100 mg Oral Daily   aspirin EC  81 mg Oral Daily    B-complex with vitamin C  1 tablet Oral Daily   heparin  5,000 Units Subcutaneous Q8H   insulin aspart  0-5 Units Subcutaneous QHS   insulin aspart  0-9 Units Subcutaneous TID WC   methylPREDNISolone (SOLU-MEDROL) injection  80 mg Intravenous Q12H   pantoprazole  40 mg Oral Daily   povidone-iodine       Continuous Infusions:  0.9 % NaCl with KCl 20 mEq / L 100 mL/hr at 02/20/21 4961     LOS: 0 days    Barton Dubois, MD [facility]   To contact the attending provider between 7A-7P or the covering provider during after hours 7P-7A, please log into the web site [email or web address] and access using universal [place] password for that web site. If you do not have the password, please call the hospital operator.  02/20/2021, 2:57 PM

## 2021-02-20 NOTE — Evaluation (Signed)
Occupational Therapy Evaluation Patient Details Name: Phillip Pace MRN: [id number] DOB: 1945-04-04 Today's Date: 02/20/2021   History of Present Illness Phillip Pace  is a 76 y.o. male, past medical history of CAD, COPD, hyperlipidemia, hypertension, resents to ED from PCP office for hypotension and generalized body ache, patient reports weight loss around 20 pounds for last few weeks, and loss of appetite, generalized weakness and increased frailty, no oral intake for last 4 days, he denies fever, chills, cough, nausea or vomiting, no diarrhea or constipation, but he does endorse generalized body ache, mainly joint, both knees, wrists and elbows, reported history of gout, but reports current joint pain does not resemble previous gout episodes, patient went to PCP, where he was found to be hypotensive, here received 1 L fluid bolus and instructed to come to ED.  - in ED patient was hypotensive on presentation, he did respond to fluid bolus, work-up significant for sodium of 131, creatinine of 1.78, increased from baseline of 1.09, as well his glucose elevated at 187, CT abdomen pelvis with no acute findings to explain weight loss, CT lumbar spine significant for severe radiculopathy, [facility] hospitalist consulted to admit.   Clinical Impression   Pt agreeable to OT and PT co-evaluation. Pt reports that prior to a month ago he was independent for ADL's and mobility. This date pt required min to mod A for transfer from bed to chair with extended time and labored movement using RW. Pt required min A to don docks seated at EOB with extended time. Pt generally weak with frequent moaning and grimacing from pain. Pt has limited home support and is a high fall risk at this time. Pt reported in the past month he has mostly been bed bound. Pt will benefit from continued OT in the hospital and recommended venue below to increase strength, balance, and endurance for safe ADL's.        Recommendations for follow  up therapy are one component of a multi-disciplinary discharge planning process, led by the attending physician.  Recommendations may be updated based on patient status, additional functional criteria and insurance authorization.   Follow Up Recommendations  Skilled nursing-short term rehab (<3 hours/day)    Assistance Recommended at Discharge Intermittent Supervision/Assistance  Patient can return home with the following A little help with walking and/or transfers;A lot of help with bathing/dressing/bathroom;Assistance with cooking/housework;Assist for transportation;Help with stairs or ramp for entrance    Functional Status Assessment  Patient has had a recent decline in their functional status and demonstrates the ability to make significant improvements in function in a reasonable and predictable amount of time.  Equipment Recommendations  None recommended by OT           Precautions / Restrictions Precautions Precautions: Fall Restrictions Weight Bearing Restrictions: No      Mobility Bed Mobility Overal bed mobility: Needs Assistance Bed Mobility: Supine to Sit     Supine to sit: Supervision     General bed mobility comments: increased time, labored movement, required use of bed rail with HOB flat    Transfers Overall transfer level: Needs assistance Equipment used: Rolling walker (2 wheels) Transfers: Sit to/from Stand, Bed to chair/wheelchair/BSC Sit to Stand: Mod assist, Min assist     Step pivot transfers: Mod assist     General transfer comment: As stated by PT      Balance Overall balance assessment: Needs assistance Sitting-balance support: Feet supported, No upper extremity supported Sitting balance-Leahy Scale: Fair Sitting balance - Comments: fair/good  seated at EOB   Standing balance support: Reliant on assistive device for balance, During functional activity, Bilateral upper extremity supported Standing balance-Leahy Scale: Poor Standing  balance comment: fair/poor using RW                           ADL either performed or assessed with clinical judgement   ADL Overall ADL's : Needs assistance/impaired     Grooming: Minimal assistance;Moderate assistance;Standing           Upper Body Dressing : Set up;Sitting   Lower Body Dressing: Minimal assistance;Sitting/lateral leans Lower Body Dressing Details (indicate cue type and reason): seated at EOB Toilet Transfer: Minimal assistance;Moderate assistance;Stand-pivot;Rolling walker (2 wheels) Toilet Transfer Details (indicate cue type and reason): Simulated via EOB to chair with RW         Functional mobility during ADLs: Minimal assistance;Moderate assistance;Rolling walker (2 wheels)       Vision Baseline Vision/History: 1 Wears glasses Ability to See in Adequate Light: 1 Impaired Patient Visual Report: Blurring of vision (Pt has blury bision during decrease in BP yesterday. Vision has returned to normal.) Vision Assessment?: No apparent visual deficits                Pertinent Vitals/Pain Pain Assessment Pain Assessment: 0-10 Pain Score: 8  Pain Location: low back, bilateral knees Pain Descriptors / Indicators: Sharp, Shooting, Jabbing Pain Intervention(s): Limited activity within patient's tolerance, Monitored during session, Repositioned     Hand Dominance Right   Extremity/Trunk Assessment Upper Extremity Assessment Upper Extremity Assessment: Generalized weakness   Lower Extremity Assessment Lower Extremity Assessment: Defer to PT evaluation   Cervical / Trunk Assessment Cervical / Trunk Assessment: Normal   Communication Communication Communication: No difficulties   Cognition Arousal/Alertness: Awake/alert Behavior During Therapy: WFL for tasks assessed/performed Overall Cognitive Status: Within Functional Limits for tasks assessed                                                        Home  Living Family/patient expects to be discharged to:: Private residence Living Arrangements: Non-relatives/Friends Available Help at Discharge: Friend(s);Available PRN/intermittently Type of Home: House Home Access: Stairs to enter Entrance Stairs-Number of Steps: 2 Entrance Stairs-Rails: None Home Layout: Able to live on main level with bedroom/bathroom;Two level     Bathroom Shower/Tub: Occupational psychologist: Standard Bathroom Accessibility: Yes   Home Equipment: Wheelchair - Publishing copy (2 wheels);Cane - single point (RW and cane via previous admission.)          Prior Functioning/Environment Prior Level of Function : Independent/Modified Independent;Driving             Mobility Comments: assisted tranfers and limited to a few steps using RW, using w/c mostly since since last admission, was [employer], drove ADLs Comments: Independent        OT Problem List: Decreased strength;Decreased range of motion;Decreased activity tolerance;Impaired balance (sitting and/or standing);Pain      OT Treatment/Interventions: Self-care/ADL training;Therapeutic exercise;Patient/family education;Balance training;Therapeutic activities;Energy conservation    OT Goals(Current goals can be found in the care plan section) Acute Rehab OT Goals Patient Stated Goal: Get back to normal. OT Goal Formulation: With patient Time For Goal Achievement: 03/06/21 Potential to Achieve Goals: Fair  OT Frequency: Min 2X/week  Co-evaluation PT/OT/SLP Co-Evaluation/Treatment: Yes Reason for Co-Treatment: To address functional/ADL transfers PT goals addressed during session: Mobility/safety with mobility;Balance;Proper use of DME OT goals addressed during session: ADL's and self-care      AM-PAC OT "6 Clicks" Daily Activity     Outcome Measure Help from another person eating meals?: None Help from another person taking care of personal grooming?: A Lot Help from  another person toileting, which includes using toliet, bedpan, or urinal?: A Little Help from another person bathing (including washing, rinsing, drying)?: A Lot Help from another person to put on and taking off regular upper body clothing?: A Little Help from another person to put on and taking off regular lower body clothing?: A Little 6 Click Score: 17   End of Session Equipment Utilized During Treatment: Rolling walker (2 wheels)  Activity Tolerance: Patient tolerated treatment well Patient left: in chair;with call bell/phone within reach  OT Visit Diagnosis: Unsteadiness on feet (R26.81);Other abnormalities of gait and mobility (R26.89);Muscle weakness (generalized) (M62.81);Adult, failure to thrive (R62.7);Pain Pain - part of body: Knee (and back)                Time: 5520-8022 OT Time Calculation (min): 15 min Charges:  OT General Charges $OT Visit: 1 Visit OT Evaluation $OT Eval Low Complexity: 1 Low  Mishel Sans OT, MOT  Larey Seat 02/20/2021, 12:22 PM

## 2021-02-20 NOTE — Care Management Obs Status (Signed)
[place] NOTIFICATION   Patient Details  Name: Phillip Pace MRN: [id number] Date of Birth: 06-13-1945   Medicare Observation Status Notification Given:  Yes    Tommy Medal 02/20/2021, 1:48 PM

## 2021-02-20 NOTE — Progress Notes (Addendum)
Initial Nutrition Assessment  DOCUMENTATION CODES:   Non-severe (moderate) malnutrition in context of acute illness/injury  INTERVENTION:  Ensure Enlive po BID, each supplement provides 350 kcal and 20 grams of protein   B-complex daily   NUTRITION DIAGNOSIS:   Moderate Malnutrition related to acute illness as evidenced by per patient/family report, energy intake < 75% for > 7 days, mild muscle depletion.   GOAL:  Patient will meet greater than or equal to 90% of their needs  MONITOR:  PO intake, Supplement acceptance, Labs, Weight trends  REASON FOR ASSESSMENT:   Malnutrition Screening Tool    ASSESSMENT: [name] a 76 yo male with hx of CHF, COPD, CAD, HTN and CKD-3. Presents with AKI, dehydration, weakness and hypotension.   At baseline patient says he is lives with girlfriend. He has not been hungry lately and some days will not eat anything but toast.   Provided education about the importance of daily, consistent nutrition intake (food and fluids). Encouraged him to drink high calorie supplement if he is missing meal opportunities to help preserve muscle mass.  His weight down 11% (10 kg) x 1 month per records. Patient reports usual weight around 190 lb (86.3 kg).   Medications reviewed and include: B- complex, Insulin, solumedrol, protonix  IVF- NS@100  ml/hr  Labs: BMP Latest Ref Rng & Units 02/20/2021 02/19/2021 01/24/2021  Glucose 70 - 99 mg/dL 171(H) 187(H) 178(H)  BUN 8 - 23 mg/dL 32(H) 25(H) 25(H)  Creatinine 0.61 - 1.24 mg/dL 1.61(H) 1.78(H) 1.09  Sodium 135 - 145 mmol/L 139 131(L) 129(L)  Potassium 3.5 - 5.1 mmol/L 4.0 3.8 4.0  Chloride 98 - 111 mmol/L 106 98 98  CO2 22 - 32 mmol/L 22 21(L) 20(L)  Calcium 8.9 - 10.3 mg/dL 9.0 8.5(L) 9.1      NUTRITION - FOCUSED PHYSICAL EXAM:  Flowsheet Row Most Recent Value  Orbital Region No depletion  Upper Arm Region Mild depletion  Thoracic and Lumbar Region No depletion  Buccal Region Mild depletion  Temple  Region Mild depletion  Clavicle Bone Region No depletion  Clavicle and Acromion Bone Region Mild depletion  Scapular Bone Region No depletion  Dorsal Hand No depletion  Patellar Region No depletion  Anterior Thigh Region No depletion  Posterior Calf Region No depletion  Edema (RD Assessment) Mild  Hair Reviewed  [balding]  Eyes Reviewed  Mouth Reviewed  Skin Reviewed  Nails Reviewed       Diet Order:   Diet Order             Diet Heart Room service appropriate? Yes; Fluid consistency: Thin  Diet effective now                   EDUCATION NEEDS:  Education needs have been addressed  Skin:  Skin Assessment: Reviewed RN Assessment  Last BM:  1/19 type 5  Height:   Ht Readings from Last 1 Encounters:  02/19/21 6\' 3"  (1.905 m)    Weight:   Wt Readings from Last 1 Encounters:  02/20/21 79.8 kg    Ideal Body Weight:   89 kg  BMI:  Body mass index is 21.99 kg/m.  Estimated Nutritional Needs:   Kcal:  2160-2300  Protein:  88-95 gr  Fluid:  < 2liters daily   Colman Cater MS,RD,CSG,LDN Contact: Shea Evans

## 2021-02-20 NOTE — Plan of Care (Signed)
°  Problem: Acute Rehab OT Goals (only OT should resolve) Goal: Pt. Will Perform Grooming Flowsheets (Taken 02/20/2021 1225) Pt Will Perform Grooming:  with supervision  standing Goal: Pt. Will Perform Lower Body Bathing Flowsheets (Taken 02/20/2021 1225) Pt Will Perform Lower Body Bathing:  with supervision  sitting/lateral leans  with adaptive equipment Goal: Pt. Will Perform Upper Body Dressing Flowsheets (Taken 02/20/2021 1225) Pt Will Perform Upper Body Dressing:  with modified independence  with adaptive equipment  sitting Goal: Pt. Will Perform Lower Body Dressing Flowsheets (Taken 02/20/2021 1225) Pt Will Perform Lower Body Dressing:  with supervision  with adaptive equipment  sitting/lateral leans Goal: Pt. Will Transfer To Toilet Flowsheets (Taken 02/20/2021 1225) Pt Will Transfer to Toilet:  with supervision  stand pivot transfer Goal: Pt/Caregiver Will Perform Home Exercise Program Flowsheets (Taken 02/20/2021 1225) Pt/caregiver will Perform Home Exercise Program:  Increased strength  Both right and left upper extremity  Independently  Marybeth Dandy OT, MOT

## 2021-02-21 DIAGNOSIS — E440 Moderate protein-calorie malnutrition: Secondary | ICD-10-CM

## 2021-02-21 DIAGNOSIS — N179 Acute kidney failure, unspecified: Secondary | ICD-10-CM | POA: Diagnosis not present

## 2021-02-21 DIAGNOSIS — K22719 Barrett's esophagus with dysplasia, unspecified: Secondary | ICD-10-CM

## 2021-02-21 DIAGNOSIS — M2550 Pain in unspecified joint: Secondary | ICD-10-CM | POA: Diagnosis not present

## 2021-02-21 DIAGNOSIS — R5381 Other malaise: Secondary | ICD-10-CM

## 2021-02-21 LAB — BASIC METABOLIC PANEL
Anion gap: 9 (ref 5–15)
BUN: 36 mg/dL — ABNORMAL HIGH (ref 8–23)
CO2: 20 mmol/L — ABNORMAL LOW (ref 22–32)
Calcium: 8.6 mg/dL — ABNORMAL LOW (ref 8.9–10.3)
Chloride: 109 mmol/L (ref 98–111)
Creatinine, Ser: 1.39 mg/dL — ABNORMAL HIGH (ref 0.61–1.24)
GFR, Estimated: 53 mL/min — ABNORMAL LOW (ref >60)
Glucose, Bld: 215 mg/dL — ABNORMAL HIGH (ref 70–99)
Potassium: 3.9 mmol/L (ref 3.5–5.1)
Sodium: 138 mmol/L (ref 135–145)

## 2021-02-21 LAB — GLUCOSE, CAPILLARY
Glucose-Capillary: 142 mg/dL — ABNORMAL HIGH (ref 70–99)
Glucose-Capillary: 182 mg/dL — ABNORMAL HIGH (ref 70–99)

## 2021-02-21 MED ORDER — ENSURE ENLIVE PO LIQD: 237.0000 mL | Freq: Two times a day (BID) | ORAL | Status: DC

## 2021-02-21 MED ORDER — OXYCODONE HCL 5 MG PO TABS: 5.0000 mg | ORAL_TABLET | Freq: Four times a day (QID) | ORAL | 0 refills | Status: DC | PRN

## 2021-02-21 MED ORDER — AMLODIPINE BESYLATE 5 MG PO TABS: 2.5000 mg | ORAL_TABLET | Freq: Every day | ORAL | Status: DC

## 2021-02-21 MED ORDER — PREDNISONE 10 MG (21) PO TBPK: ORAL_TABLET | Freq: Every day | ORAL | Status: DC

## 2021-02-21 MED ORDER — PANTOPRAZOLE SODIUM 40 MG PO TBEC: 40.0000 mg | DELAYED_RELEASE_TABLET | Freq: Two times a day (BID) | ORAL | Status: AC

## 2021-02-21 MED ORDER — ACETAMINOPHEN 325 MG PO TABS: 650.0000 mg | ORAL_TABLET | Freq: Four times a day (QID) | ORAL | Status: AC | PRN

## 2021-02-21 MED ORDER — LISINOPRIL 5 MG PO TABS: 5.0000 mg | ORAL_TABLET | Freq: Every evening | ORAL | Status: DC

## 2021-02-21 MED ORDER — B COMPLEX-C PO TABS: 1.0000 | ORAL_TABLET | Freq: Every day | ORAL | Status: DC

## 2021-02-21 NOTE — TOC Progression Note (Signed)
Transition of Care [facility]) - Progression Note    Patient Details  Name: Phillip Pace MRN: [id number] Date of Birth: 1945-08-18  [facility]) CM/SW Contact  Shade Flood, LCSW Phone Number: 02/21/2021, 12:26 PM  Clinical Narrative:     TOC following. Anticipating dc to SNF today. Updated MD that pt has insurance auth. Updated Debbie at [facility] and they can take pt today. TOC will follow up to complete pt's dc once MD puts orders and dc summary in.  Expected Discharge Plan: [place] Barriers to Discharge: Continued Medical Work up  Expected Discharge Plan and Services Expected Discharge Plan: [place] In-house Referral: Clinical Social Work Discharge Planning Services: CM Consult Post Acute Care Choice: [place] arrangements for the past 2 months: Single Family Home                                       Social Determinants of Health (SDOH) Interventions    Readmission Risk Interventions No flowsheet data found.

## 2021-02-21 NOTE — TOC Transition Note (Signed)
Transition of Care [facility]) - CM/SW Discharge Note   Patient Details  Name: Macalister Arnaud MRN: [id number] Date of Birth: 1945-11-16  Transition of Care [facility]) CM/SW Contact:  Shade Flood, LCSW Phone Number: 02/21/2021, 3:08 PM   Clinical Narrative:     Pt stable for dc per MD. Plan remains for transfer to [facility] for rehab. DC clinical sent electronically. RN to call report. [facility] w/c Lucianne Lei transport arranged.   There are no other TOC needs for dc.  Final next level of care: Skilled Nursing Facility Barriers to Discharge: Barriers Resolved   Patient Goals and CMS Choice Patient states their goals for this hospitalization and ongoing recovery are:: Go to SNF CMS Medicare.gov Compare Post Acute Care list provided to:: Patient Choice offered to / list presented to : Patient  Discharge Placement              Patient chooses bed at: Other - please specify in the comment section below: ([facility]) Patient to be transferred to facility by: [facility] Name of family member notified: pt only Patient and family notified of of transfer: 02/21/21  Discharge Plan and Services In-house Referral: Clinical Social Work Discharge Planning Services: CM Consult Post Acute Care Choice: [place]                               Social Determinants of Health (SDOH) Interventions     Readmission Risk Interventions No flowsheet data found.

## 2021-02-21 NOTE — Discharge Summary (Signed)
Physician Discharge Summary  Phillip Pace ZOX:[id number] DOB: [birth year] DOA: 02/19/2021  PCP: Celene Squibb, MD  Admit date: 02/19/2021 Discharge date: 02/21/2021  Time spent: 35 minutes  Recommendations for Outpatient Follow-up:  Repeat basic metabolic panel to evaluate lites and renal function Reassess blood pressure to further adjust antihypertensive regimen as needed Make sure patient has follow-up with rheumatology service for further evaluation and management Physical therapy and conditioning as per the skilled nursing facility protocol.  Discharge Diagnoses:  Principal Problem:   AKI (acute kidney injury) ([place]) Active Problems:   Failure to thrive (child)   Acute renal failure superimposed on stage 3b chronic kidney disease, unspecified acute renal failure type ([place])   Malnutrition of moderate degree   Physical deconditioning   Discharge Condition: Stable and improved.  Discharged to skilled nursing facility for further care and rehabilitation.  CODE STATUS: Full code.  Diet recommendation: Heart healthy and modified carbohydrates diet.  Filed Weights   02/19/21 2008 02/20/21 0753  Weight: 78.4 kg 79.8 kg    History of present illness:  As per H&P written by Dr. Waldron Labs 02/19/2021 Phillip Pace  is a 76 y.o. male, past medical history of CAD, COPD, hyperlipidemia, hypertension, resents to ED from PCP office for hypotension and generalized body ache, patient reports weight loss around 20 pounds for last few weeks, and loss of appetite, generalized weakness and increased frailty, no oral intake for last 4 days, he denies fever, chills, cough, nausea or vomiting, no diarrhea or constipation, but he does endorse generalized body ache, mainly joint, both knees, wrists and elbows, reported history of gout, but reports current joint pain does not resemble previous gout episodes, patient went to PCP, where he was found to be hypotensive, here received 1 L fluid bolus and  instructed to come to ED. - in ED patient was hypotensive on presentation, he did respond to fluid bolus, work-up significant for sodium of 131, creatinine of 1.78, increased from baseline of 1.09, as well his glucose elevated at 187, CT abdomen pelvis with no acute findings to explain weight loss, CT lumbar spine significant for severe radiculopathy, [facility] hospitalist consulted to admit.  Hospital Course:  1-acute renal failure superimposed on a stage IIIb chronic kidney disease  -In the setting of prerenal azotemia, hypotension, dehydration and continued use of nephrotoxic agents.   -Urinalysis not demonstrating infection -Continue to maintain adequate hydration. -Repeat basic metabolic panel to follow renal function trend/stability. -Continue supportive care.   -Creatinine 1.3 at time of discharge.   2-hypertension: Presented with hypotension -Continue IV fluids -Continue to follow heart healthy diet. -Resuming home antihypertensive agents at this point.   3-hyperlipidemia -Continue statin. -CK/CK total total levels within normal limits.   4-history of coronary disease -Continue the use of statin and aspirin -Resume the use of adjusted dose of metoprolol and lisinopril. -no complaining of chest pain. -EKG and telemetry without acute ischemic changes.   5-gastroesophageal flux disease -Continue PPI twice a day -Lifestyle modification discussed with patient.   6-physical deconditioning, weight loss, generalized weakness, failure to thrive/moderate protein calorie malnutrition and generalized joint pain -With concern for underlying rheumatologic problems -Outpatient follow-up with rheumatology service recommended -CCP antibody negative (less than 20; probation level was 3). -Elevated CRP and ESR (26.9, 135 respectively) -Rheumatoid factor 18.3 -ANA negative -Uric acid 5.4 -Body mass index is 21.99 kg/m. -Ensure twice a day as feeding supplement has been recommended.    7-physical deconditioning -Physical therapy has seen patient with recommendations for rehab at  SNF for safety. -Significant limitation in his ability to perform ADL S at this time. -[name] in agreement to pursuit rehab and social worker is helping with placement. -Patient will be discharge to [facility] skilled nursing facility for further care and rehabilitation.  8-prediabetes -A1c 6.2 -Modify carbohydrate diet recommended -Anticipating elevated CBGs while using the steroids. -Outpatient follow-up with PCP recommended to initiate adequate hypoglycemic regimen.  Procedures: See below for x-ray reports  Consultations: None  Discharge Exam: Vitals:   02/21/21 0507 02/21/21 1406  BP: 126/62 127/76  Pulse: (!) 55 75  Resp: 19   Temp: (!) 97.5 F (36.4 C) 98 F (36.7 C)  SpO2: 96% 99%   General exam: Awake, deconditioned and chronically ill in appearance; denies nausea or vomiting.  Reports improvement in his appetite and also improvement in his joint pain; patient able to participate with physical therapy during today's examination. Respiratory system: Clear to auscultation. Respiratory effort normal.  No requiring oxygen supplementation.  Good saturation on room air. Cardiovascular system: S1 & S2 heard, RRR. No JVD, murmurs, rubs, gallops or clicks. No pedal edema. Gastrointestinal system: Abdomen is nondistended, soft and nontender. No organomegaly or masses felt. Normal bowel sounds heard. Central nervous system: No focal neurological deficits. Extremities: No cyanosis or clubbing. Skin: No rashes, lesions or ulcers Psychiatry: Insight and judgment appears to be more.  Mood & affect appropriate.   Discharge Instructions   Discharge Instructions     Diet - low sodium heart healthy   Complete by: As directed    Discharge instructions   Complete by: As directed    Arrange outpatient follow-up with PCP in 10 days after discharge from skilled nursing facility. Make sure  patient has follow-up with rheumatology provider as previously arranged. Take medications are prescribed Continue adequate hydration Physical therapy/rehabilitation as per the skilled nursing facility protocol.      Allergies as of 02/21/2021       Reactions   Penicillins Shortness Of Breath, Itching, Swelling, Other (See Comments)   Has patient had a PCN reaction causing immediate rash, facial/tongue/throat swelling, SOB or lightheadedness with hypotension: Yes Has patient had a PCN reaction causing severe rash involving mucus membranes or skin necrosis: No Has patient had a PCN reaction that required hospitalization: No Has patient had a PCN reaction occurring within the last 10 years: No If all of the above answers are "NO", then may proceed with Cephalosporin use.   Shellfish Allergy Itching, Swelling        Medication List     STOP taking these medications    HYDROcodone-acetaminophen 5-325 MG tablet Commonly known as: NORCO/VICODIN   methocarbamol 750 MG tablet Commonly known as: ROBAXIN       TAKE these medications    acetaminophen 325 MG tablet Commonly known as: TYLENOL Take 2 tablets (650 mg total) by mouth every 6 (six) hours as needed for mild pain (or Fever >/= 101).   allopurinol 100 MG tablet Commonly known as: ZYLOPRIM Take 100 mg by mouth daily.   amLODipine 5 MG tablet Commonly known as: NORVASC Take 0.5 tablets (2.5 mg total) by mouth daily. What changed: how much to take   aspirin EC 81 MG tablet Take 1 tablet (81 mg total) by mouth daily. Swallow whole.   b complex vitamins capsule Take 1 capsule by mouth daily.   B-complex with vitamin C tablet Take 1 tablet by mouth daily. Start taking on: February 22, 2021   diclofenac Sodium 1 % Gel Commonly  known as: VOLTAREN Apply 2 g topically 4 (four) times daily.   feeding supplement Liqd Take 237 mLs by mouth 2 (two) times daily between meals.   Fish Oil 1000 MG Caps omega-3 acid ethyl  esters 1 gram capsule  TAKE 2 CAPSULES BY MOUTH TWICE DAILY   icosapent Ethyl 1 g capsule Commonly known as: Vascepa Take 2 capsules (2 g total) by mouth 2 (two) times daily.   lisinopril 5 MG tablet Commonly known as: ZESTRIL Take 1 tablet (5 mg total) by mouth every evening. Start taking on: February 27, 2021 What changed: These instructions start on February 27, 2021. If you are unsure what to do until then, ask your doctor or other care provider.   magnesium oxide 400 MG tablet Commonly known as: MAG-OX Take 400 mg by mouth daily.   metoprolol succinate 25 MG 24 hr tablet Commonly known as: TOPROL-XL Take 1 tablet (25 mg total) by mouth daily.   Nitrostat 0.4 MG SL tablet Generic drug: nitroGLYCERIN Place 0.4 mg under the tongue every 5 (five) minutes as needed.   ondansetron 8 MG disintegrating tablet Commonly known as: ZOFRAN-ODT Take 8 mg by mouth 2 (two) times daily as needed for nausea or vomiting.   oxyCODONE 5 MG immediate release tablet Commonly known as: Roxicodone Take 1 tablet (5 mg total) by mouth every 6 (six) hours as needed for severe pain. What changed: when to take this   pantoprazole 40 MG tablet Commonly known as: PROTONIX Take 1 tablet (40 mg total) by mouth 2 (two) times daily. TAKE 1 TABLET(40 MG) BY MOUTH TWICE DAILY BEFORE A MEAL Strength: 40 mg What changed: See the new instructions.   predniSONE 10 MG (21) Tbpk tablet Commonly known as: STERAPRED UNI-PAK 21 TAB Take by mouth daily. Take 6 tabs by mouth daily  for 3 days, then 5 tabs for 2 days, then 4 tabs for 2 days, then 3 tabs for 3 days, then 2 tabs for 2 days, then 1 tab by mouth daily for 3 days and stop prednisone. What changed: additional instructions   rosuvastatin 40 MG tablet Commonly known as: CRESTOR Take 1 tablet (40 mg total) by mouth daily.   tamsulosin 0.4 MG Caps capsule Commonly known as: FLOMAX Take 0.4 mg by mouth daily.   Vitamin D3 125 MCG (5000 UT) Caps Take  5,000 Units by mouth daily.       Allergies  Allergen Reactions   Penicillins Shortness Of Breath, Itching, Swelling and Other (See Comments)    Has patient had a PCN reaction causing immediate rash, facial/tongue/throat swelling, SOB or lightheadedness with hypotension: Yes Has patient had a PCN reaction causing severe rash involving mucus membranes or skin necrosis: No Has patient had a PCN reaction that required hospitalization: No Has patient had a PCN reaction occurring within the last 10 years: No If all of the above answers are "NO", then may proceed with Cephalosporin use.    Shellfish Allergy Itching and Swelling    Contact information for follow-up providers     Celene Squibb, MD. Schedule an appointment as soon as possible for a visit in 10 day(s).   Specialty: Internal Medicine Why: After discharge from the skilled nursing facility. Contact information: [place] [facility] [address] [phone]              Contact information for after-discharge care     [facility] .  Service: Skilled Nursing Contact information: [address] [place] [phone]                    The results of significant diagnostics from this hospitalization (including imaging, microbiology, ancillary and laboratory) are listed below for reference.    Significant Diagnostic Studies: CT ABDOMEN PELVIS WO CONTRAST  Result Date: 02/19/2021 CLINICAL DATA:  Weakness, lower back pain, hypotension, weight loss EXAM: CT ABDOMEN AND PELVIS WITHOUT CONTRAST TECHNIQUE: Multidetector CT imaging of the abdomen and pelvis was performed following the standard protocol without IV contrast. RADIATION DOSE REDUCTION: This exam was performed according to the departmental dose-optimization program which includes automated exposure control, adjustment of the mA and/or kV according to patient size and/or use  of iterative reconstruction technique. COMPARISON:  None. FINDINGS: Lower chest: There is subsegmental atelectasis in the lung bases. Coronary artery calcifications are noted. There is trace pericardial fluid. Hepatobiliary: There is a small calcified granuloma in the left hepatic lobe. The liver is otherwise unremarkable, within the confines of noncontrast technique. The gallbladder is unremarkable. There is no biliary ductal dilatation. Pancreas: Unremarkable. Spleen: Multiple calcified granulomas are seen in the spleen. The spleen is otherwise unremarkable. Adrenals/Urinary Tract: The adrenals are unremarkable. The kidneys are unremarkable, with no focal lesion, stone, hydronephrosis, or hydroureter. The bladder is unremarkable. Stomach/Bowel: The stomach is unremarkable there is no evidence of bowel obstruction. There is no abnormal bowel wall thickening or inflammatory change. The appendix is normal. Vascular/Lymphatic: There is scattered calcified atherosclerotic plaque throughout the nonaneurysmal abdominal aorta. There is no abdominopelvic lymphadenopathy. Reproductive: The prostate and seminal vesicles are unremarkable. Other: There is no ascites or free air. Musculoskeletal: There is no acute osseous abnormality or aggressive osseous lesion. The lumbar spine is assessed in full on the separately dictated CT lumbar spine. IMPRESSION: 1. No acute findings in the abdomen or pelvis. No finding to explain the patient's presentation or weight loss. 2. Coronary artery calcifications and trace pericardial fluid. Electronically Signed   By: Valetta Mole M.D.   On: 02/19/2021 15:26   DG Chest 1 View  Result Date: 01/24/2021 CLINICAL DATA:  Fall EXAM: CHEST  1 VIEW COMPARISON:  Chest radiograph 01/04/2021 FINDINGS: The cardiomediastinal silhouette is normal. There is no focal consolidation or pulmonary edema. There is no pleural effusion or pneumothorax. There is no acute osseous abnormality. IMPRESSION: No  radiographic evidence of acute cardiopulmonary process. Electronically Signed   By: Valetta Mole M.D.   On: 01/24/2021 16:00   DG Pelvis 1-2 Views  Result Date: 01/24/2021 CLINICAL DATA:  Fall EXAM: PELVIS - 1-2 VIEW COMPARISON:  None. FINDINGS: Pubic symphysis and rami are intact. No fracture or malalignment. The SI joints are non widened. IMPRESSION: No definite acute osseous abnormality. Electronically Signed   By: Donavan Foil M.D.   On: 01/24/2021 15:55   CT Head Wo Contrast  Result Date: 01/24/2021 CLINICAL DATA:  Acute neurologic deficit.  Fall 1 week ago. EXAM: CT HEAD WITHOUT CONTRAST TECHNIQUE: Contiguous axial images were obtained from the base of the skull through the vertex without intravenous contrast. COMPARISON:  01/18/2021 FINDINGS: Brain: The brainstem, cerebellum, cerebral peduncles, thalami, basal ganglia, basilar cisterns, and ventricular system appear within normal limits. No intracranial hemorrhage, mass lesion, or acute CVA. Vascular: Unremarkable Skull: Unremarkable Sinuses/Orbits: Chronic left ethmoid and right frontal sinusitis. Other: No supplemental non-categorized findings. IMPRESSION: 1. Mild chronic sinusitis.  No acute intracranial findings. Electronically Signed   By: Cindra Eves.D.  On: 01/24/2021 15:33   CT CHEST WO CONTRAST  Result Date: 02/19/2021 CLINICAL DATA:  Late loss, history of smoking and Barrett's esophagus EXAM: CT CHEST WITHOUT CONTRAST TECHNIQUE: Multidetector CT imaging of the chest was performed following the standard protocol without IV contrast. RADIATION DOSE REDUCTION: This exam was performed according to the departmental dose-optimization program which includes automated exposure control, adjustment of the mA and/or kV according to patient size and/or use of iterative reconstruction technique. COMPARISON:  No prior chest CT. FINDINGS: Cardiovascular: Three-vessel aortic arch. Mild aortic atherosclerosis. The aorta appears to be within  normal limits for size. The heart is normal in size. Small volume pericardial fluid. Severe coronary artery calcifications. Mediastinum/Nodes: Calcified bilateral hilar and mediastinal lymph nodes, likely sequela of prior granulomatous disease. Paraesophageal lymph nodes measure up to 7 mm in short axis (series 2, image 89 and 103). Prominent mediastinal lymph nodes measure up to 6 mm (series 2, image 54), but retain their normal reniform morphology. Evaluation of hilar lymph nodes is somewhat limited in the absence of intravenous contrast. The thyroid gland is unremarkable. The esophagus and trachea demonstrate no significant abnormality. Lungs/Pleura: Apical predominant centrilobular emphysema. Dependent atelectasis. No focal pulmonary opacity. Multiple tiny calcified nodules, likely sequela of prior granulomatous disease. No suspicious pulmonary nodule. Upper Abdomen: Please see same-day CT abdomen pelvis. Musculoskeletal: Degenerative changes in the thoracic spine. No acute osseous abnormality. IMPRESSION: 1. Prominent paraesophageal lymph nodes, which measure up to 7 mm. No other definite lymphadenopathy, although evaluation of hilar lymph nodes is limited by the absence of intravenous contrast. 2. No focal pulmonary opacity or definite neoplasm. 3. Small volume pericardial fluid. 4. Severe coronary artery calcifications. Mild aortic atherosclerosis (ICD10-I70.0). 5. For findings in the abdomen please see same-day CT abdomen pelvis. Electronically Signed   By: Merilyn Baba M.D.   On: 02/19/2021 17:17   [place] LIMITED JOINT SPACE STRUCTURES LOW LEFT  Result Date: 02/20/2021 CLINICAL DATA:  LEFT knee joint effusion, question gout EXAM: ULTRASOUND LEFT LOWER EXTREMITY LIMITED TECHNIQUE: Ultrasound examination of the lower extremity soft tissues was performed in the area of clinical concern, the LEFT knee joint. COMPARISON:  LEFT knee radiographs 02/19/2021 FINDINGS: Minimal joint effusion at suprapatellar recess.  No additional joint fluid identified. No adequate pocket of fluid is seen to allow for arthrocentesis. IMPRESSION: Minimal joint effusion, insufficient for arthrocentesis. Electronically Signed   By: Lavonia Dana M.D.   On: 02/20/2021 11:29   CT L-SPINE NO CHARGE  Result Date: 02/19/2021 CLINICAL DATA:  Low back pain, weight loss weakness, hypotension EXAM: CT LUMBAR SPINE WITHOUT CONTRAST TECHNIQUE: Multidetector CT imaging of the lumbar spine was performed without intravenous contrast administration. Multiplanar CT image reconstructions were also generated. RADIATION DOSE REDUCTION: This exam was performed according to the departmental dose-optimization program which includes automated exposure control, adjustment of the mA and/or kV according to patient size and/or use of iterative reconstruction technique. COMPARISON:  Lumbar spine radiographs 08/27/2014 FINDINGS: Segmentation: Standard; the lowest formed disc space is designated L5-S1. Alignment: There is trace retrolisthesis of L1 on L2 and L2 on L3, not significantly changed since 2016. There is mild dextrocurvature centered at L2-L3. Vertebrae: Vertebral body heights are preserved. There is no evidence of acute injury. Densely sclerotic lesions in the L1 and L4 vertebral bodies most likely reflect bone islands (average and max attenuations at L1 are 938 and 1,409 Hounsfield units, respectively, and 968 and 1,268 Hounsfield units at L4, respectively. There is a prominent Schmorl's node indenting the superior L3  endplate. There is a lucent lesion without aggressive features in the right iliac bone. There are no suspicious osseous lesions. Paraspinal and other soft tissues: The paraspinal soft tissues are unremarkable. The abdominal and pelvic viscera are assessed on the separately dictated CT abdomen/pelvis. Disc levels: There is marked intervertebral disc space narrowing at L2-L3. There is more mild disc space narrowing at the remaining levels. There is  vacuum disc phenomenon at L5-S1. T12-L1: No significant spinal canal or neural foraminal stenosis. L1-L2: Mild degenerative endplate change and facet arthropathy without significant spinal canal or neural foraminal stenosis. L2-L3: There is degenerative endplate change and mild bilateral facet arthropathy with grade 1 retrolisthesis resulting in mild spinal canal stenosis without significant neural foraminal stenosis. L3-L4: There is a mild disc bulge and mild bilateral facet arthropathy without significant spinal canal or neural foraminal stenosis. L4-L5: There is a mild disc bulge, ligamentum flavum thickening, and moderate right worse than left facet arthropathy resulting in mild spinal canal stenosis without significant neural foraminal stenosis. L5-S1: There is a mild disc bulge and moderate right worse than left facet arthropathy without significant spinal canal or neural foraminal stenosis. IMPRESSION: 1. No evidence of acute injury in the lumbar spine. 2. Grade 1 retrolisthesis of L1 on L2 and L2 on L3, not significantly changed since 2016. Mild dextrocurvature centered at L2-L3. 3. Marked intervertebral disc space narrowing at L2-L3. 4. Multilevel facet arthropathy, most advanced on the right at L4-L5 and L5-S1. 5. No high-grade spinal canal or neural foraminal stenosis. 6.  Aortic Atherosclerosis (ICD10-I70.0). Electronically Signed   By: Valetta Mole M.D.   On: 02/19/2021 15:34   DG Chest Port 1 View  Result Date: 02/19/2021 CLINICAL DATA:  Short of breath and weakness EXAM: PORTABLE CHEST 1 VIEW COMPARISON:  01/24/2021 FINDINGS: The heart size and mediastinal contours are within normal limits. Both lungs are clear. The visualized skeletal structures are unremarkable. IMPRESSION: No active disease. Electronically Signed   By: Franchot Gallo M.D.   On: 02/19/2021 14:57   DG Knee 3 Views Left  Result Date: 02/19/2021 CLINICAL DATA:  Left knee pain. EXAM: LEFT KNEE - 3 VIEW COMPARISON:  01/18/2021  FINDINGS: There appears to be resolution of the prior moderate knee joint effusion. Mild superior patellar degenerative osteophytosis, unchanged. Mild medial and lateral patellar osteophytosis. The medial and lateral compartment joint spaces are maintained. Possible minimal patellofemoral joint space narrowing. No acute fracture or dislocation. IMPRESSION:: IMPRESSION: 1. Resolution of the prior joint effusion. 2. Mild patellofemoral osteoarthritis. Electronically Signed   By: Yvonne Kendall M.D.   On: 02/19/2021 16:50    Microbiology: Recent Results (from the past 240 hour(s))  Resp Panel by RT-PCR (Flu A&B, Covid) Nasopharyngeal Swab     Status: None   Collection Time: 02/19/21  7:49 PM   Specimen: Nasopharyngeal Swab; Nasopharyngeal(NP) swabs in vial transport medium  Result Value Ref Range Status   SARS Coronavirus 2 by RT PCR NEGATIVE NEGATIVE Final    Comment: (NOTE) SARS-CoV-2 target nucleic acids are NOT DETECTED.  The SARS-CoV-2 RNA is generally detectable in upper respiratory specimens during the acute phase of infection. The lowest concentration of SARS-CoV-2 viral copies this assay can detect is 138 copies/mL. A negative result does not preclude SARS-Cov-2 infection and should not be used as the sole basis for treatment or other patient management decisions. A negative result may occur with  improper specimen collection/handling, submission of specimen other than nasopharyngeal swab, presence of viral mutation(s) within the areas targeted by  this assay, and inadequate number of viral copies(<138 copies/mL). A negative result must be combined with clinical observations, patient history, and epidemiological information. The expected result is Negative.  Fact Sheet for Patients:  EntrepreneurPulse.com.au  Fact Sheet for Healthcare Providers:  IncredibleEmployment.be  This test is no t yet approved or cleared by the [place] FDA and  has  been authorized for detection and/or diagnosis of SARS-CoV-2 by FDA under an Emergency Use Authorization (EUA). This EUA will remain  in effect (meaning this test can be used) for the duration of the COVID-19 declaration under Section 564(b)(1) of the Act, 21 U.S.C.section 360bbb-3(b)(1), unless the authorization is terminated  or revoked sooner.       Influenza A by PCR NEGATIVE NEGATIVE Final   Influenza B by PCR NEGATIVE NEGATIVE Final    Comment: (NOTE) The Xpert Xpress SARS-CoV-2/FLU/RSV plus assay is intended as an aid in the diagnosis of influenza from Nasopharyngeal swab specimens and should not be used as a sole basis for treatment. Nasal washings and aspirates are unacceptable for Xpert Xpress SARS-CoV-2/FLU/RSV testing.  Fact Sheet for Patients: EntrepreneurPulse.com.au  Fact Sheet for Healthcare Providers: IncredibleEmployment.be  This test is not yet approved or cleared by the [place] FDA and has been authorized for detection and/or diagnosis of SARS-CoV-2 by FDA under an Emergency Use Authorization (EUA). This EUA will remain in effect (meaning this test can be used) for the duration of the COVID-19 declaration under Section 564(b)(1) of the Act, 21 U.S.C. section 360bbb-3(b)(1), unless the authorization is terminated or revoked.  Performed at [facility], [address] [place], [place] [address]      Labs: Basic Metabolic Panel: Recent Labs  Lab 02/19/21 1439 02/20/21 0521 02/21/21 0939  NA 131* 139 138  K 3.8 4.0 3.9  CL 98 106 109  CO2 21* 22 20*  GLUCOSE 187* 171* 215*  BUN 25* 32* 36*  CREATININE 1.78* 1.61* 1.39*  CALCIUM 8.5* 9.0 8.6*   Liver Function Tests: Recent Labs  Lab 02/19/21 1439 02/20/21 0521  AST 24 26  ALT 27 27  ALKPHOS 139* 120  BILITOT 0.8 0.8  PROT 6.9 6.7  ALBUMIN 2.6* 2.3*   CBC: Recent Labs  Lab 02/19/21 1439 02/20/21 0521  WBC 9.2 6.1  NEUTROABS 8.3*  --   HGB  9.1* 9.1*  HCT 28.7* 29.4*  MCV 84.7 82.8  PLT 169 187   Cardiac Enzymes: Recent Labs  Lab 02/19/21 1609  CKTOTAL 13*  CKMB 0.7   CBG: Recent Labs  Lab 02/20/21 1136 02/20/21 1600 02/20/21 2144 02/21/21 0720 02/21/21 1119  GLUCAP 203* 189* 129* 142* 182*    Signed:  Barton Dubois MD.  [facility] 02/21/2021, 2:26 PM

## 2021-02-21 NOTE — TOC Progression Note (Signed)
Pts insurance Josem Kaufmann has been approved 1/20 - 1/24, next review date 1/24, navi auth id# [id number], plan auth id # [id number]

## 2021-02-24 ENCOUNTER — Ambulatory Visit (HOSPITAL_COMMUNITY): Admission: RE | Admit: 2021-02-24 | Payer: Medicare HMO | Source: Ambulatory Visit

## 2021-02-24 ENCOUNTER — Telehealth: Payer: Self-pay | Admitting: Cardiology

## 2021-02-24 NOTE — Telephone Encounter (Signed)
I reached out to [facility] for this pt medical records to be faxed over. I had to leave voicemail with the medical record team at the cardio facility.  Pt told me that was the office and, the doctors name was Dr. Harlow Mares.  Malanie

## 2021-03-13 ENCOUNTER — Ambulatory Visit: Payer: Self-pay

## 2021-03-13 NOTE — Telephone Encounter (Signed)
° °  Chief Complaint: Weakness Symptoms: Weakness Frequency: Started Pertinent Negatives: Patient denies chest pain or SOB Disposition: [x] ED /[] Urgent Care (no appt availability in office) / [] Appointment(In office/virtual)/ []  [place] [facility]/ [] Home Care/ [] Refused Recommended Disposition /[] [place] [facility]/ []  Follow-up with PCP Additional Notes:    Reason for Disposition  Patient sounds very sick or weak to the triager  Answer Assessment - Initial Assessment Questions 1. DESCRIPTION: "Describe how you are feeling."     Weak  2. SEVERITY: "How bad is it?"  "Can you stand and walk?"   - MILD - Feels weak or tired, but does not interfere with work, school or normal activities   - [place] to stand and walk; weakness interferes with work, school, or normal activities   - SEVERE - Unable to stand or walk     Severe 3. ONSET:  "When did the weakness begin?"     August 4. CAUSE: "What do you think is causing the weakness?"     Unsure 5. MEDICINES: "Have you recently started a new medicine or had a change in the amount of a medicine?"     No 6. OTHER SYMPTOMS: "Do you have any other symptoms?" (e.g., chest pain, fever, cough, SOB, vomiting, diarrhea, bleeding, other areas of pain)     No 7. PREGNANCY: "Is there any chance you are pregnant?" "When was your last menstrual period?"     N/a  Protocols used: Weakness (Generalized) and Fatigue-A-AH

## 2021-03-16 ENCOUNTER — Encounter (HOSPITAL_COMMUNITY): Payer: Self-pay | Admitting: Emergency Medicine

## 2021-03-16 ENCOUNTER — Observation Stay (HOSPITAL_COMMUNITY)
Admission: EM | Admit: 2021-03-16 | Discharge: 2021-03-18 | Disposition: A | Discharge status: Home / Self Care | Payer: Medicare HMO | Attending: Internal Medicine | Admitting: Internal Medicine

## 2021-03-16 ENCOUNTER — Other Ambulatory Visit: Payer: Self-pay

## 2021-03-16 DIAGNOSIS — N1831 Chronic kidney disease, stage 3a: Secondary | ICD-10-CM | POA: Diagnosis not present

## 2021-03-16 DIAGNOSIS — R34 Anuria and oliguria: Secondary | ICD-10-CM | POA: Insufficient documentation

## 2021-03-16 DIAGNOSIS — Z79899 Other long term (current) drug therapy: Secondary | ICD-10-CM | POA: Diagnosis not present

## 2021-03-16 DIAGNOSIS — M25529 Pain in unspecified elbow: Secondary | ICD-10-CM | POA: Insufficient documentation

## 2021-03-16 DIAGNOSIS — I2510 Atherosclerotic heart disease of native coronary artery without angina pectoris: Secondary | ICD-10-CM | POA: Diagnosis present

## 2021-03-16 DIAGNOSIS — E860 Dehydration: Secondary | ICD-10-CM

## 2021-03-16 DIAGNOSIS — E871 Hypo-osmolality and hyponatremia: Secondary | ICD-10-CM | POA: Diagnosis present

## 2021-03-16 DIAGNOSIS — R739 Hyperglycemia, unspecified: Secondary | ICD-10-CM | POA: Diagnosis present

## 2021-03-16 DIAGNOSIS — Z7982 Long term (current) use of aspirin: Secondary | ICD-10-CM | POA: Insufficient documentation

## 2021-03-16 DIAGNOSIS — R000 Tachycardia, unspecified: Secondary | ICD-10-CM | POA: Diagnosis not present

## 2021-03-16 DIAGNOSIS — J449 Chronic obstructive pulmonary disease, unspecified: Secondary | ICD-10-CM | POA: Diagnosis not present

## 2021-03-16 DIAGNOSIS — M25539 Pain in unspecified wrist: Secondary | ICD-10-CM | POA: Insufficient documentation

## 2021-03-16 DIAGNOSIS — Z87891 Personal history of nicotine dependence: Secondary | ICD-10-CM | POA: Diagnosis not present

## 2021-03-16 DIAGNOSIS — N179 Acute kidney failure, unspecified: Secondary | ICD-10-CM | POA: Diagnosis not present

## 2021-03-16 DIAGNOSIS — M2540 Effusion, unspecified joint: Secondary | ICD-10-CM | POA: Diagnosis not present

## 2021-03-16 DIAGNOSIS — I130 Hypertensive heart and chronic kidney disease with heart failure and stage 1 through stage 4 chronic kidney disease, or unspecified chronic kidney disease: Secondary | ICD-10-CM | POA: Insufficient documentation

## 2021-03-16 DIAGNOSIS — I5042 Chronic combined systolic (congestive) and diastolic (congestive) heart failure: Secondary | ICD-10-CM | POA: Diagnosis present

## 2021-03-16 DIAGNOSIS — M359 Systemic involvement of connective tissue, unspecified: Secondary | ICD-10-CM | POA: Diagnosis not present

## 2021-03-16 DIAGNOSIS — R531 Weakness: Secondary | ICD-10-CM

## 2021-03-16 DIAGNOSIS — R1310 Dysphagia, unspecified: Secondary | ICD-10-CM

## 2021-03-16 DIAGNOSIS — Z20822 Contact with and (suspected) exposure to covid-19: Secondary | ICD-10-CM | POA: Diagnosis not present

## 2021-03-16 DIAGNOSIS — L8991 Pressure ulcer of unspecified site, stage 1: Secondary | ICD-10-CM

## 2021-03-16 DIAGNOSIS — M2550 Pain in unspecified joint: Secondary | ICD-10-CM | POA: Diagnosis present

## 2021-03-16 DIAGNOSIS — M259 Joint disorder, unspecified: Secondary | ICD-10-CM | POA: Diagnosis present

## 2021-03-16 DIAGNOSIS — Z7984 Long term (current) use of oral hypoglycemic drugs: Secondary | ICD-10-CM | POA: Diagnosis not present

## 2021-03-16 DIAGNOSIS — M25569 Pain in unspecified knee: Secondary | ICD-10-CM | POA: Diagnosis not present

## 2021-03-16 DIAGNOSIS — L8990 Pressure ulcer of unspecified site, unspecified stage: Secondary | ICD-10-CM | POA: Insufficient documentation

## 2021-03-16 DIAGNOSIS — I10 Essential (primary) hypertension: Secondary | ICD-10-CM

## 2021-03-16 HISTORY — DX: Benign prostatic hyperplasia without lower urinary tract symptoms: N40.0

## 2021-03-16 HISTORY — DX: Gout, unspecified: M10.9

## 2021-03-16 HISTORY — DX: Chronic combined systolic (congestive) and diastolic (congestive) heart failure: I50.42

## 2021-03-16 LAB — COMPREHENSIVE METABOLIC PANEL
ALT: 24 U/L (ref 0–44)
AST: 29 U/L (ref 15–41)
Albumin: 2.4 g/dL — ABNORMAL LOW (ref 3.5–5.0)
Alkaline Phosphatase: 111 U/L (ref 38–126)
Anion gap: 15 (ref 5–15)
BUN: 23 mg/dL (ref 8–23)
CO2: 19 mmol/L — ABNORMAL LOW (ref 22–32)
Calcium: 9.2 mg/dL (ref 8.9–10.3)
Chloride: 91 mmol/L — ABNORMAL LOW (ref 98–111)
Creatinine, Ser: 1.35 mg/dL — ABNORMAL HIGH (ref 0.61–1.24)
GFR, Estimated: 55 mL/min — ABNORMAL LOW (ref >60)
Glucose, Bld: 137 mg/dL — ABNORMAL HIGH (ref 70–99)
Potassium: 4.1 mmol/L (ref 3.5–5.1)
Sodium: 125 mmol/L — ABNORMAL LOW (ref 135–145)
Total Bilirubin: 0.7 mg/dL (ref 0.3–1.2)
Total Protein: 7.9 g/dL (ref 6.5–8.1)

## 2021-03-16 LAB — CBC WITH DIFFERENTIAL/PLATELET
Abs Immature Granulocytes: 0.05 10*3/uL (ref 0.00–0.07)
Basophils Absolute: 0 10*3/uL (ref 0.0–0.1)
Basophils Relative: 0 %
Eosinophils Absolute: 0.1 10*3/uL (ref 0.0–0.5)
Eosinophils Relative: 1 %
HCT: 31.6 % — ABNORMAL LOW (ref 39.0–52.0)
Hemoglobin: 10.1 g/dL — ABNORMAL LOW (ref 13.0–17.0)
Immature Granulocytes: 1 %
Lymphocytes Relative: 11 %
Lymphs Abs: 0.9 10*3/uL (ref 0.7–4.0)
MCH: 26.1 pg (ref 26.0–34.0)
MCHC: 32 g/dL (ref 30.0–36.0)
MCV: 81.7 fL (ref 80.0–100.0)
Monocytes Absolute: 0.5 10*3/uL (ref 0.1–1.0)
Monocytes Relative: 6 %
Neutro Abs: 6 10*3/uL (ref 1.7–7.7)
Neutrophils Relative %: 81 %
Platelets: 480 10*3/uL — ABNORMAL HIGH (ref 150–400)
RBC: 3.87 MIL/uL — ABNORMAL LOW (ref 4.22–5.81)
RDW: 17 % — ABNORMAL HIGH (ref 11.5–15.5)
WBC: 7.4 10*3/uL (ref 4.0–10.5)
nRBC: 0 % (ref 0.0–0.2)

## 2021-03-16 LAB — MAGNESIUM: Magnesium: 1.7 mg/dL (ref 1.7–2.4)

## 2021-03-16 LAB — BRAIN NATRIURETIC PEPTIDE: B Natriuretic Peptide: 42 pg/mL (ref 0.0–100.0)

## 2021-03-16 LAB — SEDIMENTATION RATE: Sed Rate: 137 mm/hr — ABNORMAL HIGH (ref 0–16)

## 2021-03-16 LAB — CK: Total CK: 40 U/L — ABNORMAL LOW (ref 49–397)

## 2021-03-16 LAB — PHOSPHORUS: Phosphorus: 3.4 mg/dL (ref 2.5–4.6)

## 2021-03-16 MED ORDER — TRAMADOL HCL 50 MG PO TABS: 100.0000 mg | ORAL_TABLET | Freq: Four times a day (QID) | ORAL | Status: DC | PRN

## 2021-03-16 MED ORDER — METHYLPREDNISOLONE SODIUM SUCC 125 MG IJ SOLR
125.0000 mg | Freq: Once | INTRAMUSCULAR | Status: AC
Start: 2021-03-16 — End: 2021-03-16
  Administered 2021-03-16: 125 mg via INTRAVENOUS
  Filled 2021-03-16: qty 2

## 2021-03-16 MED ORDER — LISINOPRIL 5 MG PO TABS
5.0000 mg | ORAL_TABLET | Freq: Every day | ORAL | Status: DC
  Administered 2021-03-17: 5 mg via ORAL
  Filled 2021-03-16: qty 1

## 2021-03-16 MED ORDER — ENOXAPARIN SODIUM 40 MG/0.4ML IJ SOSY
40.0000 mg | PREFILLED_SYRINGE | INTRAMUSCULAR | Status: DC
  Administered 2021-03-16 – 2021-03-17 (×2): 40 mg via SUBCUTANEOUS
  Filled 2021-03-16 (×2): qty 0.4

## 2021-03-16 MED ORDER — ONDANSETRON HCL 4 MG/2ML IJ SOLN
4.0000 mg | INTRAMUSCULAR | Status: AC
  Administered 2021-03-16: 4 mg via INTRAVENOUS
  Filled 2021-03-16: qty 2

## 2021-03-16 MED ORDER — HYDROMORPHONE HCL 1 MG/ML IJ SOLN
1.0000 mg | Freq: Once | INTRAMUSCULAR | Status: AC
Start: 2021-03-16 — End: 2021-03-16
  Administered 2021-03-16: 1 mg via INTRAVENOUS
  Filled 2021-03-16: qty 1

## 2021-03-16 MED ORDER — PANTOPRAZOLE SODIUM 40 MG PO TBEC
40.0000 mg | DELAYED_RELEASE_TABLET | Freq: Two times a day (BID) | ORAL | Status: DC
  Administered 2021-03-16 – 2021-03-18 (×4): 40 mg via ORAL
  Filled 2021-03-16 (×4): qty 1

## 2021-03-16 MED ORDER — PREDNISONE 20 MG PO TABS
50.0000 mg | ORAL_TABLET | Freq: Every day | ORAL | Status: DC
  Administered 2021-03-17 – 2021-03-18 (×2): 50 mg via ORAL
  Filled 2021-03-16 (×2): qty 1

## 2021-03-16 MED ORDER — ASPIRIN 81 MG PO CHEW
81.0000 mg | CHEWABLE_TABLET | Freq: Every day | ORAL | Status: DC
  Administered 2021-03-17 – 2021-03-18 (×2): 81 mg via ORAL
  Filled 2021-03-16 (×2): qty 1

## 2021-03-16 MED ORDER — CELECOXIB 100 MG PO CAPS
100.0000 mg | ORAL_CAPSULE | Freq: Two times a day (BID) | ORAL | Status: DC
  Administered 2021-03-16 – 2021-03-17 (×2): 100 mg via ORAL
  Filled 2021-03-16 (×2): qty 1

## 2021-03-16 MED ORDER — ENSURE ENLIVE PO LIQD
237.0000 mL | Freq: Two times a day (BID) | ORAL | Status: DC
  Administered 2021-03-17 – 2021-03-18 (×3): 237 mL via ORAL
  Filled 2021-03-16 (×2): qty 237

## 2021-03-16 MED ORDER — HYDROCODONE-ACETAMINOPHEN 10-325 MG PO TABS: 1.0000 | ORAL_TABLET | Freq: Four times a day (QID) | ORAL | Status: DC | PRN

## 2021-03-16 MED ORDER — TAMSULOSIN HCL 0.4 MG PO CAPS
0.4000 mg | ORAL_CAPSULE | Freq: Every day | ORAL | Status: DC
  Administered 2021-03-17 – 2021-03-18 (×2): 0.4 mg via ORAL
  Filled 2021-03-16 (×2): qty 1

## 2021-03-16 MED ORDER — METOPROLOL SUCCINATE ER 25 MG PO TB24
25.0000 mg | ORAL_TABLET | Freq: Every day | ORAL | Status: DC
  Administered 2021-03-17 – 2021-03-18 (×2): 25 mg via ORAL
  Filled 2021-03-16 (×2): qty 1

## 2021-03-16 MED ORDER — ONDANSETRON 4 MG PO TBDP: 4.0000 mg | ORAL_TABLET | Freq: Three times a day (TID) | ORAL | Status: DC | PRN

## 2021-03-16 MED ORDER — ROSUVASTATIN CALCIUM 20 MG PO TABS
40.0000 mg | ORAL_TABLET | Freq: Every day | ORAL | Status: DC
  Administered 2021-03-17 – 2021-03-18 (×2): 40 mg via ORAL
  Filled 2021-03-16 (×2): qty 2

## 2021-03-16 MED ORDER — ALLOPURINOL 100 MG PO TABS
100.0000 mg | ORAL_TABLET | Freq: Every day | ORAL | Status: DC
  Administered 2021-03-17 – 2021-03-18 (×2): 100 mg via ORAL
  Filled 2021-03-16 (×2): qty 1

## 2021-03-16 MED ORDER — SODIUM CHLORIDE 0.9 % IV SOLN: INTRAVENOUS | Status: DC

## 2021-03-16 MED ORDER — SODIUM CHLORIDE 0.9 % IV BOLUS
1000.0000 mL | Freq: Once | INTRAVENOUS | Status: AC
  Administered 2021-03-16: 1000 mL via INTRAVENOUS

## 2021-03-16 NOTE — ED Provider Notes (Signed)
[facility] Provider Note   CSN: [id number] Arrival date & time: 03/16/21  1514     History  Chief Complaint  Patient presents with   Joint Pain    Phillip Pace is a 76 y.o. male.  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  He states that over the last couple of years she has struggled with intermittent flareups of diffuse joint pains.  He has had approximately 6 visits to the emergency department and has been in and out of nursing for facilities and rehabilitation over the last 6 months.  Most recently the patient was admitted in January, he was sent to a rehab, he was able to finally ambulate with a walker than a cane, then as they were getting ready to discharge and his pain got worse, they discharged him anyway.  He went back to his family doctor's office and has had more work-ups at that time.  He has not been seen by rheumatology to the best of his knowledge.  The patient does not know the names of any of his medications, he arrives without his bag of medications.  Paramedics do report prehospital that the patient was hypotensive and tachycardic.  The patient has been vomiting today.  He feels very dehydrated.  He has not been able to get out of bed to any degree in the last several days  Home Medications Prior to Admission medications   Medication Sig Start Date End Date Taking? Authorizing Provider  acetaminophen (TYLENOL) 325 MG tablet Take 2 tablets (650 mg total) by mouth every 6 (six) hours as needed for mild pain (or Fever >/= 101). 02/21/21   Barton Dubois, MD  allopurinol (ZYLOPRIM) 100 MG tablet Take 100 mg by mouth daily.    [provider]  amLODipine (NORVASC) 5 MG tablet Take 0.5 tablets (2.5 mg total) by mouth daily. 02/21/21   Barton Dubois, MD  aspirin EC 81 MG tablet Take 1 tablet (81 mg total) by mouth  daily. Swallow whole. 11/06/20   Sueanne Margarita, MD  b complex vitamins capsule Take 1 capsule by mouth daily.    [provider]  B Complex-C (B-COMPLEX WITH VITAMIN C) tablet Take 1 tablet by mouth daily. 02/22/21   Barton Dubois, MD  Cholecalciferol (VITAMIN D3) 5000 units CAPS Take 5,000 Units by mouth daily.    [provider]  diclofenac Sodium (VOLTAREN) 1 % GEL Apply 2 g topically 4 (four) times daily.    [provider]  feeding supplement (ENSURE ENLIVE / ENSURE PLUS) LIQD Take 237 mLs by mouth 2 (two) times daily between meals. 02/21/21   Barton Dubois, MD  icosapent Ethyl (VASCEPA) 1 g capsule Take 2 capsules (2 g total) by mouth 2 (two) times daily. 11/13/20   Sueanne Margarita, MD  lisinopril (ZESTRIL) 5 MG tablet Take 1 tablet (5 mg total) by mouth every evening. 02/27/21   Barton Dubois, MD  magnesium oxide (MAG-OX) 400 MG tablet Take 400 mg by mouth daily.    [provider]  metoprolol succinate (TOPROL-XL) 25 MG 24 hr tablet Take 1 tablet (25 mg total) by mouth daily. 11/06/20   Turner, Eber Hong, MD  NITROSTAT 0.4 MG SL tablet Place 0.4 mg under the tongue every 5 (five) minutes as needed. 07/06/14   [provider]  Omega-3 Fatty Acids ([place])  1000 MG CAPS omega-3 acid ethyl esters 1 gram capsule  TAKE 2 CAPSULES BY MOUTH TWICE DAILY    [provider]  ondansetron (ZOFRAN-ODT) 8 MG disintegrating tablet Take 8 mg by mouth 2 (two) times daily as needed for nausea or vomiting.    [provider]  oxyCODONE (ROXICODONE) 5 MG immediate release tablet Take 1 tablet (5 mg total) by mouth every 6 (six) hours as needed for severe pain. 02/21/21   Barton Dubois, MD  pantoprazole (PROTONIX) 40 MG tablet Take 1 tablet (40 mg total) by mouth 2 (two) times daily. TAKE 1 TABLET(40 MG) BY MOUTH TWICE DAILY BEFORE A MEAL Strength: 40 mg 02/21/21   Barton Dubois, MD  predniSONE (STERAPRED UNI-PAK 21 TAB) 10 MG (21) TBPK tablet Take by  mouth daily. Take 6 tabs by mouth daily  for 3 days, then 5 tabs for 2 days, then 4 tabs for 2 days, then 3 tabs for 3 days, then 2 tabs for 2 days, then 1 tab by mouth daily for 3 days and stop prednisone. 02/21/21   Barton Dubois, MD  rosuvastatin (CRESTOR) 40 MG tablet Take 1 tablet (40 mg total) by mouth daily. 11/13/20 02/19/21  Sueanne Margarita, MD  tamsulosin (FLOMAX) 0.4 MG CAPS capsule Take 0.4 mg by mouth daily.  11/25/15   [provider]      Allergies    Penicillins and Shellfish allergy    Review of Systems   Review of Systems  All other systems reviewed and are negative.  Physical Exam Updated Vital Signs BP 110/66    Pulse (!) 111    Temp 98.5 F (36.9 C) (Oral)    Resp 20    Ht 1.905 m (6\' 3" )    Wt 79.8 kg    SpO2 90%    BMI 21.99 kg/m  Physical Exam Vitals and nursing note reviewed.  Constitutional:      General: He is not in acute distress.    Appearance: He is well-developed.  HENT:     Head: Normocephalic and atraumatic.     Mouth/Throat:     Mouth: Mucous membranes are dry.     Pharynx: No oropharyngeal exudate.  Eyes:     General: No scleral icterus.       Right eye: No discharge.        Left eye: No discharge.     Conjunctiva/sclera: Conjunctivae normal.     Pupils: Pupils are equal, round, and reactive to light.  Neck:     Thyroid: No thyromegaly.     Vascular: No JVD.  Cardiovascular:     Rate and Rhythm: Regular rhythm. Tachycardia present.     Heart sounds: Normal heart sounds. No murmur heard.   No friction rub. No gallop.  Pulmonary:     Effort: Pulmonary effort is normal. No respiratory distress.     Breath sounds: Normal breath sounds. No wheezing or rales.  Abdominal:     General: Bowel sounds are normal. There is no distension.     Palpations: Abdomen is soft. There is no mass.     Tenderness: There is no abdominal tenderness.  Musculoskeletal:        General: Swelling and tenderness present. Normal range of motion.      Cervical back: Normal range of motion and neck supple.     Right lower leg: No edema.     Left lower leg: No edema.     Comments: Tenderness and mild swelling diffusely  of joints.  Nothing is more swollen than another, there is no large joint effusions.  Lymphadenopathy:     Cervical: No cervical adenopathy.  Skin:    General: Skin is warm and dry.     Findings: No erythema or rash.  Neurological:     Mental Status: He is alert.     Coordination: Coordination normal.     Comments: Able to lift both arms and both legs, grips are weak secondary to pain in the hands and fingers, cranial nerves III through XII are normal, speech is normal  Psychiatric:        Behavior: Behavior normal.    ED Results / Procedures / Treatments   Labs (all labs ordered are listed, but only abnormal results are displayed) Labs Reviewed  CBC WITH DIFFERENTIAL/PLATELET - Abnormal; Notable for the following components:      Result Value   RBC 3.87 (*)    Hemoglobin 10.1 (*)    HCT 31.6 (*)    RDW 17.0 (*)    Platelets 480 (*)    All other components within normal limits  COMPREHENSIVE METABOLIC PANEL - Abnormal; Notable for the following components:   Sodium 125 (*)    Chloride 91 (*)    CO2 19 (*)    Glucose, Bld 137 (*)    Creatinine, Ser 1.35 (*)    Albumin 2.4 (*)    GFR, Estimated 55 (*)    All other components within normal limits  CK - Abnormal; Notable for the following components:   Total CK 40 (*)    All other components within normal limits  MAGNESIUM  PHOSPHORUS  SEDIMENTATION RATE  C-REACTIVE PROTEIN  URINALYSIS, ROUTINE W REFLEX MICROSCOPIC  LYME DISEASE DNA BY PCR(BORRELIA BURG)  ROCKY MTN SPOTTED FVR ABS PNL(IGG+IGM)    EKG EKG Interpretation  Date/Time:  Sunday March 16 2021 15:39:06 EST Ventricular Rate:  132 PR Interval:  127 QRS Duration: 93 QT Interval:  314 QTC Calculation: 466 R Axis:   43 Text Interpretation: Sinus tachycardia Atrial premature complexes  Confirmed by Noemi Chapel [phone]) on 03/16/2021 3:58:57 PM  Radiology No results found.  Procedures .Critical Care Performed by: Noemi Chapel, MD Authorized by: Noemi Chapel, MD   Critical care provider statement:    Critical care time (minutes):  40   Critical care time was exclusive of:  Separately billable procedures and treating other patients and teaching time   Critical care was necessary to treat or prevent imminent or life-threatening deterioration of the following conditions:  Dehydration and metabolic crisis (hyponatremia)   Critical care was time spent personally by me on the following activities:  Development of treatment plan with patient or surrogate, discussions with consultants, evaluation of patient's response to treatment, examination of patient, ordering and review of laboratory studies, ordering and review of radiographic studies, ordering and performing treatments and interventions, pulse oximetry, re-evaluation of patient's condition, review of old charts and obtaining history from patient or surrogate   I assumed direction of critical care for this patient from another provider in my specialty: no     Care discussed with: admitting provider   Comments:          Medications Ordered in ED Medications  sodium chloride 0.9 % bolus 1,000 mL (1,000 mLs Intravenous New Bag/Given 03/16/21 1606)  ondansetron (ZOFRAN) injection 4 mg (4 mg Intravenous Given 03/16/21 1606)  methylPREDNISolone sodium succinate (SOLU-MEDROL) 125 mg/2 mL injection 125 mg (125 mg Intravenous Given 03/16/21 1607)  HYDROmorphone (  DILAUDID) injection 1 mg (1 mg Intravenous Given 03/16/21 1606)    ED Course/ Medical Decision Making/ A&P                           Medical Decision Making Amount and/or Complexity of Data Reviewed Labs: ordered. ECG/medicine tests: ordered.  Risk Prescription drug management. Decision regarding hospitalization.   This patient presents to the ED for concern of  diffuse arthralgia but also hypotension and tachycardia, this involves an extensive number of treatment options, and is a complaint that carries with it a high risk of complications and morbidity.  The differential diagnosis includes dehydration, autoimmune illness, possibly sepsis, consider other infection such as Lyme's disease or [facility], consider rhabdomyolysis, consider renal failure worsening, severe dehydration   Co morbidities that complicate the patient evaluation  Multiple visits for the same, multiple comorbidities including hypertension high cholesterol   Additional history obtained:  Additional history obtained from electronic medical record including multiple discharges and admissions to the hospital External records from outside source obtained and reviewed including prior medication dosing including steroids, he has run out of his oxycodone   Lab Tests:  I Ordered, and personally interpreted labs.  The pertinent results include: CBC, metabolic panel, magnesium and phosphorus levels, CK, all of these are relatively normal except for hyponatremia to 125.  Urinalysis not obtained due to oliguria, IV fluids running   Cardiac Monitoring:  The patient was maintained on a cardiac monitor.  I personally viewed and interpreted the cardiac monitored which showed an underlying rhythm of: Sinus tachycardia   Medicines ordered and prescription drug management:  I ordered medication including antiemetics and IV fluids for dehydration and nausea, also given hydromorphone and steroids for pain and swelling Reevaluation of the patient after these medicines showed that the patient improved I have reviewed the patients home medicines and have made adjustments as needed   Test Considered:  CT scan of the abdomen and pelvis over the head however does not seem warranted as the patient does not have focal symptoms in those areas other than nausea which improved with  medications   Critical Interventions:  IV fluids and antiemetics and pain medications and steroids, the patient is severely dehydrated to the point where he was hypotensive and tachycardic, this improved significantly with interventions   Consultations Obtained:  I requested consultation with the hospitalist Dr. Linda Hedges,  and discussed lab and imaging findings as well as pertinent plan - they recommend and agree to admission to the hospital.   Problem List / ED Course:  Diffuse polyarthralgia etiology unclear but likely some type of autoimmune illness.  Dehydration, likely separate from that but has hyponatremia in need of fluids, urinary output has been extremely low, oliguria state will need to be treated with IV fluids, inpatient.   Reevaluation:  After the interventions noted above, I reevaluated the patient and found that they have : Improved   Social Determinants of Health:  Nonambulatory   Dispostion:  After consideration of the diagnostic results and the patients response to treatment, I feel that the patent would benefit from admission to the hospital, high level of care, critical care provided.          Final Clinical Impression(s) / ED Diagnoses Final diagnoses:  Hyponatremia  Dehydration  Autoimmune disease ([place])  Polyarthralgia     Noemi Chapel, MD 03/16/21 [phone]

## 2021-03-16 NOTE — ED Notes (Signed)
Pt given a urinal and made aware that a urine specimen is needed for testing.

## 2021-03-16 NOTE — ED Triage Notes (Signed)
Pt to the ED with complaints of whole body joint pain after a fall one month ago.

## 2021-03-16 NOTE — H&P (Signed)
History and Physical    Patient: Phillip Pace QJJ:[id number] DOB: 1945/12/11 DOA: 03/16/2021 DOS: the patient was seen and examined on 03/16/2021 PCP: Celene Squibb, MD  Patient coming from: Home  Chief Complaint: polyarthralgia with weakness, dehydration Chief Complaint  Patient presents with   Joint Pain    HPI: Phillip Pace is a 76 y.o. 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 He has had multiple ED visits and several admissions for polyartrathalgias of unknown origin with a Jan admission during which time he had CT scans and multiple labs notable for elevated Sed Rate and CRP, LA latex turbid of 18. He was advised to see a rheumatologist but has not been able to do so yet. For several days his arthralgias have been worse limiting ambulation and use of his arms. He as felt increasingly weak and has had poor PO intake. He presents to [facility] for evaluation and treatment   Review of Systems: As mentioned in the history of present illness. All other systems reviewed and are negative.  Past Medical History:  Diagnosis Date   Arthritis    BPH (benign prostatic hyperplasia)    CHF (congestive heart failure) (HCC)    Chronic combined systolic (congestive) and diastolic (congestive) heart failure (HCC)    last Echo with EF 48-18%, grade I diastolic dysfxn   COPD (chronic obstructive pulmonary disease) (HCC)    Coronary artery disease    hx of multiple MI's with 13 stents   Dilated aortic root (HCC)    42 mm by 2D echo 12/2020   Gout    Hypertension    Past Surgical History:  Procedure Laterality Date   COLONOSCOPY N/A 05/25/2017   Procedure: COLONOSCOPY;  Surgeon: Daneil Dolin, MD;  Location: [facility];  Service: Endoscopy;  Laterality: N/A;  12:00pm   ESOPHAGOGASTRODUODENOSCOPY N/A 05/25/2017   Procedure: ESOPHAGOGASTRODUODENOSCOPY  (EGD);  Surgeon: Daneil Dolin, MD;  Location: [facility];  Service: Endoscopy;  Laterality: N/A;   FRACTURE SURGERY     collar bone   HERNIA REPAIR     x 2   MALONEY DILATION N/A 05/25/2017   Procedure: MALONEY DILATION;  Surgeon: Daneil Dolin, MD;  Location: [facility];  Service: Endoscopy;  Laterality: N/A;   stents 13     Social History:  reports that he quit smoking about 28 years ago. His smoking use included cigarettes. He has never used smokeless tobacco. He reports that he does not drink alcohol and does not use drugs.  Had a 30 year relationship - no children. With his current partner for 11 years. 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  Allergies  Allergen Reactions   Penicillins Shortness Of Breath, Itching, Swelling and Other (See Comments)    Has patient had a PCN reaction causing immediate rash, facial/tongue/throat swelling, SOB or lightheadedness with hypotension: Yes Has patient had a PCN reaction causing severe rash involving mucus membranes or skin necrosis: No Has patient had a PCN reaction that required hospitalization: No Has patient had a PCN reaction occurring within the last 10 years: No If all of the above answers are "NO", then may proceed with Cephalosporin use.    Shellfish Allergy Itching and Swelling    Family  History  Problem Relation Age of Onset   Cancer - Colon Mother    CAD Mother    Colon cancer Father    Gastric cancer Neg Hx    Esophageal cancer Neg Hx     Prior to Admission medications   Medication Sig Start Date End Date Taking? Authorizing Provider  allopurinol (ZYLOPRIM) 100 MG tablet Take 100 mg by mouth daily.   Yes [provider]  amLODipine (NORVASC) 5 MG tablet Take 0.5 tablets (2.5 mg total) by mouth daily. Patient taking differently: Take 5 mg by mouth daily. 02/21/21  Yes Barton Dubois,  MD  benzonatate (TESSALON) 200 MG capsule Take 200 mg by mouth 3 (three) times daily as needed for cough.   Yes [provider]  Cholecalciferol (VITAMIN D3) 5000 units CAPS Take 5,000 Units by mouth daily.   Yes [provider]  diclofenac Sodium (VOLTAREN) 1 % GEL Apply 2 g topically 4 (four) times daily.   Yes [provider]  feeding supplement (ENSURE ENLIVE / ENSURE PLUS) LIQD Take 237 mLs by mouth 2 (two) times daily between meals. 02/21/21  Yes Barton Dubois, MD  indomethacin (INDOCIN) 50 MG capsule Take 50 mg by mouth 2 (two) times daily with a meal.   Yes [provider]  lisinopril (ZESTRIL) 5 MG tablet Take 1 tablet (5 mg total) by mouth every evening. Patient taking differently: Take 5 mg by mouth daily. 02/27/21  Yes Barton Dubois, MD  methocarbamol (ROBAXIN) 750 MG tablet Take 750 mg by mouth 4 (four) times daily.   Yes [provider]  metoprolol succinate (TOPROL-XL) 25 MG 24 hr tablet Take 1 tablet (25 mg total) by mouth daily. 11/06/20  Yes Turner, Eber Hong, MD  NITROSTAT 0.4 MG SL tablet Place 0.4 mg under the tongue every 5 (five) minutes as needed. 07/06/14  Yes [provider]  Omega-3 Fatty Acids (FISH OIL) 1000 MG CAPS Take 2 capsules by mouth 2 (two) times daily.   Yes [provider]  ondansetron (ZOFRAN-ODT) 8 MG disintegrating tablet Take 8 mg by mouth 2 (two) times daily as needed for nausea or vomiting.   Yes [provider]  pantoprazole (PROTONIX) 40 MG tablet Take 1 tablet (40 mg total) by mouth 2 (two) times daily. TAKE 1 TABLET(40 MG) BY MOUTH TWICE DAILY BEFORE A MEAL Strength: 40 mg 02/21/21  Yes Barton Dubois, MD  predniSONE (DELTASONE) 50 MG tablet Take 50 mg by mouth daily with breakfast.   Yes [provider]  rosuvastatin (CRESTOR) 40 MG tablet Take 1 tablet (40 mg total) by mouth daily. 11/13/20 03/16/21 Yes Turner, Eber Hong, MD  tamsulosin (FLOMAX) 0.4 MG CAPS capsule Take 0.4 mg  by mouth daily.  11/25/15  Yes [provider]  traMADol (ULTRAM) 50 MG tablet Take 50 mg by mouth 2 (two) times daily.   Yes [provider]  acetaminophen (TYLENOL) 325 MG tablet Take 2 tablets (650 mg total) by mouth every 6 (six) hours as needed for mild pain (or Fever >/= 101). 02/21/21   Barton Dubois, MD  amLODipine (NORVASC) 2.5 MG tablet amlodipine 2.5 mg tablet  Take 1 tablet every day by oral route. Patient not taking: Reported on 03/16/2021    [provider]  aspirin EC 81 MG tablet Take 1 tablet (81 mg total) by mouth daily. Swallow whole. 11/06/20   Sueanne Margarita, MD  b complex vitamins capsule Take 1 capsule by mouth daily.    [provider]  B Complex-C (B-COMPLEX WITH VITAMIN C) tablet Take 1 tablet by mouth daily. 02/22/21   Barton Dubois, MD  dextromethorphan 15 MG/5ML syrup dextromethorphan HBr 15 mg/5 mL oral liquid  Take 10 mL every 6 hours by oral route as needed. Patient not taking: Reported on 03/16/2021    [provider]  HYDROcodone-acetaminophen (NORCO/VICODIN) 5-325 MG tablet SMARTSIG:1 Tablet(s) By Mouth Every 12 Hours Patient not taking: Reported on 03/16/2021 03/14/21   [provider]  icosapent Ethyl (VASCEPA) 1 g capsule Take 2 capsules (2 g total) by mouth 2 (two) times daily. Patient not taking: Reported on 03/16/2021 11/13/20   Sueanne Margarita, MD  magnesium oxide (MAG-OX) 400 MG tablet Take 400 mg by mouth daily.    [provider]  meloxicam (MOBIC) 7.5 MG tablet meloxicam 7.5 mg tablet Patient not taking: Reported on 03/16/2021    [provider]  oxyCODONE (ROXICODONE) 5 MG immediate release tablet Take 1 tablet (5 mg total) by mouth every 6 (six) hours as needed for severe pain. Patient not taking: Reported on 03/16/2021 02/21/21   Barton Dubois, MD  predniSONE (STERAPRED UNI-PAK 21 TAB) 10 MG (21) TBPK tablet Take by mouth daily. Take 6 tabs by mouth daily  for 3 days, then 5 tabs for  2 days, then 4 tabs for 2 days, then 3 tabs for 3 days, then 2 tabs for 2 days, then 1 tab by mouth daily for 3 days and stop prednisone. Patient not taking: Reported on 03/16/2021 02/21/21   Barton Dubois, MD    Physical Exam: Vitals:   03/16/21 1630 03/16/21 1700 03/16/21 1730 03/16/21 1800  BP: 110/66 103/81 103/65 104/74  Pulse: (!) 111 (!) 112 (!) 110 (!) 113  Resp: 20 11 13 17   Temp:      TempSrc:      SpO2: 90% 91% 90% 90%  Weight:      Height:       General - haggard, thin man looking older that his age in no distress HEENT - Edentulous, C&S clear Neck - supple, w/o thyromegaly Chest - w/o deformity Lungs- CTAP COR- 2+ radial and DP pulses, quiet precordium, RRR Abd - BS+, soft, no HSM, no tenderness MSK - no significant joint deformity, no induration of joint, no joint erythema, fingernails w/o pitting. Good ROM all small, medium and large joints Neuro - CN II-XI nl, Strength - 5/5 Derm - clear   Data Reviewed:  Cmet with Cr 1.35 , Na 125, Albumin 2.4, CK 40, Hgb 10.1 (baseline 9.5) EKG - sinus tach, no acute changes  Assessment and Plan: No notes have been filed under this hospital service. Service: Hospitalist Hyponatremia - mildly depressed. Cause unclear. Patient received 1 L IVF in ED Plan IVF - NS at 100 cc/hr x 12 hr than 75 cc/hr  F/u BMet in AM  2. Dehydration -  Plan  IVF as above  3. Rhematology - patient with polyarthralgias. No distinct findings on PE. Previous eval Jan 18. '23: ANA neg, CCP nl, CRP 26.9, Sed Rate 135, uric acid level in normal range, U/S l knee minimaly effusion. Patient has been taking Prednisone 50 mg daily Plan Continue prednisone, but begin slow taper  Celebrex 100 mg BID  APAP 650 mg tid  Tramadol 100 mg q 8 prn  Will need referral to rheumatology- schedule before he leaves hospital if possible.  4. Gout - no acute flare. He takes allopurinol daily and last uric acid level in normal  range.  5. HTN - SBP soft at  admission. Plan Hydration  Will resume ACE-I and amlodipine in AM  6. Combined HF - continue home medications  7. Malnutrition - low albumin, low BMI Plan Supplement TID  Dietician consult     Advance Care Planning:   Code Status: Full Code   Consults: none  Family Communication: wife present during interview and exam  Severity of Illness: The appropriate patient status for this patient is OBSERVATION. Observation status is judged to be reasonable and necessary in order to provide the required intensity of service to ensure the patient's safety. The patient's presenting symptoms, physical exam findings, and initial radiographic and laboratory data in the context of their medical condition is felt to place them at decreased risk for further clinical deterioration. Furthermore, it is anticipated that the patient will be medically stable for discharge from the hospital within 2 midnights of admission.   Author: Adella Hare, MD 03/16/2021 6:31 PM  For on call review [email or web address].

## 2021-03-17 DIAGNOSIS — I5042 Chronic combined systolic (congestive) and diastolic (congestive) heart failure: Secondary | ICD-10-CM | POA: Diagnosis not present

## 2021-03-17 DIAGNOSIS — E860 Dehydration: Secondary | ICD-10-CM | POA: Diagnosis not present

## 2021-03-17 DIAGNOSIS — N179 Acute kidney failure, unspecified: Secondary | ICD-10-CM | POA: Diagnosis not present

## 2021-03-17 DIAGNOSIS — L8990 Pressure ulcer of unspecified site, unspecified stage: Secondary | ICD-10-CM | POA: Insufficient documentation

## 2021-03-17 DIAGNOSIS — R531 Weakness: Secondary | ICD-10-CM

## 2021-03-17 DIAGNOSIS — I10 Essential (primary) hypertension: Secondary | ICD-10-CM

## 2021-03-17 DIAGNOSIS — N1831 Chronic kidney disease, stage 3a: Secondary | ICD-10-CM

## 2021-03-17 DIAGNOSIS — E871 Hypo-osmolality and hyponatremia: Secondary | ICD-10-CM | POA: Diagnosis not present

## 2021-03-17 DIAGNOSIS — I2510 Atherosclerotic heart disease of native coronary artery without angina pectoris: Secondary | ICD-10-CM | POA: Diagnosis present

## 2021-03-17 DIAGNOSIS — L8991 Pressure ulcer of unspecified site, stage 1: Secondary | ICD-10-CM

## 2021-03-17 LAB — RAPID URINE DRUG SCREEN, HOSP PERFORMED
Amphetamines: NOT DETECTED
Barbiturates: NOT DETECTED
Benzodiazepines: NOT DETECTED
Cocaine: NOT DETECTED
Opiates: NOT DETECTED
Tetrahydrocannabinol: NOT DETECTED

## 2021-03-17 LAB — RESP PANEL BY RT-PCR (FLU A&B, COVID) ARPGX2
Influenza A by PCR: NEGATIVE
Influenza B by PCR: NEGATIVE
SARS Coronavirus 2 by RT PCR: NEGATIVE

## 2021-03-17 LAB — URINALYSIS, ROUTINE W REFLEX MICROSCOPIC
Bilirubin Urine: NEGATIVE
Glucose, UA: NEGATIVE mg/dL
Hgb urine dipstick: NEGATIVE
Ketones, ur: 5 mg/dL — AB
Leukocytes,Ua: NEGATIVE
Nitrite: NEGATIVE
Protein, ur: 30 mg/dL — AB
Specific Gravity, Urine: 1.015 (ref 1.005–1.030)
pH: 5 (ref 5.0–8.0)

## 2021-03-17 LAB — SEDIMENTATION RATE: Sed Rate: 125 mm/hr — ABNORMAL HIGH (ref 0–16)

## 2021-03-17 LAB — BASIC METABOLIC PANEL
Anion gap: 11 (ref 5–15)
BUN: 34 mg/dL — ABNORMAL HIGH (ref 8–23)
CO2: 22 mmol/L (ref 22–32)
Calcium: 8.9 mg/dL (ref 8.9–10.3)
Chloride: 97 mmol/L — ABNORMAL LOW (ref 98–111)
Creatinine, Ser: 1.49 mg/dL — ABNORMAL HIGH (ref 0.61–1.24)
GFR, Estimated: 49 mL/min — ABNORMAL LOW (ref >60)
Glucose, Bld: 186 mg/dL — ABNORMAL HIGH (ref 70–99)
Potassium: 4.4 mmol/L (ref 3.5–5.1)
Sodium: 130 mmol/L — ABNORMAL LOW (ref 135–145)

## 2021-03-17 LAB — VITAMIN B12: Vitamin B-12: 621 pg/mL (ref 180–914)

## 2021-03-17 LAB — TSH: TSH: 0.871 u[IU]/mL (ref 0.350–4.500)

## 2021-03-17 LAB — C-REACTIVE PROTEIN
CRP: 27.2 mg/dL — ABNORMAL HIGH (ref <1.0)
CRP: 20.2 mg/dL — ABNORMAL HIGH (ref <1.0)

## 2021-03-17 LAB — FOLATE: Folate: 20.6 ng/mL (ref >5.9)

## 2021-03-17 MED ORDER — ADULT MULTIVITAMIN W/MINERALS CH
1.0000 | ORAL_TABLET | Freq: Every day | ORAL | Status: DC
  Administered 2021-03-17 – 2021-03-18 (×2): 1 via ORAL
  Filled 2021-03-17 (×2): qty 1

## 2021-03-17 MED ORDER — SODIUM CHLORIDE 0.9 % IV SOLN: INTRAVENOUS | Status: DC

## 2021-03-17 NOTE — Assessment & Plan Note (Addendum)
Work-up as above C02--217 VGV--0.254 Folic CYOY--24.1 Thiamine level pending UA negative for pyuria Check UDS--neg PT evaluation--SNF>>>pt refuses>>>d/c home with HHPT

## 2021-03-17 NOTE — Assessment & Plan Note (Signed)
No chest pain presently Continue aspirin

## 2021-03-17 NOTE — Assessment & Plan Note (Signed)
Patient has impaired glucose tolerance 02/19/2021 hemoglobin A1c 6.2 Lifestyle modification

## 2021-03-17 NOTE — Assessment & Plan Note (Signed)
- 

## 2021-03-17 NOTE — Assessment & Plan Note (Addendum)
Baseline creatinine 1.0-1.3 Continue IV fluids Discontinue celecoxib Serum creatinine 1.19 on day of dc

## 2021-03-17 NOTE — Care Management Obs Status (Signed)
[place] NOTIFICATION   Patient Details  Name: Phillip Pace MRN: [id number] Date of Birth: 1945-04-18   Medicare Observation Status Notification Given:  Yes    Tommy Medal 03/17/2021, 4:42 PM

## 2021-03-17 NOTE — Progress Notes (Signed)
PROGRESS NOTE  Phillip Pace VQM:[id number] DOB: 09/06/1945 DOA: 03/16/2021 PCP: Celene Squibb, MD  Brief History:  76 year old male with a history of coronary artery disease s/p multiple MI, s/p PCI with stents; combined HF with EF 45-50% and grade I diastolic dysfunction, gout, BPH, Barrett's esophagus, HTN, CKD 3 presenting with generalized weakness and polyarthralgias.  Patient had a similar presentation when he was admitted to the hospital from 02/19/2021 to 02/21/2021 when he was treated for AKI.  He was subsequently sent to [facility] for STR where he felt like he improved to a point where he was able to ambulate with a cane and a walker.  However, the patient states that since he returned home on 03/07/2021, he has had a gradual decline with increasing generalized weakness with difficulty getting out of bed as well as his usual polyarthralgias involving his wrists, elbows, knees.  He denies any frank synovitis.  He denies any frank fevers, chills, headache, neck pain, visual disturbance.  There is been no dysuria or hematuria.  The patient was discharged from his last hospitalization with a prednisone taper for 8 days.  The patient states that he felt stronger and better during the prednisone course.  However after the course was finished, he felt like there was some regression regarding his strength and polyarthralgias.  He states that he was recently placed on some new medication by his PCP, but he does not now remember.  The patient has poor insight regarding his medications.  On 03/16/2021, the patient had 1 episode of nausea and vomiting.  He has had decreased oral intake.  He denies any frank diarrhea, abdominal pain, hematochezia, melena.  Review of the medical record shows that the patient has had numerous ED visits for polyarthralgias.  On 03/16/2021 he had ESR 137, and CRP 26.9 on 02/19/21.  He also had a negative ANA and negative CCP and mildly positive RA on 02/19/2021. Upon EMS arrival,  the patient was noted to be hypotensive and tachycardic.  In the ED, the patient was afebrile with soft blood pressures in the 90s.  He was tachycardic up to the 130-140 range.  Sodium 125, potassium 4.1, chloride 91, CO2 19, serum creatinine 1.35.  The patient was admitted for further evaluation and treatment of his dehydration and generalized weakness.    Assessment and Plan: * Hyponatremia- (present on admission) Secondary to volume depletion and poor solute intake Continue IV fluids>> improving  Polyarthralgia- (present on admission) Current working diagnosis is polymyalgia rheumatica Certainly, consideration for mixed connective tissue disease is in the differential Patient will need referral to outpatient rheumatology after discharge Check ESR CRP Anti ENA Anti SS-A and SS-B Check complements Check double-stranded DNA Check aldolase CPK 40 Start empiric prednisone  Acute renal failure superimposed on stage 3a chronic kidney disease ([place])- (present on admission) Baseline creatinine 1.0-1.3 Continue IV fluids Discontinue celecoxib  Generalized weakness Work-up as above D32 TSH Folic acid Thiamine UA negative for pyuria Check UDS PT evaluation  Chronic combined systolic and diastolic CHF (congestive heart failure) ([place])- (present on admission) 12/06/2020 echo EF 45-50%, G1DD, trivial MR, small pericardial effusion Clinically euvolemic Restart metoprolol succinate Restart lisinopril as blood pressure allows  Pressure ulcer with suspected deep tissue injury, stage I Local wound care  Coronary disease- (present on admission) No chest pain presently Continue aspirin  Essential hypertension Continue metoprolol succinate Holding lisinopril secondary to soft blood pressure  Hyperglycemia- (present on admission) Patient has  impaired glucose tolerance 02/19/2021 hemoglobin A1c 6.2 Lifestyle modification         Status is: Observation The patient will require  care spanning > 2 midnights and should be moved to inpatient because: Continues to have electrolyte abnormalities requiring IV fluid replacement.  Unsafe discharge to home.  New functional decline requiring increasing need for assistance with ADLs          Family Communication:   no Family at bedside  Consultants:  none  Code Status:  FULL  DVT Prophylaxis:  [place] Lovenox   Procedures: As Listed in Progress Note Above  Antibiotics: None  RN Pressure Injury Documentation: Pressure Injury 03/17/21 Coccyx Medial Stage 1 -  Intact skin with non-blanchable redness of a localized area usually over a bony prominence. (Active)  03/17/21 0259  Location: Coccyx  Location Orientation: Medial  Staging: Stage 1 -  Intact skin with non-blanchable redness of a localized area usually over a bony prominence.  Wound Description (Comments):   Present on Admission: Yes        Subjective: Patient feels little better than yesterday but continues to feel weak.  Denies any headache, chest pain, shortness breath, cough, hemoptysis, nausea, vomiting or diarrhea abdominal pain.  Objective: Vitals:   03/17/21 0152 03/17/21 0153 03/17/21 0246 03/17/21 0655  BP: 98/70  110/69 135/72  Pulse: 92  77 92  Resp: 13  18 18   Temp:  97.6 F (36.4 C)    TempSrc:  Oral    SpO2: 94%  97% 96%  Weight:   74.3 kg   Height:   6' 3"  (1.905 m)     Intake/Output Summary (Last 24 hours) at 03/17/2021 0911 Last data filed at 03/17/2021 0250 Gross per 24 hour  Intake 1833.97 ml  Output --  Net 1833.97 ml   Weight change:  Exam:  General:  Pt is alert, follows commands appropriately, not in acute distress HEENT: No icterus, No thrush, No neck mass, [place]/AT Cardiovascular: RRR, S1/S2, no rubs, no gallops Respiratory: CTA bilaterally, no wheezing, no crackles, no rhonchi Abdomen: Soft/+BS, non tender, non distended, no guarding Extremities: No edema, No lymphangitis, No petechiae, No rashes, no  synovitis Neuro:  CN II-XII intact, strength 4/5 in RUE, RLE, strength 4/5 LUE, LLE; sensation intact bilateral; no dysmetria; babinski equivocal    Data Reviewed: I have personally reviewed following labs and imaging studies Basic Metabolic Panel: Recent Labs  Lab 03/16/21 1610 03/17/21 0515  NA 125* 130*  K 4.1 4.4  CL 91* 97*  CO2 19* 22  GLUCOSE 137* 186*  BUN 23 34*  CREATININE 1.35* 1.49*  CALCIUM 9.2 8.9  MG 1.7  --   PHOS 3.4  --    Liver Function Tests: Recent Labs  Lab 03/16/21 1610  AST 29  ALT 24  ALKPHOS 111  BILITOT 0.7  PROT 7.9  ALBUMIN 2.4*   No results for input(s): LIPASE, AMYLASE in the last 168 hours. No results for input(s): AMMONIA in the last 168 hours. Coagulation Profile: No results for input(s): INR, PROTIME in the last 168 hours. CBC: Recent Labs  Lab 03/16/21 1610  WBC 7.4  NEUTROABS 6.0  HGB 10.1*  HCT 31.6*  MCV 81.7  PLT 480*   Cardiac Enzymes: Recent Labs  Lab 03/16/21 1610  CKTOTAL 40*   BNP: Invalid input(s): POCBNP CBG: No results for input(s): GLUCAP in the last 168 hours. HbA1C: No results for input(s): HGBA1C in the last 72 hours. Urine analysis:  Component Value Date/Time   COLORURINE AMBER (A) 03/17/2021 0115   APPEARANCEUR CLOUDY (A) 03/17/2021 0115   LABSPEC 1.015 03/17/2021 0115   PHURINE 5.0 03/17/2021 0115   GLUCOSEU NEGATIVE 03/17/2021 0115   HGBUR NEGATIVE 03/17/2021 0115   BILIRUBINUR NEGATIVE 03/17/2021 0115   KETONESUR 5 (A) 03/17/2021 0115   PROTEINUR 30 (A) 03/17/2021 0115   UROBILINOGEN 0.2 07/20/2014 2126   NITRITE NEGATIVE 03/17/2021 0115   LEUKOCYTESUR NEGATIVE 03/17/2021 0115   Sepsis Labs: @LABRCNTIP (procalcitonin:4,lacticidven:4) ) Recent Results (from the past 240 hour(s))  Resp Panel by RT-PCR (Flu A&B, Covid) Nasopharyngeal Swab     Status: None   Collection Time: 03/17/21 12:44 AM   Specimen: Nasopharyngeal Swab; Nasopharyngeal(NP) swabs in vial transport medium   Result Value Ref Range Status   SARS Coronavirus 2 by RT PCR NEGATIVE NEGATIVE Final    Comment: (NOTE) SARS-CoV-2 target nucleic acids are NOT DETECTED.  The SARS-CoV-2 RNA is generally detectable in upper respiratory specimens during the acute phase of infection. The lowest concentration of SARS-CoV-2 viral copies this assay can detect is 138 copies/mL. A negative result does not preclude SARS-Cov-2 infection and should not be used as the sole basis for treatment or other patient management decisions. A negative result may occur with  improper specimen collection/handling, submission of specimen other than nasopharyngeal swab, presence of viral mutation(s) within the areas targeted by this assay, and inadequate number of viral copies(<138 copies/mL). A negative result must be combined with clinical observations, patient history, and epidemiological information. The expected result is Negative.  Fact Sheet for Patients:  EntrepreneurPulse.com.au  Fact Sheet for Healthcare Providers:  IncredibleEmployment.be  This test is no t yet approved or cleared by the [place] FDA and  has been authorized for detection and/or diagnosis of SARS-CoV-2 by FDA under an Emergency Use Authorization (EUA). This EUA will remain  in effect (meaning this test can be used) for the duration of the COVID-19 declaration under Section 564(b)(1) of the Act, 21 U.S.C.section 360bbb-3(b)(1), unless the authorization is terminated  or revoked sooner.       Influenza A by PCR NEGATIVE NEGATIVE Final   Influenza B by PCR NEGATIVE NEGATIVE Final    Comment: (NOTE) The Xpert Xpress SARS-CoV-2/FLU/RSV plus assay is intended as an aid in the diagnosis of influenza from Nasopharyngeal swab specimens and should not be used as a sole basis for treatment. Nasal washings and aspirates are unacceptable for Xpert Xpress SARS-CoV-2/FLU/RSV testing.  Fact Sheet for  Patients: EntrepreneurPulse.com.au  Fact Sheet for Healthcare Providers: IncredibleEmployment.be  This test is not yet approved or cleared by the [place] FDA and has been authorized for detection and/or diagnosis of SARS-CoV-2 by FDA under an Emergency Use Authorization (EUA). This EUA will remain in effect (meaning this test can be used) for the duration of the COVID-19 declaration under Section 564(b)(1) of the Act, 21 U.S.C. section 360bbb-3(b)(1), unless the authorization is terminated or revoked.  Performed at [facility], [address] [place], [place] [address]      Scheduled Meds:  allopurinol  100 mg Oral Daily   aspirin  81 mg Oral Daily   enoxaparin (LOVENOX) injection  40 mg Subcutaneous Q24H   feeding supplement  237 mL Oral BID BM   metoprolol succinate  25 mg Oral Daily   pantoprazole  40 mg Oral BID   predniSONE  50 mg Oral Q breakfast   rosuvastatin  40 mg Oral Daily   tamsulosin  0.4 mg Oral Daily  Continuous Infusions:  Procedures/Studies: CT ABDOMEN PELVIS WO CONTRAST  Result Date: 02/19/2021 CLINICAL DATA:  Weakness, lower back pain, hypotension, weight loss EXAM: CT ABDOMEN AND PELVIS WITHOUT CONTRAST TECHNIQUE: Multidetector CT imaging of the abdomen and pelvis was performed following the standard protocol without IV contrast. RADIATION DOSE REDUCTION: This exam was performed according to the departmental dose-optimization program which includes automated exposure control, adjustment of the mA and/or kV according to patient size and/or use of iterative reconstruction technique. COMPARISON:  None. FINDINGS: Lower chest: There is subsegmental atelectasis in the lung bases. Coronary artery calcifications are noted. There is trace pericardial fluid. Hepatobiliary: There is a small calcified granuloma in the left hepatic lobe. The liver is otherwise unremarkable, within the confines of noncontrast technique. The  gallbladder is unremarkable. There is no biliary ductal dilatation. Pancreas: Unremarkable. Spleen: Multiple calcified granulomas are seen in the spleen. The spleen is otherwise unremarkable. Adrenals/Urinary Tract: The adrenals are unremarkable. The kidneys are unremarkable, with no focal lesion, stone, hydronephrosis, or hydroureter. The bladder is unremarkable. Stomach/Bowel: The stomach is unremarkable there is no evidence of bowel obstruction. There is no abnormal bowel wall thickening or inflammatory change. The appendix is normal. Vascular/Lymphatic: There is scattered calcified atherosclerotic plaque throughout the nonaneurysmal abdominal aorta. There is no abdominopelvic lymphadenopathy. Reproductive: The prostate and seminal vesicles are unremarkable. Other: There is no ascites or free air. Musculoskeletal: There is no acute osseous abnormality or aggressive osseous lesion. The lumbar spine is assessed in full on the separately dictated CT lumbar spine. IMPRESSION: 1. No acute findings in the abdomen or pelvis. No finding to explain the patient's presentation or weight loss. 2. Coronary artery calcifications and trace pericardial fluid. Electronically Signed   By: Valetta Mole M.D.   On: 02/19/2021 15:26   CT CHEST WO CONTRAST  Result Date: 02/19/2021 CLINICAL DATA:  Late loss, history of smoking and Barrett's esophagus EXAM: CT CHEST WITHOUT CONTRAST TECHNIQUE: Multidetector CT imaging of the chest was performed following the standard protocol without IV contrast. RADIATION DOSE REDUCTION: This exam was performed according to the departmental dose-optimization program which includes automated exposure control, adjustment of the mA and/or kV according to patient size and/or use of iterative reconstruction technique. COMPARISON:  No prior chest CT. FINDINGS: Cardiovascular: Three-vessel aortic arch. Mild aortic atherosclerosis. The aorta appears to be within normal limits for size. The heart is normal  in size. Small volume pericardial fluid. Severe coronary artery calcifications. Mediastinum/Nodes: Calcified bilateral hilar and mediastinal lymph nodes, likely sequela of prior granulomatous disease. Paraesophageal lymph nodes measure up to 7 mm in short axis (series 2, image 89 and 103). Prominent mediastinal lymph nodes measure up to 6 mm (series 2, image 54), but retain their normal reniform morphology. Evaluation of hilar lymph nodes is somewhat limited in the absence of intravenous contrast. The thyroid gland is unremarkable. The esophagus and trachea demonstrate no significant abnormality. Lungs/Pleura: Apical predominant centrilobular emphysema. Dependent atelectasis. No focal pulmonary opacity. Multiple tiny calcified nodules, likely sequela of prior granulomatous disease. No suspicious pulmonary nodule. Upper Abdomen: Please see same-day CT abdomen pelvis. Musculoskeletal: Degenerative changes in the thoracic spine. No acute osseous abnormality. IMPRESSION: 1. Prominent paraesophageal lymph nodes, which measure up to 7 mm. No other definite lymphadenopathy, although evaluation of hilar lymph nodes is limited by the absence of intravenous contrast. 2. No focal pulmonary opacity or definite neoplasm. 3. Small volume pericardial fluid. 4. Severe coronary artery calcifications. Mild aortic atherosclerosis (ICD10-I70.0). 5. For findings in the abdomen please see  same-day CT abdomen pelvis. Electronically Signed   By: Merilyn Baba M.D.   On: 02/19/2021 17:17   [place] LIMITED JOINT SPACE STRUCTURES LOW LEFT  Result Date: 02/20/2021 CLINICAL DATA:  LEFT knee joint effusion, question gout EXAM: ULTRASOUND LEFT LOWER EXTREMITY LIMITED TECHNIQUE: Ultrasound examination of the lower extremity soft tissues was performed in the area of clinical concern, the LEFT knee joint. COMPARISON:  LEFT knee radiographs 02/19/2021 FINDINGS: Minimal joint effusion at suprapatellar recess. No additional joint fluid identified. No  adequate pocket of fluid is seen to allow for arthrocentesis. IMPRESSION: Minimal joint effusion, insufficient for arthrocentesis. Electronically Signed   By: Lavonia Dana M.D.   On: 02/20/2021 11:29   CT L-SPINE NO CHARGE  Result Date: 02/19/2021 CLINICAL DATA:  Low back pain, weight loss weakness, hypotension EXAM: CT LUMBAR SPINE WITHOUT CONTRAST TECHNIQUE: Multidetector CT imaging of the lumbar spine was performed without intravenous contrast administration. Multiplanar CT image reconstructions were also generated. RADIATION DOSE REDUCTION: This exam was performed according to the departmental dose-optimization program which includes automated exposure control, adjustment of the mA and/or kV according to patient size and/or use of iterative reconstruction technique. COMPARISON:  Lumbar spine radiographs 08/27/2014 FINDINGS: Segmentation: Standard; the lowest formed disc space is designated L5-S1. Alignment: There is trace retrolisthesis of L1 on L2 and L2 on L3, not significantly changed since 2016. There is mild dextrocurvature centered at L2-L3. Vertebrae: Vertebral body heights are preserved. There is no evidence of acute injury. Densely sclerotic lesions in the L1 and L4 vertebral bodies most likely reflect bone islands (average and max attenuations at L1 are 938 and 1,409 Hounsfield units, respectively, and 968 and 1,268 Hounsfield units at L4, respectively. There is a prominent Schmorl's node indenting the superior L3 endplate. There is a lucent lesion without aggressive features in the right iliac bone. There are no suspicious osseous lesions. Paraspinal and other soft tissues: The paraspinal soft tissues are unremarkable. The abdominal and pelvic viscera are assessed on the separately dictated CT abdomen/pelvis. Disc levels: There is marked intervertebral disc space narrowing at L2-L3. There is more mild disc space narrowing at the remaining levels. There is vacuum disc phenomenon at L5-S1. T12-L1: No  significant spinal canal or neural foraminal stenosis. L1-L2: Mild degenerative endplate change and facet arthropathy without significant spinal canal or neural foraminal stenosis. L2-L3: There is degenerative endplate change and mild bilateral facet arthropathy with grade 1 retrolisthesis resulting in mild spinal canal stenosis without significant neural foraminal stenosis. L3-L4: There is a mild disc bulge and mild bilateral facet arthropathy without significant spinal canal or neural foraminal stenosis. L4-L5: There is a mild disc bulge, ligamentum flavum thickening, and moderate right worse than left facet arthropathy resulting in mild spinal canal stenosis without significant neural foraminal stenosis. L5-S1: There is a mild disc bulge and moderate right worse than left facet arthropathy without significant spinal canal or neural foraminal stenosis. IMPRESSION: 1. No evidence of acute injury in the lumbar spine. 2. Grade 1 retrolisthesis of L1 on L2 and L2 on L3, not significantly changed since 2016. Mild dextrocurvature centered at L2-L3. 3. Marked intervertebral disc space narrowing at L2-L3. 4. Multilevel facet arthropathy, most advanced on the right at L4-L5 and L5-S1. 5. No high-grade spinal canal or neural foraminal stenosis. 6.  Aortic Atherosclerosis (ICD10-I70.0). Electronically Signed   By: Valetta Mole M.D.   On: 02/19/2021 15:34   DG Chest Port 1 View  Result Date: 02/19/2021 CLINICAL DATA:  Short of breath and weakness EXAM:  PORTABLE CHEST 1 VIEW COMPARISON:  01/24/2021 FINDINGS: The heart size and mediastinal contours are within normal limits. Both lungs are clear. The visualized skeletal structures are unremarkable. IMPRESSION: No active disease. Electronically Signed   By: Franchot Gallo M.D.   On: 02/19/2021 14:57   DG Knee 3 Views Left  Result Date: 02/19/2021 CLINICAL DATA:  Left knee pain. EXAM: LEFT KNEE - 3 VIEW COMPARISON:  01/18/2021 FINDINGS: There appears to be resolution of  the prior moderate knee joint effusion. Mild superior patellar degenerative osteophytosis, unchanged. Mild medial and lateral patellar osteophytosis. The medial and lateral compartment joint spaces are maintained. Possible minimal patellofemoral joint space narrowing. No acute fracture or dislocation. IMPRESSION:: IMPRESSION: 1. Resolution of the prior joint effusion. 2. Mild patellofemoral osteoarthritis. Electronically Signed   By: Yvonne Kendall M.D.   On: 02/19/2021 16:50    Orson Eva, DO  [facility]  If 7PM-7AM, please contact night-coverage [email or web address] Password [email or web address] 03/17/2021, 9:11 AM   LOS: 0 days

## 2021-03-17 NOTE — Progress Notes (Signed)
Initial Nutrition Assessment  DOCUMENTATION CODES:   Not applicable  INTERVENTION:  - Encourage adequate PO intake - Continue Ensure Enlive po BID, each supplement provides 350 kcal and 20 grams of protein. - MVI with minerals daily  NUTRITION DIAGNOSIS:   Increased nutrient needs related to acute illness, wound healing as evidenced by estimated needs.  GOAL:   Patient will meet greater than or equal to 90% of their needs  MONITOR:   PO intake, Supplement acceptance, Labs, Weight trends, Skin  REASON FOR ASSESSMENT:   Malnutrition Screening Tool    ASSESSMENT:   Pt admitted from home d/t polyarthralgia with weakness and dehydration. Previous hosptal admission from 1/18-1/20 for similar symptoms. PMH inlcudes CAD s/p multiple MI, s/p PCI with stents, combined HR with EF 45-50% and grade I diastolic dysfunction, gout, BPH, Barrett's esophagus, HTN,and CKD 2.  Pt states that today he has a great appetite. Noted meal completion of 80% x 1 meal today. He reports that PTA, his appetite was going up and down but could not quantify how many meals a day he was eating. He states that when he is in the hospital that he feels much better and is able to ambulate with a walker but then goes home and feels weak and dehydrated again. He enjoys Ensure and would like to continue to receive them during admission.  Per review of chart, pt with decrease in weight since 01/18/21. Noted 17% weight loss within the last 2 months. Pt states that he usually weighs 200-205 lbs and is not sure how much he currently weighs but noted significant weight loss within a 2 week period recently d/t dehydration and weakness.   Suspect a degree of malnutrition present, however unable to confirm without NFPE. Will reassess at follow up.  Medication: protonix, prednisone  Labs: sodium 130, BUN 34, Cr 1.49,    NUTRITION - FOCUSED PHYSICAL EXAM: RD working remotely- deferred to follow up  Diet Order:   Diet Order              Diet regular Room service appropriate? Yes; Fluid consistency: Thin  Diet effective now                   EDUCATION NEEDS:   Education needs have been addressed  Skin:  Skin Assessment: Skin Integrity Issues: Skin Integrity Issues:: Stage I Stage I: coccyx  Last BM:  2/13  Height:   Ht Readings from Last 1 Encounters:  03/17/21 6\' 3"  (1.905 m)    Weight:   Wt Readings from Last 1 Encounters:  03/17/21 74.3 kg    BMI:  Body mass index is 20.47 kg/m.  Estimated Nutritional Needs:   Kcal:  2000-2200  Protein:  100-115g  Fluid:  >/=2L  Phillip Pace, RDN, LDN Clinical Nutrition

## 2021-03-17 NOTE — Assessment & Plan Note (Addendum)
Continue metoprolol succinate Holding lisinopril secondary to soft blood pressure>>restart

## 2021-03-17 NOTE — ED Notes (Signed)
Pt on bedside commode.

## 2021-03-17 NOTE — Hospital Course (Addendum)
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  Patient had a similar presentation when he was admitted to the hospital from 02/19/2021 to 02/21/2021 when he was treated for AKI.  He was subsequently sent to [facility] for STR where he felt like he improved to a point where he was able to ambulate with a cane and a walker.  However, the patient states that since he returned home on 03/07/2021, he has had a gradual decline with increasing generalized weakness with difficulty getting out of bed as well as his usual polyarthralgias involving his wrists, elbows, knees.  He denies any frank synovitis.  He denies any frank fevers, chills, headache, neck pain, visual disturbance.  There is been no dysuria or hematuria.  The patient was discharged from his last hospitalization with a prednisone taper for 8 days.  The patient states that he felt stronger and better during the prednisone course.  However after the course was finished, he felt like there was some regression regarding his strength and polyarthralgias.  He states that he was recently placed on some new medication by his PCP, but he does not now remember.  The patient has poor insight regarding his medications.  On 03/16/2021, the patient had 1 episode of nausea and vomiting.  He has had decreased oral intake.  He denies any frank diarrhea, abdominal pain, hematochezia, melena.  Review of the medical record shows that the patient has had numerous ED visits for polyarthralgias.  On 03/16/2021 he had ESR 137, and CRP 26.9 on 02/19/21.  He also had a negative ANA and negative CCP and mildly positive RA on 02/19/2021. Upon EMS arrival, the patient was noted to be hypotensive and tachycardic.  In the ED, the patient was afebrile with soft blood pressures in the 90s.   He was tachycardic up to the 130-140 range.  Sodium 125, potassium 4.1, chloride 91, CO2 19, serum creatinine 1.35.  The patient was admitted for further evaluation and treatment of his dehydration and generalized weakness. He was started on IV with improvement of his Na.  He was started on prednisone with improvement of his polyarthralgia and proximal muscle weakness.  Referral request made to see rheumatology, Dr. Lahoma Rocker

## 2021-03-17 NOTE — Assessment & Plan Note (Signed)
12/06/2020 echo EF 45-50%, G1DD, trivial MR, small pericardial effusion Clinically euvolemic Restart metoprolol succinate Restart lisinopril as blood pressure allows

## 2021-03-17 NOTE — Assessment & Plan Note (Addendum)
Current working diagnosis is polymyalgia rheumatica Certainly, consideration for mixed connective tissue disease is in the differential Patient will need referral to outpatient rheumatology after discharge Check ESR--125 CRP 27.2>>20.2>>9.8 on prednisone Anti ENA Anti SS-A and SS-B Check complements Check double-stranded DNA Check aldolase--3.3 CPK 40 Start empiric prednisone>>50 mg daily x 1 week, then 40 mg daily x 1 week (start 03/24/21), then 30 mg daily x 1 week, then 20 mg daily x 1 week, then 10 mg daily x 1 week Polyarthralgia and muscle weakness improving with prednisone

## 2021-03-17 NOTE — Assessment & Plan Note (Addendum)
Secondary to volume depletion and poor solute intake Continue IV fluids>> improving Na = 138 on day of dc

## 2021-03-17 NOTE — ED Notes (Signed)
Assisted pt of bedside commode, provided peri care, and back into bed: 2 man assist.

## 2021-03-18 DIAGNOSIS — N179 Acute kidney failure, unspecified: Secondary | ICD-10-CM | POA: Diagnosis not present

## 2021-03-18 DIAGNOSIS — N1831 Chronic kidney disease, stage 3a: Secondary | ICD-10-CM | POA: Diagnosis not present

## 2021-03-18 DIAGNOSIS — E871 Hypo-osmolality and hyponatremia: Secondary | ICD-10-CM | POA: Diagnosis not present

## 2021-03-18 DIAGNOSIS — I5042 Chronic combined systolic (congestive) and diastolic (congestive) heart failure: Secondary | ICD-10-CM | POA: Diagnosis not present

## 2021-03-18 DIAGNOSIS — M2550 Pain in unspecified joint: Secondary | ICD-10-CM

## 2021-03-18 LAB — CBC
HCT: 24.2 % — ABNORMAL LOW (ref 39.0–52.0)
Hemoglobin: 7.6 g/dL — ABNORMAL LOW (ref 13.0–17.0)
MCH: 26 pg (ref 26.0–34.0)
MCHC: 31.4 g/dL (ref 30.0–36.0)
MCV: 82.9 fL (ref 80.0–100.0)
Platelets: 348 10*3/uL (ref 150–400)
RBC: 2.92 MIL/uL — ABNORMAL LOW (ref 4.22–5.81)
RDW: 16.6 % — ABNORMAL HIGH (ref 11.5–15.5)
WBC: 7.2 10*3/uL (ref 4.0–10.5)
nRBC: 0 % (ref 0.0–0.2)

## 2021-03-18 LAB — BASIC METABOLIC PANEL
Anion gap: 11 (ref 5–15)
BUN: 34 mg/dL — ABNORMAL HIGH (ref 8–23)
CO2: 21 mmol/L — ABNORMAL LOW (ref 22–32)
Calcium: 8.8 mg/dL — ABNORMAL LOW (ref 8.9–10.3)
Chloride: 106 mmol/L (ref 98–111)
Creatinine, Ser: 1.19 mg/dL (ref 0.61–1.24)
GFR, Estimated: >60 mL/min (ref >60)
Glucose, Bld: 240 mg/dL — ABNORMAL HIGH (ref 70–99)
Potassium: 3.8 mmol/L (ref 3.5–5.1)
Sodium: 138 mmol/L (ref 135–145)

## 2021-03-18 LAB — C-REACTIVE PROTEIN: CRP: 9.8 mg/dL — ABNORMAL HIGH (ref <1.0)

## 2021-03-18 LAB — C4 COMPLEMENT: Complement C4, Body Fluid: 36 mg/dL (ref 12–38)

## 2021-03-18 LAB — C3 COMPLEMENT: C3 Complement: 183 mg/dL — ABNORMAL HIGH (ref 82–167)

## 2021-03-18 LAB — ANTIEXTRACTABLE NUCLEAR AG
ENA SM Ab Ser-aCnc: <0.2 AI (ref 0.0–0.9)
Ribonucleic Protein: <0.2 AI (ref 0.0–0.9)

## 2021-03-18 LAB — SJOGRENS SYNDROME-A EXTRACTABLE NUCLEAR ANTIBODY: SSA (Ro) (ENA) Antibody, IgG: <0.2 AI (ref 0.0–0.9)

## 2021-03-18 LAB — MAGNESIUM: Magnesium: 1.8 mg/dL (ref 1.7–2.4)

## 2021-03-18 LAB — SJOGRENS SYNDROME-B EXTRACTABLE NUCLEAR ANTIBODY: SSB (La) (ENA) Antibody, IgG: <0.2 AI (ref 0.0–0.9)

## 2021-03-18 LAB — ANTI-DNA ANTIBODY, DOUBLE-STRANDED: ds DNA Ab: <1 IU/mL (ref 0–9)

## 2021-03-18 LAB — ALDOLASE: Aldolase: 3.3 U/L (ref 3.3–10.3)

## 2021-03-18 MED ORDER — PREDNISONE 10 MG PO TABS: 50.0000 mg | ORAL_TABLET | Freq: Every day | ORAL | 0 refills | Status: DC

## 2021-03-18 NOTE — Progress Notes (Signed)
PT recommends SNF. Discussed recommendation with patient. Patient was just recently at [facility] and is currently in copay days. Patient indicates that he cannot afford copay days. Patient indicates that he was discharged with [facility] services. Patient in obs and does not need resumption orders for [facility].  Monserrath Junio, Clydene Pugh, LCSW

## 2021-03-18 NOTE — Discharge Summary (Signed)
Physician Discharge Summary   Patient: Phillip Pace MRN: [id number] DOB: 1945-02-14  Admit date:     03/16/2021  Discharge date: 03/18/21  Discharge Physician: Shanon Brow Tat   PCP: Celene Squibb, MD   Recommendations at discharge:   Please follow up with primary care provider within 1-2 weeks  Please repeat BMP and CBC in one week  Please follow up on/with rheumatology in 1 month--Dr. Lenetta Quaker Aryal--referral request sent      Hospital Course: 76 year old male with a history of coronary artery disease s/p multiple MI, s/p PCI with stents; combined HF with EF 45-50% and grade I diastolic dysfunction, gout, BPH, Barrett's esophagus, HTN, CKD 3 presenting with generalized weakness and polyarthralgias.  Patient had a similar presentation when he was admitted to the hospital from 02/19/2021 to 02/21/2021 when he was treated for AKI.  He was subsequently sent to [facility] for STR where he felt like he improved to a point where he was able to ambulate with a cane and a walker.  However, the patient states that since he returned home on 03/07/2021, he has had a gradual decline with increasing generalized weakness with difficulty getting out of bed as well as his usual polyarthralgias involving his wrists, elbows, knees.  He denies any frank synovitis.  He denies any frank fevers, chills, headache, neck pain, visual disturbance.  There is been no dysuria or hematuria.  The patient was discharged from his last hospitalization with a prednisone taper for 8 days.  The patient states that he felt stronger and better during the prednisone course.  However after the course was finished, he felt like there was some regression regarding his strength and polyarthralgias.  He states that he was recently placed on some new medication by his PCP, but he does not now remember.  The patient has poor insight regarding his medications.  On 03/16/2021, the patient had 1 episode of nausea and vomiting.  He has had decreased oral  intake.  He denies any frank diarrhea, abdominal pain, hematochezia, melena.  Review of the medical record shows that the patient has had numerous ED visits for polyarthralgias.  On 03/16/2021 he had ESR 137, and CRP 26.9 on 02/19/21.  He also had a negative ANA and negative CCP and mildly positive RA on 02/19/2021. Upon EMS arrival, the patient was noted to be hypotensive and tachycardic.  In the ED, the patient was afebrile with soft blood pressures in the 90s.  He was tachycardic up to the 130-140 range.  Sodium 125, potassium 4.1, chloride 91, CO2 19, serum creatinine 1.35.  The patient was admitted for further evaluation and treatment of his dehydration and generalized weakness.  Assessment and Plan: * Hyponatremia- (present on admission) Secondary to volume depletion and poor solute intake Continue IV fluids>> improving Na = 138 on day of dc  Polyarthralgia- (present on admission) Current working diagnosis is polymyalgia rheumatica Certainly, consideration for mixed connective tissue disease is in the differential Patient will need referral to outpatient rheumatology after discharge Check ESR--125 CRP 27.2>>20.2>>9.8 on prednisone Anti ENA Anti SS-A and SS-B Check complements Check double-stranded DNA Check aldolase--3.3 CPK 40 Start empiric prednisone>>50 mg daily x 1 week, then 40 mg daily x 1 week (start 03/24/21), then 30 mg daily x 1 week, then 20 mg daily x 1 week, then 10 mg daily x 1 week  Acute renal failure superimposed on stage 3a chronic kidney disease ([place])- (present on admission) Baseline creatinine 1.0-1.3 Continue IV fluids Discontinue celecoxib Serum  creatinine 1.19 on day of dc  Generalized weakness Work-up as above W46--659 DJT--7.017 Folic BLTJ--03.0 Thiamine level pending UA negative for pyuria Check UDS--neg PT evaluation--SNF>>>pt refuses>>>d/c home with HHPT  Chronic combined systolic and diastolic CHF (congestive heart failure) ([place])- (present on  admission) 12/06/2020 echo EF 45-50%, G1DD, trivial MR, small pericardial effusion Clinically euvolemic Restart metoprolol succinate Restart lisinopril as blood pressure allows  Pressure ulcer with suspected deep tissue injury, stage I Local wound care  Coronary disease- (present on admission) No chest pain presently Continue aspirin  Essential hypertension Continue metoprolol succinate Holding lisinopril secondary to soft blood pressure>>restart  Hyperglycemia- (present on admission) Patient has impaired glucose tolerance 02/19/2021 hemoglobin A1c 6.2 Lifestyle modification          Consultants: none Procedures performed: none Disposition: Home Diet recommendation:  Carb modified diet  DISCHARGE MEDICATION: Allergies as of 03/18/2021       Reactions   Penicillins Shortness Of Breath, Itching, Swelling, Other (See Comments)   Has patient had a PCN reaction causing immediate rash, facial/tongue/throat swelling, SOB or lightheadedness with hypotension: Yes Has patient had a PCN reaction causing severe rash involving mucus membranes or skin necrosis: No Has patient had a PCN reaction that required hospitalization: No Has patient had a PCN reaction occurring within the last 10 years: No If all of the above answers are "NO", then may proceed with Cephalosporin use.   Shellfish Allergy Itching, Swelling        Medication List     STOP taking these medications    B-complex with vitamin C tablet   icosapent Ethyl 1 g capsule Commonly known as: Vascepa   oxyCODONE 5 MG immediate release tablet Commonly known as: Roxicodone   predniSONE 10 MG (21) Tbpk tablet Commonly known as: STERAPRED UNI-PAK 21 TAB Replaced by: predniSONE 10 MG tablet       TAKE these medications    acetaminophen 325 MG tablet Commonly known as: TYLENOL Take 2 tablets (650 mg total) by mouth every 6 (six) hours as needed for mild pain (or Fever >/= 101).   allopurinol 100 MG  tablet Commonly known as: ZYLOPRIM Take 100 mg by mouth daily.   amLODipine 2.5 MG tablet Commonly known as: NORVASC Take 2.5 mg by mouth daily. What changed: Another medication with the same name was removed. Continue taking this medication, and follow the directions you see here.   aspirin EC 81 MG tablet Take 1 tablet (81 mg total) by mouth daily. Swallow whole.   b complex vitamins capsule Take 1 capsule by mouth daily.   benzonatate 200 MG capsule Commonly known as: TESSALON Take 200 mg by mouth 3 (three) times daily as needed for cough.   diclofenac Sodium 1 % Gel Commonly known as: VOLTAREN Apply 2 g topically 4 (four) times daily.   feeding supplement Liqd Take 237 mLs by mouth 2 (two) times daily between meals.   Fish Oil 1000 MG Caps Take 2 capsules by mouth 2 (two) times daily.   HYDROcodone-acetaminophen 5-325 MG tablet Commonly known as: NORCO/VICODIN   lisinopril 5 MG tablet Commonly known as: ZESTRIL Take 1 tablet (5 mg total) by mouth every evening. What changed: when to take this   meloxicam 7.5 MG tablet Commonly known as: MOBIC   methocarbamol 750 MG tablet Commonly known as: ROBAXIN Take 750 mg by mouth 4 (four) times daily.   metoprolol succinate 25 MG 24 hr tablet Commonly known as: TOPROL-XL Take 1 tablet (25 mg total) by mouth  daily.   Nitrostat 0.4 MG SL tablet Generic drug: nitroGLYCERIN Place 0.4 mg under the tongue every 5 (five) minutes as needed.   ondansetron 8 MG disintegrating tablet Commonly known as: ZOFRAN-ODT Take 8 mg by mouth 2 (two) times daily as needed for nausea or vomiting.   pantoprazole 40 MG tablet Commonly known as: PROTONIX Take 1 tablet (40 mg total) by mouth 2 (two) times daily. TAKE 1 TABLET(40 MG) BY MOUTH TWICE DAILY BEFORE A MEAL Strength: 40 mg   predniSONE 10 MG tablet Commonly known as: DELTASONE Take 5 tablets (50 mg total) by mouth daily with breakfast. X 5 days, the 4 tablets (40 mg) daily on  03/24/21 and decrease by 1 tablet every 7 days Start taking on: March 19, 2021 Replaces: predniSONE 10 MG (21) Tbpk tablet   rosuvastatin 40 MG tablet Commonly known as: CRESTOR Take 1 tablet (40 mg total) by mouth daily.   tamsulosin 0.4 MG Caps capsule Commonly known as: FLOMAX Take 0.4 mg by mouth daily.   traMADol 50 MG tablet Commonly known as: ULTRAM Take 50 mg by mouth 2 (two) times daily.   Vitamin D3 125 MCG (5000 UT) Caps Take 5,000 Units by mouth daily.        Follow-up Information     Lahoma Rocker, MD Follow up in 1 month(s).   Specialty: Rheumatology Why: please call Dr. Teodoro Spray office for appointment Contact information: [address] [place] [address] [phone]                 Discharge Exam: Danley Danker Weights   03/16/21 1531 03/17/21 0246  Weight: 79.8 kg 74.3 kg   HEENT:  [place]/AT, No thrush, no icterus CV:  RRR, no rub, no S3, no S4 Lung:  CTA, no wheeze, no rhonchi Abd:  soft/+BS, NT Ext:  No edema, no lymphangitis, no synovitis, no rash Neuro:  CN II-XII intact, strength 4/5 in RUE, RLE, strength 4/5 LUE, LLE; sensation intact bilateral; no dysmetria; babinski equivocal   Condition at discharge: good  The results of significant diagnostics from this hospitalization (including imaging, microbiology, ancillary and laboratory) are listed below for reference.   Imaging Studies: CT ABDOMEN PELVIS WO CONTRAST  Result Date: 02/19/2021 CLINICAL DATA:  Weakness, lower back pain, hypotension, weight loss EXAM: CT ABDOMEN AND PELVIS WITHOUT CONTRAST TECHNIQUE: Multidetector CT imaging of the abdomen and pelvis was performed following the standard protocol without IV contrast. RADIATION DOSE REDUCTION: This exam was performed according to the departmental dose-optimization program which includes automated exposure control, adjustment of the mA and/or kV according to patient size and/or use of iterative reconstruction technique.  COMPARISON:  None. FINDINGS: Lower chest: There is subsegmental atelectasis in the lung bases. Coronary artery calcifications are noted. There is trace pericardial fluid. Hepatobiliary: There is a small calcified granuloma in the left hepatic lobe. The liver is otherwise unremarkable, within the confines of noncontrast technique. The gallbladder is unremarkable. There is no biliary ductal dilatation. Pancreas: Unremarkable. Spleen: Multiple calcified granulomas are seen in the spleen. The spleen is otherwise unremarkable. Adrenals/Urinary Tract: The adrenals are unremarkable. The kidneys are unremarkable, with no focal lesion, stone, hydronephrosis, or hydroureter. The bladder is unremarkable. Stomach/Bowel: The stomach is unremarkable there is no evidence of bowel obstruction. There is no abnormal bowel wall thickening or inflammatory change. The appendix is normal. Vascular/Lymphatic: There is scattered calcified atherosclerotic plaque throughout the nonaneurysmal abdominal aorta. There is no abdominopelvic lymphadenopathy. Reproductive: The prostate and seminal vesicles are unremarkable.  Other: There is no ascites or free air. Musculoskeletal: There is no acute osseous abnormality or aggressive osseous lesion. The lumbar spine is assessed in full on the separately dictated CT lumbar spine. IMPRESSION: 1. No acute findings in the abdomen or pelvis. No finding to explain the patient's presentation or weight loss. 2. Coronary artery calcifications and trace pericardial fluid. Electronically Signed   By: Valetta Mole M.D.   On: 02/19/2021 15:26   CT CHEST WO CONTRAST  Result Date: 02/19/2021 CLINICAL DATA:  Late loss, history of smoking and Barrett's esophagus EXAM: CT CHEST WITHOUT CONTRAST TECHNIQUE: Multidetector CT imaging of the chest was performed following the standard protocol without IV contrast. RADIATION DOSE REDUCTION: This exam was performed according to the departmental dose-optimization program  which includes automated exposure control, adjustment of the mA and/or kV according to patient size and/or use of iterative reconstruction technique. COMPARISON:  No prior chest CT. FINDINGS: Cardiovascular: Three-vessel aortic arch. Mild aortic atherosclerosis. The aorta appears to be within normal limits for size. The heart is normal in size. Small volume pericardial fluid. Severe coronary artery calcifications. Mediastinum/Nodes: Calcified bilateral hilar and mediastinal lymph nodes, likely sequela of prior granulomatous disease. Paraesophageal lymph nodes measure up to 7 mm in short axis (series 2, image 89 and 103). Prominent mediastinal lymph nodes measure up to 6 mm (series 2, image 54), but retain their normal reniform morphology. Evaluation of hilar lymph nodes is somewhat limited in the absence of intravenous contrast. The thyroid gland is unremarkable. The esophagus and trachea demonstrate no significant abnormality. Lungs/Pleura: Apical predominant centrilobular emphysema. Dependent atelectasis. No focal pulmonary opacity. Multiple tiny calcified nodules, likely sequela of prior granulomatous disease. No suspicious pulmonary nodule. Upper Abdomen: Please see same-day CT abdomen pelvis. Musculoskeletal: Degenerative changes in the thoracic spine. No acute osseous abnormality. IMPRESSION: 1. Prominent paraesophageal lymph nodes, which measure up to 7 mm. No other definite lymphadenopathy, although evaluation of hilar lymph nodes is limited by the absence of intravenous contrast. 2. No focal pulmonary opacity or definite neoplasm. 3. Small volume pericardial fluid. 4. Severe coronary artery calcifications. Mild aortic atherosclerosis (ICD10-I70.0). 5. For findings in the abdomen please see same-day CT abdomen pelvis. Electronically Signed   By: Merilyn Baba M.D.   On: 02/19/2021 17:17   [place] LIMITED JOINT SPACE STRUCTURES LOW LEFT  Result Date: 02/20/2021 CLINICAL DATA:  LEFT knee joint effusion,  question gout EXAM: ULTRASOUND LEFT LOWER EXTREMITY LIMITED TECHNIQUE: Ultrasound examination of the lower extremity soft tissues was performed in the area of clinical concern, the LEFT knee joint. COMPARISON:  LEFT knee radiographs 02/19/2021 FINDINGS: Minimal joint effusion at suprapatellar recess. No additional joint fluid identified. No adequate pocket of fluid is seen to allow for arthrocentesis. IMPRESSION: Minimal joint effusion, insufficient for arthrocentesis. Electronically Signed   By: Lavonia Dana M.D.   On: 02/20/2021 11:29   CT L-SPINE NO CHARGE  Result Date: 02/19/2021 CLINICAL DATA:  Low back pain, weight loss weakness, hypotension EXAM: CT LUMBAR SPINE WITHOUT CONTRAST TECHNIQUE: Multidetector CT imaging of the lumbar spine was performed without intravenous contrast administration. Multiplanar CT image reconstructions were also generated. RADIATION DOSE REDUCTION: This exam was performed according to the departmental dose-optimization program which includes automated exposure control, adjustment of the mA and/or kV according to patient size and/or use of iterative reconstruction technique. COMPARISON:  Lumbar spine radiographs 08/27/2014 FINDINGS: Segmentation: Standard; the lowest formed disc space is designated L5-S1. Alignment: There is trace retrolisthesis of L1 on L2 and L2 on L3,  not significantly changed since 2016. There is mild dextrocurvature centered at L2-L3. Vertebrae: Vertebral body heights are preserved. There is no evidence of acute injury. Densely sclerotic lesions in the L1 and L4 vertebral bodies most likely reflect bone islands (average and max attenuations at L1 are 938 and 1,409 Hounsfield units, respectively, and 968 and 1,268 Hounsfield units at L4, respectively. There is a prominent Schmorl's node indenting the superior L3 endplate. There is a lucent lesion without aggressive features in the right iliac bone. There are no suspicious osseous lesions. Paraspinal and other  soft tissues: The paraspinal soft tissues are unremarkable. The abdominal and pelvic viscera are assessed on the separately dictated CT abdomen/pelvis. Disc levels: There is marked intervertebral disc space narrowing at L2-L3. There is more mild disc space narrowing at the remaining levels. There is vacuum disc phenomenon at L5-S1. T12-L1: No significant spinal canal or neural foraminal stenosis. L1-L2: Mild degenerative endplate change and facet arthropathy without significant spinal canal or neural foraminal stenosis. L2-L3: There is degenerative endplate change and mild bilateral facet arthropathy with grade 1 retrolisthesis resulting in mild spinal canal stenosis without significant neural foraminal stenosis. L3-L4: There is a mild disc bulge and mild bilateral facet arthropathy without significant spinal canal or neural foraminal stenosis. L4-L5: There is a mild disc bulge, ligamentum flavum thickening, and moderate right worse than left facet arthropathy resulting in mild spinal canal stenosis without significant neural foraminal stenosis. L5-S1: There is a mild disc bulge and moderate right worse than left facet arthropathy without significant spinal canal or neural foraminal stenosis. IMPRESSION: 1. No evidence of acute injury in the lumbar spine. 2. Grade 1 retrolisthesis of L1 on L2 and L2 on L3, not significantly changed since 2016. Mild dextrocurvature centered at L2-L3. 3. Marked intervertebral disc space narrowing at L2-L3. 4. Multilevel facet arthropathy, most advanced on the right at L4-L5 and L5-S1. 5. No high-grade spinal canal or neural foraminal stenosis. 6.  Aortic Atherosclerosis (ICD10-I70.0). Electronically Signed   By: Valetta Mole M.D.   On: 02/19/2021 15:34   DG Chest Port 1 View  Result Date: 02/19/2021 CLINICAL DATA:  Short of breath and weakness EXAM: PORTABLE CHEST 1 VIEW COMPARISON:  01/24/2021 FINDINGS: The heart size and mediastinal contours are within normal limits. Both lungs  are clear. The visualized skeletal structures are unremarkable. IMPRESSION: No active disease. Electronically Signed   By: Franchot Gallo M.D.   On: 02/19/2021 14:57   DG Knee 3 Views Left  Result Date: 02/19/2021 CLINICAL DATA:  Left knee pain. EXAM: LEFT KNEE - 3 VIEW COMPARISON:  01/18/2021 FINDINGS: There appears to be resolution of the prior moderate knee joint effusion. Mild superior patellar degenerative osteophytosis, unchanged. Mild medial and lateral patellar osteophytosis. The medial and lateral compartment joint spaces are maintained. Possible minimal patellofemoral joint space narrowing. No acute fracture or dislocation. IMPRESSION:: IMPRESSION: 1. Resolution of the prior joint effusion. 2. Mild patellofemoral osteoarthritis. Electronically Signed   By: Yvonne Kendall M.D.   On: 02/19/2021 16:50    Microbiology: Results for orders placed or performed during the hospital encounter of 03/16/21  Resp Panel by RT-PCR (Flu A&B, Covid) Nasopharyngeal Swab     Status: None   Collection Time: 03/17/21 12:44 AM   Specimen: Nasopharyngeal Swab; Nasopharyngeal(NP) swabs in vial transport medium  Result Value Ref Range Status   SARS Coronavirus 2 by RT PCR NEGATIVE NEGATIVE Final    Comment: (NOTE) SARS-CoV-2 target nucleic acids are NOT DETECTED.  The SARS-CoV-2 RNA is  generally detectable in upper respiratory specimens during the acute phase of infection. The lowest concentration of SARS-CoV-2 viral copies this assay can detect is 138 copies/mL. A negative result does not preclude SARS-Cov-2 infection and should not be used as the sole basis for treatment or other patient management decisions. A negative result may occur with  improper specimen collection/handling, submission of specimen other than nasopharyngeal swab, presence of viral mutation(s) within the areas targeted by this assay, and inadequate number of viral copies(<138 copies/mL). A negative result must be combined  with clinical observations, patient history, and epidemiological information. The expected result is Negative.  Fact Sheet for Patients:  EntrepreneurPulse.com.au  Fact Sheet for Healthcare Providers:  IncredibleEmployment.be  This test is no t yet approved or cleared by the [place] FDA and  has been authorized for detection and/or diagnosis of SARS-CoV-2 by FDA under an Emergency Use Authorization (EUA). This EUA will remain  in effect (meaning this test can be used) for the duration of the COVID-19 declaration under Section 564(b)(1) of the Act, 21 U.S.C.section 360bbb-3(b)(1), unless the authorization is terminated  or revoked sooner.       Influenza A by PCR NEGATIVE NEGATIVE Final   Influenza B by PCR NEGATIVE NEGATIVE Final    Comment: (NOTE) The Xpert Xpress SARS-CoV-2/FLU/RSV plus assay is intended as an aid in the diagnosis of influenza from Nasopharyngeal swab specimens and should not be used as a sole basis for treatment. Nasal washings and aspirates are unacceptable for Xpert Xpress SARS-CoV-2/FLU/RSV testing.  Fact Sheet for Patients: EntrepreneurPulse.com.au  Fact Sheet for Healthcare Providers: IncredibleEmployment.be  This test is not yet approved or cleared by the [place] FDA and has been authorized for detection and/or diagnosis of SARS-CoV-2 by FDA under an Emergency Use Authorization (EUA). This EUA will remain in effect (meaning this test can be used) for the duration of the COVID-19 declaration under Section 564(b)(1) of the Act, 21 U.S.C. section 360bbb-3(b)(1), unless the authorization is terminated or revoked.  Performed at [facility], [address], [place], [place] [address]     Labs: CBC: Recent Labs  Lab 03/16/21 1610 03/18/21 0528  WBC 7.4 7.2  NEUTROABS 6.0  --   HGB 10.1* 7.6*  HCT 31.6* 24.2*  MCV 81.7 82.9  PLT 480* 540   Basic Metabolic  Panel: Recent Labs  Lab 03/16/21 1610 03/17/21 0515 03/18/21 0528  NA 125* 130* 138  K 4.1 4.4 3.8  CL 91* 97* 106  CO2 19* 22 21*  GLUCOSE 137* 186* 240*  BUN 23 34* 34*  CREATININE 1.35* 1.49* 1.19  CALCIUM 9.2 8.9 8.8*  MG 1.7  --  1.8  PHOS 3.4  --   --    Liver Function Tests: Recent Labs  Lab 03/16/21 1610  AST 29  ALT 24  ALKPHOS 111  BILITOT 0.7  PROT 7.9  ALBUMIN 2.4*   CBG: No results for input(s): GLUCAP in the last 168 hours.  Discharge time spent: greater than 30 minutes.  Signed: Orson Eva, MD [facility] 03/18/2021

## 2021-03-18 NOTE — Plan of Care (Signed)
°  Problem: Acute Rehab PT Goals(only PT should resolve) Goal: Pt Will Go Supine/Side To Sit Outcome: Progressing Flowsheets (Taken 03/18/2021 1231) Pt will go Supine/Side to Sit: with min guard assist Goal: Patient Will Transfer Sit To/From Stand Outcome: Progressing Flowsheets (Taken 03/18/2021 1231) Patient will transfer sit to/from stand: with minimal assist Goal: Pt Will Transfer Bed To Chair/Chair To Bed Outcome: Progressing Flowsheets (Taken 03/18/2021 1231) Pt will Transfer Bed to Chair/Chair to Bed: with min assist Goal: Pt Will Ambulate Outcome: Progressing Flowsheets (Taken 03/18/2021 1231) Pt will Ambulate:  25 feet  with minimal assist  with moderate assist  with rolling walker   12:31 PM, 03/18/21 [name], MPT Physical Therapist with [facility] [phone] office [phone] mobile phone

## 2021-03-18 NOTE — Evaluation (Signed)
Physical Therapy Evaluation Patient Details Name: Phillip Pace MRN: [id number] DOB: [birth year] Today's Date: 03/18/2021  History of Present Illness  Phillip Pace is a 76 y.o. 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 He has had multiple ED visits and several admissions for polyartrathalgias of unknown origin with a Jan admission during which time he had CT scans and multiple labs notable for elevated Sed Rate and CRP, LA latex turbid of 18. He was advised to see a rheumatologist but has not been able to do so yet. For several days his arthralgias have been worse limiting ambulation and use of his arms. He as felt increasingly weak and has had poor PO intake. He presents to [facility] for evaluation and treatment   Clinical Impression  Patient demonstrates slow labored movement for sitting up at bedside with c/o increasing low back pain, very unsteady on feet and limited to a few steps at bedside before having to sit due to generalized weakness and fall risk.  Patient tolerated sitting up in chair after therapy.  Patient will benefit from continued skilled physical therapy in hospital and recommended venue below to increase strength, balance, endurance for safe ADLs and gait.          Recommendations for follow up therapy are one component of a multi-disciplinary discharge planning process, led by the attending physician.  Recommendations may be updated based on patient status, additional functional criteria and insurance authorization.  Follow Up Recommendations Skilled nursing-short term rehab (<3 hours/day)    Assistance Recommended at Discharge Set up Supervision/Assistance  Patient can return home with the following  A lot of help with bathing/dressing/bathroom;A lot of help with walking and/or transfers;Help with stairs or ramp for  entrance;Assistance with cooking/housework    Equipment Recommendations None recommended by PT  Recommendations for Other Services       Functional Status Assessment Patient has had a recent decline in their functional status and demonstrates the ability to make significant improvements in function in a reasonable and predictable amount of time.     Precautions / Restrictions Precautions Precautions: Fall Restrictions Weight Bearing Restrictions: No      Mobility  Bed Mobility Overal bed mobility: Needs Assistance Bed Mobility: Sidelying to Sit, Rolling Rolling: Min guard Sidelying to sit: Min assist       General bed mobility comments: increased time, labored movement    Transfers Overall transfer level: Needs assistance Equipment used: Rolling walker (2 wheels) Transfers: Sit to/from Stand, Bed to chair/wheelchair/BSC Sit to Stand: Mod assist   Step pivot transfers: Mod assist       General transfer comment: slow labored unsteady movement    Ambulation/Gait Ambulation/Gait assistance: Mod assist Gait Distance (Feet): 5 Feet Assistive device: Rolling walker (2 wheels) Gait Pattern/deviations: Decreased step length - right, Decreased step length - left, Decreased stride length Gait velocity: decreased     General Gait Details: limited to a few slow labored unsteady side steps due to BLE weakness and fatigue  Stairs            Wheelchair Mobility    Modified Rankin (Stroke Patients Only)       Balance Overall balance assessment: Needs assistance Sitting-balance support: Feet supported, No upper extremity supported Sitting balance-Leahy Scale: Fair Sitting balance - Comments: fair/good seated at EOB   Standing balance support: During functional activity, Reliant on assistive  device for balance, Bilateral upper extremity supported Standing balance-Leahy Scale: Poor Standing balance comment: fair/poor using RW                              Pertinent Vitals/Pain Pain Assessment Pain Assessment: Faces Faces Pain Scale: Hurts little more Pain Location: low back with movement Pain Descriptors / Indicators: Sore, Grimacing Pain Intervention(s): Limited activity within patient's tolerance, Monitored during session, Repositioned    Home Living Family/patient expects to be discharged to:: Private residence   Available Help at Discharge: Friend(s);Available PRN/intermittently Type of Home: House Home Access: Stairs to enter Entrance Stairs-Rails: None Entrance Stairs-Number of Steps: 2   Home Layout: Able to live on main level with bedroom/bathroom;Two level Home Equipment: Wheelchair - Publishing copy (2 wheels);Cane - single point      Prior Function Prior Level of Function : Independent/Modified Independent;Driving             Mobility Comments: Community ambulator without AD, drives ADLs Comments: Independent     Hand Dominance   Dominant Hand: Right    Extremity/Trunk Assessment   Upper Extremity Assessment Upper Extremity Assessment: Generalized weakness    Lower Extremity Assessment Lower Extremity Assessment: Generalized weakness    Cervical / Trunk Assessment Cervical / Trunk Assessment: Normal  Communication   Communication: No difficulties  Cognition Arousal/Alertness: Awake/alert Behavior During Therapy: WFL for tasks assessed/performed Overall Cognitive Status: Within Functional Limits for tasks assessed                                          General Comments      Exercises     Assessment/Plan    PT Assessment Patient needs continued PT services  PT Problem List Decreased strength;Decreased activity tolerance;Decreased balance;Decreased mobility       PT Treatment Interventions DME instruction;Stair training;Functional mobility training;Therapeutic activities;Therapeutic exercise;Patient/family education;Balance training;Gait training    PT Goals  (Current goals can be found in the Care Plan section)  Acute Rehab PT Goals Patient Stated Goal: return home PT Goal Formulation: With patient Time For Goal Achievement: 04/01/21 Potential to Achieve Goals: Good    Frequency Min 3X/week     Co-evaluation               AM-PAC PT "6 Clicks" Mobility  Outcome Measure Help needed turning from your back to your side while in a flat bed without using bedrails?: A Little Help needed moving from lying on your back to sitting on the side of a flat bed without using bedrails?: A Little Help needed moving to and from a bed to a chair (including a wheelchair)?: A Lot Help needed standing up from a chair using your arms (e.g., wheelchair or bedside chair)?: A Lot Help needed to walk in hospital room?: A Lot Help needed climbing 3-5 steps with a railing? : Total 6 Click Score: 13    End of Session   Activity Tolerance: Patient tolerated treatment well;Patient limited by fatigue Patient left: in chair;with call bell/phone within reach Nurse Communication: Mobility status PT Visit Diagnosis: Unsteadiness on feet (R26.81);Other abnormalities of gait and mobility (R26.89);Muscle weakness (generalized) (M62.81)    Time: 7867-6720 PT Time Calculation (min) (ACUTE ONLY): 30 min   Charges:   PT Evaluation $PT Eval Moderate Complexity: 1 Mod PT Treatments $Therapeutic Activity: 23-37 mins  12:29 PM, 03/18/21 [name], MPT Physical Therapist with [facility] [phone] office [phone] mobile phone

## 2021-03-19 LAB — RMSF, IGG, IFA: RMSF, IGG, IFA: <1:64 {titer}

## 2021-03-19 LAB — LYME DISEASE DNA BY PCR(BORRELIA BURG): Lyme Disease(B.burgdorferi)PCR: NEGATIVE

## 2021-03-19 LAB — COMPLEMENT, TOTAL: Compl, Total (CH50): >60 U/mL (ref >41)

## 2021-03-19 LAB — ROCKY MTN SPOTTED FVR ABS PNL(IGG+IGM)
RMSF IgG: UNDETERMINED
RMSF IgM: 0.71 index (ref 0.00–0.89)

## 2021-03-20 LAB — VITAMIN B1: Vitamin B1 (Thiamine): 171.6 nmol/L (ref 66.5–200.0)

## 2021-05-07 DIAGNOSIS — H5210 Myopia, unspecified eye: Secondary | ICD-10-CM | POA: Diagnosis not present

## 2021-05-07 DIAGNOSIS — Z0100 Encounter for examination of eyes and vision without abnormal findings: Secondary | ICD-10-CM | POA: Diagnosis not present

## 2021-05-27 ENCOUNTER — Ambulatory Visit: Payer: Self-pay | Admitting: *Deleted

## 2021-05-27 ENCOUNTER — Other Ambulatory Visit (HOSPITAL_COMMUNITY): Payer: Self-pay | Admitting: Family Medicine

## 2021-05-27 DIAGNOSIS — R202 Paresthesia of skin: Secondary | ICD-10-CM | POA: Diagnosis not present

## 2021-05-27 NOTE — Telephone Encounter (Signed)
?  Chief Complaint: chills and low BP ?Symptoms: chills and BP 110/49. C/o feet freezing up to knees. Rechecked BP 118/60 HR 91. Reports feeling sx every evening from 3-7p. Reports taking multiple antihypertensive medications. Has had dizziness at times not now.  ?Frequency: today  ?Pertinent Negatives: Patient denies chest pain , no difficulty breathing, no fever. No dizziness or lightheadedness, no feelings of passing out  ?Disposition: '[]'$ ED /'[]'$ Urgent Care (no appt availability in office) / '[]'$ Appointment(In office/virtual)/ '[]'$  [facility]/ '[]'$ Home Care/ '[]'$ Refused Recommended Disposition /'[]'$ [facility]/ '[x]'$  Follow-up with PCP ?Additional Notes:  ? ?Recommended to contact PCP in am and to be seen within 24 hours for sx. And medication management. If symptoms worsen or BP drops lower than 902 systolic go to ED. Patient verbalized understanding . ? ? Reason for Disposition ? [4] Systolic BP 09-735 AND [3] taking blood pressure medications AND [3] NOT dizzy, lightheaded or weak ? ?Answer Assessment - Initial Assessment Questions ?1. BLOOD PRESSURE: "What is the blood pressure?" "Did you take at least two measurements 5 minutes apart?" ?    110/49 and rechecked for 118/60 HR 91 ?2. ONSET: "When did you take your blood pressure?" ?    Prior to call and now  ?3. HOW: "How did you obtain the blood pressure?" (e.g., visiting nurse, automatic home BP monitor) ?    Home BP monitor  ?4. HISTORY: "Do you have a history of low blood pressure?" "What is your blood pressure normally?" ?   No  ?5. MEDICATIONS: "Are you taking any medications for blood pressure?" If Yes, ask: "Have they been changed recently?" ?    Yes multiple antihypertensive medications ?6. PULSE RATE: "Do you know what your pulse rate is?"  ?    91 ?7. OTHER SYMPTOMS: "Have you been sick recently?" "Have you had a recent injury?" ?    Denies  ?8. PREGNANCY: "Is there any chance you are pregnant?" "When was your last menstrual period?" ?     na ? ?Protocols used: Blood Pressure - Low-A-AH ? ?

## 2021-05-30 ENCOUNTER — Ambulatory Visit (HOSPITAL_COMMUNITY)
Admission: RE | Admit: 2021-05-30 | Discharge: 2021-05-30 | Disposition: A | Discharge status: Home / Self Care | Payer: Medicare HMO | Source: Ambulatory Visit | Attending: Family Medicine | Admitting: Family Medicine

## 2021-05-30 DIAGNOSIS — R200 Anesthesia of skin: Secondary | ICD-10-CM | POA: Diagnosis not present

## 2021-05-30 DIAGNOSIS — R202 Paresthesia of skin: Secondary | ICD-10-CM | POA: Insufficient documentation

## 2021-06-01 ENCOUNTER — Encounter (HOSPITAL_COMMUNITY): Payer: Self-pay

## 2021-06-01 ENCOUNTER — Emergency Department (HOSPITAL_COMMUNITY): Payer: Medicare HMO

## 2021-06-01 ENCOUNTER — Other Ambulatory Visit: Payer: Self-pay

## 2021-06-01 ENCOUNTER — Emergency Department (HOSPITAL_COMMUNITY)
Admission: EM | Admit: 2021-06-01 | Discharge: 2021-06-01 | Disposition: A | Discharge status: Home / Self Care | Payer: Medicare HMO | Attending: Emergency Medicine | Admitting: Emergency Medicine

## 2021-06-01 DIAGNOSIS — I110 Hypertensive heart disease with heart failure: Secondary | ICD-10-CM | POA: Insufficient documentation

## 2021-06-01 DIAGNOSIS — M7989 Other specified soft tissue disorders: Secondary | ICD-10-CM | POA: Diagnosis not present

## 2021-06-01 DIAGNOSIS — I129 Hypertensive chronic kidney disease with stage 1 through stage 4 chronic kidney disease, or unspecified chronic kidney disease: Secondary | ICD-10-CM | POA: Diagnosis not present

## 2021-06-01 DIAGNOSIS — W1839XA Other fall on same level, initial encounter: Secondary | ICD-10-CM | POA: Insufficient documentation

## 2021-06-01 DIAGNOSIS — Z7982 Long term (current) use of aspirin: Secondary | ICD-10-CM | POA: Insufficient documentation

## 2021-06-01 DIAGNOSIS — M25572 Pain in left ankle and joints of left foot: Secondary | ICD-10-CM | POA: Diagnosis not present

## 2021-06-01 DIAGNOSIS — M25571 Pain in right ankle and joints of right foot: Secondary | ICD-10-CM | POA: Diagnosis not present

## 2021-06-01 DIAGNOSIS — Z79899 Other long term (current) drug therapy: Secondary | ICD-10-CM | POA: Diagnosis not present

## 2021-06-01 DIAGNOSIS — M79672 Pain in left foot: Secondary | ICD-10-CM | POA: Diagnosis not present

## 2021-06-01 DIAGNOSIS — J449 Chronic obstructive pulmonary disease, unspecified: Secondary | ICD-10-CM | POA: Diagnosis not present

## 2021-06-01 DIAGNOSIS — I2510 Atherosclerotic heart disease of native coronary artery without angina pectoris: Secondary | ICD-10-CM | POA: Diagnosis not present

## 2021-06-01 DIAGNOSIS — I509 Heart failure, unspecified: Secondary | ICD-10-CM | POA: Diagnosis not present

## 2021-06-01 DIAGNOSIS — Z7952 Long term (current) use of systemic steroids: Secondary | ICD-10-CM | POA: Diagnosis not present

## 2021-06-01 DIAGNOSIS — N1831 Chronic kidney disease, stage 3a: Secondary | ICD-10-CM | POA: Diagnosis not present

## 2021-06-01 DIAGNOSIS — W19XXXA Unspecified fall, initial encounter: Secondary | ICD-10-CM

## 2021-06-01 DIAGNOSIS — E782 Mixed hyperlipidemia: Secondary | ICD-10-CM | POA: Diagnosis not present

## 2021-06-01 DIAGNOSIS — M79671 Pain in right foot: Secondary | ICD-10-CM | POA: Diagnosis not present

## 2021-06-01 NOTE — ED Notes (Signed)
Back from xray, no changes. Wife and therapy dog remain at [facility].  ?

## 2021-06-01 NOTE — ED Triage Notes (Signed)
Patient reports fall 1.5 weeks ago with bilateral foot pain with left worse than right. ?

## 2021-06-01 NOTE — ED Notes (Signed)
ED PA at [facility] 

## 2021-06-01 NOTE — ED Provider Notes (Signed)
Due to ?[facility] ?Provider Note ? ? ?CSN: [id number] ?Arrival date & time: 06/01/21  3154 ? ?  ? ?History ? ?Chief Complaint  ?Patient presents with  ? Fall  ? ? ?Phillip Pace is a 76 y.o. male. ? ? ?Fall ? ? ?Patient with medical history of hypertension, hyperlipidemia, CAD, COPD, arthritis presents today due to bilateral foot and ankle pain x1 and half weeks.  Patient states he had a fall about a week and a half ago, did not hit his head or lose consciousness.  States he has been having pain in both his feet and ankles since then which is worse with any ambulation or movement.  Pain is worse on the right side primarily, has a pins-and-needles sensation in both bilaterally. ? ?Home Medications ?Prior to Admission medications   ?Medication Sig Start Date End Date Taking? Authorizing Provider  ?acetaminophen (TYLENOL) 325 MG tablet Take 2 tablets (650 mg total) by mouth every 6 (six) hours as needed for mild pain (or Fever >/= 101). 02/21/21   Barton Dubois, MD  ?allopurinol (ZYLOPRIM) 100 MG tablet Take 100 mg by mouth daily.    [provider]  ?amLODipine (NORVASC) 2.5 MG tablet Take 2.5 mg by mouth daily.    [provider]  ?aspirin EC 81 MG tablet Take 1 tablet (81 mg total) by mouth daily. Swallow whole. 11/06/20   Sueanne Margarita, MD  ?b complex vitamins capsule Take 1 capsule by mouth daily.    [provider]  ?benzonatate (TESSALON) 200 MG capsule Take 200 mg by mouth 3 (three) times daily as needed for cough.    [provider]  ?Cholecalciferol (VITAMIN D3) 5000 units CAPS Take 5,000 Units by mouth daily.    [provider]  ?diclofenac Sodium (VOLTAREN) 1 % GEL Apply 2 g topically 4 (four) times daily.    [provider]  ?feeding supplement (ENSURE ENLIVE / ENSURE PLUS) LIQD Take 237 mLs by mouth 2 (two) times daily between meals. 02/21/21   Barton Dubois, MD  ?HYDROcodone-acetaminophen (NORCO/VICODIN) 5-325 MG tablet  03/14/21    [provider]  ?lisinopril (ZESTRIL) 5 MG tablet Take 1 tablet (5 mg total) by mouth every evening. ?Patient taking differently: Take 5 mg by mouth daily. 02/27/21   Barton Dubois, MD  ?meloxicam (MOBIC) 7.5 MG tablet     [provider]  ?methocarbamol (ROBAXIN) 750 MG tablet Take 750 mg by mouth 4 (four) times daily.    [provider]  ?metoprolol succinate (TOPROL-XL) 25 MG 24 hr tablet Take 1 tablet (25 mg total) by mouth daily. 11/06/20   Sueanne Margarita, MD  ?NITROSTAT 0.4 MG SL tablet Place 0.4 mg under the tongue every 5 (five) minutes as needed. 07/06/14   [provider]  ?Omega-3 Fatty Acids (FISH OIL) 1000 MG CAPS Take 2 capsules by mouth 2 (two) times daily.    [provider]  ?ondansetron (ZOFRAN-ODT) 8 MG disintegrating tablet Take 8 mg by mouth 2 (two) times daily as needed for nausea or vomiting.    [provider]  ?pantoprazole (PROTONIX) 40 MG tablet Take 1 tablet (40 mg total) by mouth 2 (two) times daily. TAKE 1 TABLET(40 MG) BY MOUTH TWICE DAILY BEFORE A MEAL ?Strength: 40 mg 02/21/21   Barton Dubois, MD  ?predniSONE (DELTASONE) 10 MG tablet Take 5 tablets (50 mg total) by mouth daily with breakfast. X 5 days, the 4 tablets (40 mg) daily on 03/24/21 and decrease  by 1 tablet every 7 days 03/19/21   Tat, Shanon Brow, MD  ?rosuvastatin (CRESTOR) 40 MG tablet Take 1 tablet (40 mg total) by mouth daily. 11/13/20 03/16/21  Sueanne Margarita, MD  ?tamsulosin (FLOMAX) 0.4 MG CAPS capsule Take 0.4 mg by mouth daily.  11/25/15   [provider]  ?traMADol (ULTRAM) 50 MG tablet Take 50 mg by mouth 2 (two) times daily.    [provider]  ?   ? ?Allergies    ?Penicillins and Shellfish allergy   ? ?Review of Systems   ?Review of Systems ? ?Physical Exam ?Updated Vital Signs ?BP 130/81   Pulse 97   Temp 98 ?F (36.7 ?C) (Oral)   Resp 18   Ht '6\' 3"'$  (1.905 m)   Wt 74.3 kg   SpO2 100%   BMI 20.47 kg/m?  ?Physical Exam ?Vitals and nursing  note reviewed. Exam conducted with a chaperone present.  ?Constitutional:   ?   General: He is not in acute distress. ?   Appearance: Normal appearance.  ?HENT:  ?   Head: Normocephalic and atraumatic.  ?Eyes:  ?   General: No scleral icterus. ?   Extraocular Movements: Extraocular movements intact.  ?   Pupils: Pupils are equal, round, and reactive to light.  ?Cardiovascular:  ?   Comments: Right foot DP 1+.  Left DP palpable with Doppler. ?Musculoskeletal:     ?   General: Tenderness present. No swelling.  ?   Comments: Able to rotate ankles inward and outward.  Pain with plantarflexion and dorsiflexion bilaterally.  No crepitus, no specific point tenderness to the dorsum of foot or over the medial or lateral malleolus.  ?Skin: ?   Capillary Refill: Capillary refill takes less than 2 seconds.  ?   Coloration: Skin is not jaundiced.  ?Neurological:  ?   Mental Status: He is alert. Mental status is at baseline.  ?   Coordination: Coordination normal.  ? ? ?ED Results / Procedures / Treatments   ?Labs ?(all labs ordered are listed, but only abnormal results are displayed) ?Labs Reviewed - No data to display ? ?EKG ?None ? ?Radiology ?DG Ankle Complete Left ? ?Result Date: 06/01/2021 ?CLINICAL DATA:  Fall.  Pain. EXAM: LEFT ANKLE COMPLETE - 3+ VIEW COMPARISON:  None. FINDINGS: There is no evidence of fracture, dislocation, or joint effusion. There is no evidence of arthropathy or other focal bone abnormality. Soft tissues are unremarkable. IMPRESSION: Negative. Electronically Signed   By: Misty Stanley M.D.   On: 06/01/2021 10:51  ? ?DG Ankle Complete Right ? ?Result Date: 06/01/2021 ?CLINICAL DATA:  Fall BILATERAL foot pain greater on LEFT EXAM: RIGHT ANKLE - COMPLETE 3+ VIEW COMPARISON:  None FINDINGS: Soft tissue swelling RIGHT ankle. Low normal osseous mineralization. Joint spaces preserved. No acute fracture, dislocation, or bone destruction. IMPRESSION: No acute osseous abnormalities. Electronically Signed   By:  Lavonia Dana M.D.   On: 06/01/2021 10:52  ? ?DG Foot Complete Left ? ?Result Date: 06/01/2021 ?CLINICAL DATA:  Fall.  Pain. EXAM: LEFT FOOT - COMPLETE 3+ VIEW COMPARISON:  None. FINDINGS: There is no evidence of fracture or dislocation. There is no evidence of arthropathy or other focal bone abnormality. Soft tissues are unremarkable. IMPRESSION: Negative. Electronically Signed   By: Misty Stanley M.D.   On: 06/01/2021 10:52  ? ?DG Foot Complete Right ? ?Result Date: 06/01/2021 ?CLINICAL DATA:  Status post fall with right foot pain. EXAM: RIGHT FOOT COMPLETE - 3+ VIEW COMPARISON:  None. FINDINGS: Three view study. No fracture. No subluxation. Degenerative changes noted in the MTP joint of the great toe. Bones are diffusely demineralized. IMPRESSION: No acute bony abnormality. Electronically Signed   By: Misty Stanley M.D.   On: 06/01/2021 10:51   ? ?Procedures ?Procedures  ? ? ?Medications Ordered in ED ?Medications - No data to display ? ?ED Course/ Medical Decision Making/ A&P ?  ?                        ?Medical Decision Making ?Amount and/or Complexity of Data Reviewed ?Radiology: ordered. ? ? ?[name] a 76 year old with history of hypertension, gout, CAD, COPD, CHF presenting today with bilateral ankle and foot pain x1 and half weeks.  Differential diagnosis includes but is not limited to fracture, dislocation, sprain, musculoskeletal injury, gout, septic joint, cellulitis, ischemic foot. ? ?Physical exam: Patient tolerates passive ROM, slightly decreased active flexion extension secondary to pain.  Pulses are intact bilaterally, cap refill is brisk.  Skin is warm, no erythema or necrotic findings.  No ulcers visualized. ? ?Patient's wife is at bedside providing independent history.  I also reviewed external records including ABI obtained 05/30/2021 which showed bilateral lower extremities within normal limits of the segmental exam performed at the ankles. ? ?I ordered, viewed and interpreted the x-rays of  bilateral ankle and feet.  Agree with radiologist interpretation of no acute findings. ? ?I considered but based on physical exam and work-up think unlikely ischemic foot, gout, septic joint.  Patient encouraged to

## 2021-06-01 NOTE — Discharge Instructions (Signed)
Your x-rays are reassuring, no signs of fracture or dislocation.  Follow-up with your primary this week for reevaluation, take Tylenol and Motrin for pain.  Return if your symptoms change or worsen. ?

## 2021-06-11 DIAGNOSIS — M810 Age-related osteoporosis without current pathological fracture: Secondary | ICD-10-CM | POA: Diagnosis not present

## 2021-06-11 DIAGNOSIS — M1990 Unspecified osteoarthritis, unspecified site: Secondary | ICD-10-CM | POA: Diagnosis not present

## 2021-06-11 DIAGNOSIS — R700 Elevated erythrocyte sedimentation rate: Secondary | ICD-10-CM | POA: Diagnosis not present

## 2021-06-11 DIAGNOSIS — M79643 Pain in unspecified hand: Secondary | ICD-10-CM | POA: Diagnosis not present

## 2021-06-11 DIAGNOSIS — M2550 Pain in unspecified joint: Secondary | ICD-10-CM | POA: Diagnosis not present

## 2021-06-11 DIAGNOSIS — Z79899 Other long term (current) drug therapy: Secondary | ICD-10-CM | POA: Diagnosis not present

## 2021-06-11 DIAGNOSIS — N1831 Chronic kidney disease, stage 3a: Secondary | ICD-10-CM | POA: Diagnosis not present

## 2021-06-11 DIAGNOSIS — M0579 Rheumatoid arthritis with rheumatoid factor of multiple sites without organ or systems involvement: Secondary | ICD-10-CM | POA: Diagnosis not present

## 2021-06-11 DIAGNOSIS — M7989 Other specified soft tissue disorders: Secondary | ICD-10-CM | POA: Diagnosis not present

## 2021-07-02 DIAGNOSIS — N1831 Chronic kidney disease, stage 3a: Secondary | ICD-10-CM | POA: Diagnosis not present

## 2021-07-02 DIAGNOSIS — E782 Mixed hyperlipidemia: Secondary | ICD-10-CM | POA: Diagnosis not present

## 2021-07-02 DIAGNOSIS — I129 Hypertensive chronic kidney disease with stage 1 through stage 4 chronic kidney disease, or unspecified chronic kidney disease: Secondary | ICD-10-CM | POA: Diagnosis not present

## 2021-07-03 DIAGNOSIS — I10 Essential (primary) hypertension: Secondary | ICD-10-CM | POA: Diagnosis not present

## 2021-07-03 DIAGNOSIS — E782 Mixed hyperlipidemia: Secondary | ICD-10-CM | POA: Diagnosis not present

## 2021-07-03 DIAGNOSIS — R7301 Impaired fasting glucose: Secondary | ICD-10-CM | POA: Diagnosis not present

## 2021-07-03 DIAGNOSIS — D509 Iron deficiency anemia, unspecified: Secondary | ICD-10-CM | POA: Diagnosis not present

## 2021-07-08 DIAGNOSIS — D509 Iron deficiency anemia, unspecified: Secondary | ICD-10-CM | POA: Diagnosis not present

## 2021-07-08 DIAGNOSIS — N400 Enlarged prostate without lower urinary tract symptoms: Secondary | ICD-10-CM | POA: Diagnosis not present

## 2021-07-08 DIAGNOSIS — I10 Essential (primary) hypertension: Secondary | ICD-10-CM | POA: Diagnosis not present

## 2021-07-08 DIAGNOSIS — N1831 Chronic kidney disease, stage 3a: Secondary | ICD-10-CM | POA: Diagnosis not present

## 2021-07-08 DIAGNOSIS — I252 Old myocardial infarction: Secondary | ICD-10-CM | POA: Diagnosis not present

## 2021-07-08 DIAGNOSIS — E782 Mixed hyperlipidemia: Secondary | ICD-10-CM | POA: Diagnosis not present

## 2021-07-15 DIAGNOSIS — M0579 Rheumatoid arthritis with rheumatoid factor of multiple sites without organ or systems involvement: Secondary | ICD-10-CM | POA: Diagnosis not present

## 2021-07-15 DIAGNOSIS — M79643 Pain in unspecified hand: Secondary | ICD-10-CM | POA: Diagnosis not present

## 2021-07-15 DIAGNOSIS — M2550 Pain in unspecified joint: Secondary | ICD-10-CM | POA: Diagnosis not present

## 2021-07-15 DIAGNOSIS — N1831 Chronic kidney disease, stage 3a: Secondary | ICD-10-CM | POA: Diagnosis not present

## 2021-07-15 DIAGNOSIS — R700 Elevated erythrocyte sedimentation rate: Secondary | ICD-10-CM | POA: Diagnosis not present

## 2021-07-15 DIAGNOSIS — M7989 Other specified soft tissue disorders: Secondary | ICD-10-CM | POA: Diagnosis not present

## 2021-07-15 DIAGNOSIS — M1990 Unspecified osteoarthritis, unspecified site: Secondary | ICD-10-CM | POA: Diagnosis not present

## 2021-07-15 DIAGNOSIS — M810 Age-related osteoporosis without current pathological fracture: Secondary | ICD-10-CM | POA: Diagnosis not present

## 2021-07-15 DIAGNOSIS — Z79899 Other long term (current) drug therapy: Secondary | ICD-10-CM | POA: Diagnosis not present

## 2021-07-30 DIAGNOSIS — R600 Localized edema: Secondary | ICD-10-CM | POA: Insufficient documentation

## 2021-08-01 DIAGNOSIS — I129 Hypertensive chronic kidney disease with stage 1 through stage 4 chronic kidney disease, or unspecified chronic kidney disease: Secondary | ICD-10-CM | POA: Diagnosis not present

## 2021-08-01 DIAGNOSIS — N1831 Chronic kidney disease, stage 3a: Secondary | ICD-10-CM | POA: Diagnosis not present

## 2021-08-01 DIAGNOSIS — E782 Mixed hyperlipidemia: Secondary | ICD-10-CM | POA: Diagnosis not present

## 2021-09-18 DIAGNOSIS — M2550 Pain in unspecified joint: Secondary | ICD-10-CM | POA: Diagnosis not present

## 2021-10-02 DIAGNOSIS — I129 Hypertensive chronic kidney disease with stage 1 through stage 4 chronic kidney disease, or unspecified chronic kidney disease: Secondary | ICD-10-CM | POA: Diagnosis not present

## 2021-10-02 DIAGNOSIS — N1831 Chronic kidney disease, stage 3a: Secondary | ICD-10-CM | POA: Diagnosis not present

## 2021-10-02 DIAGNOSIS — M1990 Unspecified osteoarthritis, unspecified site: Secondary | ICD-10-CM | POA: Insufficient documentation

## 2021-10-02 DIAGNOSIS — E782 Mixed hyperlipidemia: Secondary | ICD-10-CM | POA: Diagnosis not present

## 2021-10-15 DIAGNOSIS — M2550 Pain in unspecified joint: Secondary | ICD-10-CM | POA: Diagnosis not present

## 2021-10-15 DIAGNOSIS — M7989 Other specified soft tissue disorders: Secondary | ICD-10-CM | POA: Diagnosis not present

## 2021-10-15 DIAGNOSIS — M1990 Unspecified osteoarthritis, unspecified site: Secondary | ICD-10-CM | POA: Diagnosis not present

## 2021-10-15 DIAGNOSIS — R700 Elevated erythrocyte sedimentation rate: Secondary | ICD-10-CM | POA: Diagnosis not present

## 2021-10-15 DIAGNOSIS — M810 Age-related osteoporosis without current pathological fracture: Secondary | ICD-10-CM | POA: Diagnosis not present

## 2021-10-15 DIAGNOSIS — M0579 Rheumatoid arthritis with rheumatoid factor of multiple sites without organ or systems involvement: Secondary | ICD-10-CM | POA: Diagnosis not present

## 2021-10-15 DIAGNOSIS — N1831 Chronic kidney disease, stage 3a: Secondary | ICD-10-CM | POA: Diagnosis not present

## 2021-10-15 DIAGNOSIS — M79643 Pain in unspecified hand: Secondary | ICD-10-CM | POA: Diagnosis not present

## 2021-10-15 DIAGNOSIS — Z79899 Other long term (current) drug therapy: Secondary | ICD-10-CM | POA: Diagnosis not present

## 2021-10-19 DIAGNOSIS — M2550 Pain in unspecified joint: Secondary | ICD-10-CM | POA: Diagnosis not present

## 2021-11-01 DIAGNOSIS — E782 Mixed hyperlipidemia: Secondary | ICD-10-CM | POA: Diagnosis not present

## 2021-11-01 DIAGNOSIS — N1831 Chronic kidney disease, stage 3a: Secondary | ICD-10-CM | POA: Diagnosis not present

## 2021-11-01 DIAGNOSIS — I129 Hypertensive chronic kidney disease with stage 1 through stage 4 chronic kidney disease, or unspecified chronic kidney disease: Secondary | ICD-10-CM | POA: Diagnosis not present

## 2021-11-25 DIAGNOSIS — G720 Drug-induced myopathy: Secondary | ICD-10-CM | POA: Insufficient documentation

## 2021-12-31 DIAGNOSIS — E782 Mixed hyperlipidemia: Secondary | ICD-10-CM | POA: Diagnosis not present

## 2021-12-31 DIAGNOSIS — R7301 Impaired fasting glucose: Secondary | ICD-10-CM | POA: Diagnosis not present

## 2021-12-31 DIAGNOSIS — Z125 Encounter for screening for malignant neoplasm of prostate: Secondary | ICD-10-CM | POA: Diagnosis not present

## 2021-12-31 DIAGNOSIS — I10 Essential (primary) hypertension: Secondary | ICD-10-CM | POA: Diagnosis not present

## 2022-01-02 ENCOUNTER — Ambulatory Visit: Payer: Medicare HMO | Attending: Internal Medicine | Admitting: Internal Medicine

## 2022-01-02 ENCOUNTER — Encounter: Payer: Self-pay | Admitting: Internal Medicine

## 2022-01-02 VITALS — BP 168/80 | HR 84 | Ht 75.0 in | Wt 204.0 lb

## 2022-01-02 DIAGNOSIS — E785 Hyperlipidemia, unspecified: Secondary | ICD-10-CM | POA: Insufficient documentation

## 2022-01-02 DIAGNOSIS — I77810 Thoracic aortic ectasia: Secondary | ICD-10-CM | POA: Diagnosis not present

## 2022-01-02 DIAGNOSIS — I209 Angina pectoris, unspecified: Secondary | ICD-10-CM | POA: Diagnosis not present

## 2022-01-02 DIAGNOSIS — E7849 Other hyperlipidemia: Secondary | ICD-10-CM

## 2022-01-02 DIAGNOSIS — I2510 Atherosclerotic heart disease of native coronary artery without angina pectoris: Secondary | ICD-10-CM

## 2022-01-02 MED ORDER — ASPIRIN 81 MG PO TBEC: 81.0000 mg | DELAYED_RELEASE_TABLET | Freq: Every day | ORAL | 3 refills | Status: DC

## 2022-01-02 NOTE — Patient Instructions (Signed)
Medication Instructions:  Your physician has recommended you make the following change in your medication:  -Start Aspirin 81 mg tablets daily -Stop Aspirin 325 mg tablets daily.   Labwork: CBC BMET   Testing/Procedures: Your physician has requested that you have an echocardiogram. Echocardiography is a painless test that uses sound waves to create images of your heart. It provides your doctor with information about the size and shape of your heart and how well your heart's chambers and valves are working. This procedure takes approximately one hour. There are no restrictions for this procedure. Please do NOT wear cologne, perfume, aftershave, or lotions (deodorant is allowed). Please arrive 15 minutes prior to your appointment time.  Chest CT  Follow-Up: Follow up with Dr. Dellia Cloud in 6 months.   Nurse Visit in 1 week- EKG   Any Other Special Instructions Will Be Listed Below (If Applicable).     If you need a refill on your cardiac medications before your next appointment, please call your pharmacy.

## 2022-01-02 NOTE — Progress Notes (Addendum)
Cardiology Office Note  Date: 01/02/2022   ID: Phillip Pace, DOB March 14, 1945, MRN [id number]  PCP:  Benita Stabile, MD  Cardiologist:  None Electrophysiologist:  None   Reason for Office Visit: Follow-up visit of CAD   History of Present Illness: Phillip Pace is a 76 y.o. male known to have CAD with multiple stents (13) and never had a at [facility] (Dr. Julien Nordmann) and unknown arteries (no records available), HTN, COPD, CHF, Barrett's esophagus presented to cardiology clinic for follow-up visit.  His last documented stress test was in 2016 which showed a small defect in the basal inferoseptal, basal inferior, mid inferoseptal and mid inferior location that was fixed and consistent with prior MI with no ischemia and EF 50%. 2D echo in 2016 with EF 40 to 45% with grade 1 diastolic dysfunction.  [name] here today for follow-up visit. Patient was admitted in 03/2021 with hyponatremia, polyarthralgia, AKI and was discharged in stable condition.  He said he was sent home with hospice care in 2/23 to die at home. He then stopped taking all his medications except allopurinol, pantoprazole and tamsulosin. He also takes aspirin 325 mg.  He said he felt a lot better after stopping metoprolol and lisinopril.  He changed his diet and lifestyle after 2/23 hospitalization following which he noticed significant improvement in his energy levels and also his blood pressure was normalized. At this point, I do not have records of his CAD/PCI from [place]. He denied any new symptoms and is overall doing great.   Past Medical History:  Diagnosis Date   Arthritis    BPH (benign prostatic hyperplasia)    CHF (congestive heart failure) (HCC)    Chronic combined systolic (congestive) and diastolic (congestive) heart failure (HCC)    last Echo with EF 45-50%, grade I diastolic dysfxn   COPD (chronic obstructive pulmonary disease) (HCC)    Coronary artery disease    hx of multiple MI's with 13  stents   Dilated aortic root (HCC)    42 mm by 2D echo 12/2020   Gout    Hypertension     Past Surgical History:  Procedure Laterality Date   COLONOSCOPY N/A 05/25/2017   Procedure: COLONOSCOPY;  Surgeon: Corbin Ade, MD;  [facility];  Service: Endoscopy;  Laterality: N/A;  12:00pm   ESOPHAGOGASTRODUODENOSCOPY N/A 05/25/2017   Procedure: ESOPHAGOGASTRODUODENOSCOPY (EGD);  Surgeon: Corbin Ade, MD;  [facility];  Service: Endoscopy;  Laterality: N/A;   FRACTURE SURGERY     collar bone   HERNIA REPAIR     x 2   MALONEY DILATION N/A 05/25/2017   Procedure: MALONEY DILATION;  Surgeon: Corbin Ade, MD;  [facility];  Service: Endoscopy;  Laterality: N/A;   stents 13      Current Outpatient Medications  Medication Sig Dispense Refill   allopurinol (ZYLOPRIM) 100 MG tablet Take 100 mg by mouth daily.     aspirin EC 81 MG tablet Take 1 tablet (81 mg total) by mouth daily. Swallow whole. 90 tablet 3   pantoprazole (PROTONIX) 40 MG tablet Take 1 tablet (40 mg total) by mouth 2 (two) times daily. TAKE 1 TABLET(40 MG) BY MOUTH TWICE DAILY BEFORE A MEAL Strength: 40 mg     tamsulosin (FLOMAX) 0.4 MG CAPS capsule Take 0.4 mg by mouth daily.      acetaminophen (TYLENOL) 325 MG tablet Take 2 tablets (650 mg total) by mouth every 6 (six)  hours as needed for mild pain (or Fever >/= 101). (Patient not taking: Reported on 01/02/2022)     amLODipine (NORVASC) 2.5 MG tablet Take 2.5 mg by mouth daily. (Patient not taking: Reported on 01/02/2022)     aspirin EC 81 MG tablet Take 1 tablet (81 mg total) by mouth daily. Swallow whole. (Patient not taking: Reported on 01/02/2022) 90 tablet 3   b complex vitamins capsule Take 1 capsule by mouth daily. (Patient not taking: Reported on 01/02/2022)     benzonatate (TESSALON) 200 MG capsule Take 200 mg by mouth 3 (three) times daily as needed for cough. (Patient not taking: Reported on 01/02/2022)     Cholecalciferol  (VITAMIN D3) 5000 units CAPS Take 5,000 Units by mouth daily. (Patient not taking: Reported on 01/02/2022)     diclofenac Sodium (VOLTAREN) 1 % GEL Apply 2 g topically 4 (four) times daily. (Patient not taking: Reported on 01/02/2022)     feeding supplement (ENSURE ENLIVE / ENSURE PLUS) LIQD Take 237 mLs by mouth 2 (two) times daily between meals. (Patient not taking: Reported on 01/02/2022)     HYDROcodone-acetaminophen (NORCO/VICODIN) 5-325 MG tablet      lisinopril (ZESTRIL) 5 MG tablet Take 1 tablet (5 mg total) by mouth every evening. (Patient not taking: Reported on 01/02/2022)     meloxicam (MOBIC) 7.5 MG tablet  (Patient not taking: Reported on 01/02/2022)     methocarbamol (ROBAXIN) 750 MG tablet Take 750 mg by mouth 4 (four) times daily. (Patient not taking: Reported on 01/02/2022)     metoprolol succinate (TOPROL-XL) 25 MG 24 hr tablet Take 1 tablet (25 mg total) by mouth daily. (Patient not taking: Reported on 01/02/2022) 90 tablet 3   NITROSTAT 0.4 MG SL tablet Place 0.4 mg under the tongue every 5 (five) minutes as needed. (Patient not taking: Reported on 01/02/2022)  5   Omega-3 Fatty Acids (FISH OIL) 1000 MG CAPS Take 2 capsules by mouth 2 (two) times daily. (Patient not taking: Reported on 01/02/2022)     ondansetron (ZOFRAN-ODT) 8 MG disintegrating tablet Take 8 mg by mouth 2 (two) times daily as needed for nausea or vomiting. (Patient not taking: Reported on 01/02/2022)     predniSONE (DELTASONE) 10 MG tablet Take 5 tablets (50 mg total) by mouth daily with breakfast. X 5 days, the 4 tablets (40 mg) daily on 03/24/21 and decrease by 1 tablet every 7 days (Patient not taking: Reported on 01/02/2022) 95 tablet 0   rosuvastatin (CRESTOR) 40 MG tablet Take 1 tablet (40 mg total) by mouth daily. (Patient not taking: Reported on 01/02/2022) 90 tablet 3   traMADol (ULTRAM) 50 MG tablet Take 50 mg by mouth 2 (two) times daily. (Patient not taking: Reported on 01/02/2022)     No current  facility-administered medications for this visit.   Allergies:  Penicillins and Shellfish allergy   Social History: The patient  reports that he quit smoking about 28 years ago. His smoking use included cigarettes. He has never used smokeless tobacco. He reports that he does not drink alcohol and does not use drugs.   Family History: The patient's family history includes CAD in his mother; Cancer - Colon in his mother; Colon cancer in his father.   ROS:  Please see the history of present illness. Otherwise, complete review of systems is positive for none.  All other systems are reviewed and negative.   Physical Exam: VS:  BP (!) 168/80   Pulse 84   Ht 6'  3" (1.905 m)   Wt 204 lb (92.5 kg)   BMI 25.50 kg/m , BMI Body mass index is 25.5 kg/m.  Wt Readings from Last 3 Encounters:  01/02/22 204 lb (92.5 kg)  06/01/21 163 lb 12.8 oz (74.3 kg)  03/17/21 163 lb 12.8 oz (74.3 kg)    General: Patient appears comfortable at rest. HEENT: Conjunctiva and lids normal, oropharynx clear with moist mucosa. Neck: Supple, no elevated JVP or carotid bruits, no thyromegaly. Lungs: Clear to auscultation, nonlabored breathing at rest. Cardiac: Regular rate and rhythm, no S3 or significant systolic murmur, no pericardial rub. Abdomen: Soft, nontender, no hepatomegaly, bowel sounds present, no guarding or rebound. Extremities: No pitting edema, distal pulses 2+. Skin: Warm and dry. Musculoskeletal: No kyphosis. Neuropsychiatric: Alert and oriented x3, affect grossly appropriate.  ECG:  An ECG dated 01/02/2022 was personally reviewed today and demonstrated:  Uninterpretable rhythm  Recent Labwork: 03/16/2021: ALT 24; AST 29; B Natriuretic Peptide 42.0 03/17/2021: TSH 0.871 03/18/2021: BUN 34; Creatinine, Ser 1.19; Hemoglobin 7.6; Magnesium 1.8; Platelets 348; Potassium 3.8; Sodium 138     Component Value Date/Time   CHOL 161 11/12/2020 1103   TRIG 347 (H) 11/12/2020 1103   HDL 41 11/12/2020 1103    CHOLHDL 3.9 11/12/2020 1103   VLDL 69 (H) 11/12/2020 1103   LDLCALC 51 11/12/2020 1103    Other Studies Reviewed Today: Echo from 12/2020 LVEF 45 to 50% Grade 1 diastolic dysfunction LV systolic function is normal Small pericardial effusion Aortic root dilatation 42 mm  Assessment and Plan: [name] a 76 year old M known to have CAD status post multiple PCI in [place] (last stent 10 years ago) with LVEF 45 to 50%, aortic root dilatation 42 mm, HTN, COPD, Barrett's esophagus presented to cardiology clinic for follow-up visit.  # CAD status post multiple PCI in [place] with LVEF 45 to 50%, currently angina free -Switch aspirin 325 mg to aspirin 81 mg once daily -Patient had myalgias with statins in the past. Refused to try every other day statin regimen. Refused injectables. -Obtain 2D Echo  # Aortic root dilatation 42 mm -Obtain CTA chest thoracic aorta for accurate delineation of the aortic root measurements.  # HTN -Patient stopped taking all his medications and changed his diet/lifestyle following which his blood pressures normalized.  He brought log of home blood pressure readings which are in the range of 110 to 120 mmHg SBP.  Goal the pressure should be less than 150 mmHg SBP.  I have spent a total of 35 minutes with patient reviewing chart , telemetry, EKGs, labs and examining patient as well as establishing an assessment and plan that was discussed with the patient.  > 50% of time was spent in direct patient care.     Medication Adjustments/Labs and Tests Ordered: Current medicines are reviewed at length with the patient today.  Concerns regarding medicines are outlined above.   Tests Ordered: Orders Placed This Encounter  Procedures   CT ANGIO CHEST AORTA W &/OR WO CONTRAST   CBC   Basic metabolic panel   EKG 12-Lead   ECHOCARDIOGRAM COMPLETE    Medication Changes: Meds ordered this encounter  Medications   aspirin EC 81 MG tablet    Sig: Take 1 tablet (81 mg  total) by mouth daily. Swallow whole.    Dispense:  90 tablet    Refill:  3    Disposition:  Follow up  6 months.  Nurse visit next week for EKG (should be accompanied by someone  to hold his service dog)  Signed, Kayston Jodoin Verne Spurr, MD, 01/02/2022 1:03 PM    [facility] [address] [place], [place] [address]

## 2022-01-06 DIAGNOSIS — R7303 Prediabetes: Secondary | ICD-10-CM | POA: Insufficient documentation

## 2022-01-07 DIAGNOSIS — I712 Thoracic aortic aneurysm, without rupture, unspecified: Secondary | ICD-10-CM | POA: Insufficient documentation

## 2022-01-07 DIAGNOSIS — D509 Iron deficiency anemia, unspecified: Secondary | ICD-10-CM | POA: Diagnosis not present

## 2022-01-07 DIAGNOSIS — I252 Old myocardial infarction: Secondary | ICD-10-CM | POA: Diagnosis not present

## 2022-01-07 DIAGNOSIS — N1831 Chronic kidney disease, stage 3a: Secondary | ICD-10-CM | POA: Diagnosis not present

## 2022-01-07 DIAGNOSIS — R7303 Prediabetes: Secondary | ICD-10-CM | POA: Diagnosis not present

## 2022-01-07 DIAGNOSIS — Z0000 Encounter for general adult medical examination without abnormal findings: Secondary | ICD-10-CM | POA: Diagnosis not present

## 2022-01-07 DIAGNOSIS — E782 Mixed hyperlipidemia: Secondary | ICD-10-CM | POA: Diagnosis not present

## 2022-01-07 DIAGNOSIS — I10 Essential (primary) hypertension: Secondary | ICD-10-CM | POA: Diagnosis not present

## 2022-01-07 DIAGNOSIS — N400 Enlarged prostate without lower urinary tract symptoms: Secondary | ICD-10-CM | POA: Diagnosis not present

## 2022-01-08 ENCOUNTER — Other Ambulatory Visit (HOSPITAL_COMMUNITY)
Admission: RE | Admit: 2022-01-08 | Discharge: 2022-01-08 | Disposition: A | Discharge status: Home / Self Care | Payer: Medicare HMO | Source: Ambulatory Visit | Attending: Internal Medicine | Admitting: Internal Medicine

## 2022-01-08 DIAGNOSIS — I77810 Thoracic aortic ectasia: Secondary | ICD-10-CM | POA: Insufficient documentation

## 2022-01-08 LAB — CBC
HCT: 36.1 % — ABNORMAL LOW (ref 39.0–52.0)
Hemoglobin: 12 g/dL — ABNORMAL LOW (ref 13.0–17.0)
MCH: 26.7 pg (ref 26.0–34.0)
MCHC: 33.2 g/dL (ref 30.0–36.0)
MCV: 80.4 fL (ref 80.0–100.0)
Platelets: 188 10*3/uL (ref 150–400)
RBC: 4.49 MIL/uL (ref 4.22–5.81)
RDW: 14.6 % (ref 11.5–15.5)
WBC: 4.9 10*3/uL (ref 4.0–10.5)
nRBC: 0 % (ref 0.0–0.2)

## 2022-01-08 LAB — BASIC METABOLIC PANEL
Anion gap: 9 (ref 5–15)
BUN: 15 mg/dL (ref 8–23)
CO2: 21 mmol/L — ABNORMAL LOW (ref 22–32)
Calcium: 9.1 mg/dL (ref 8.9–10.3)
Chloride: 106 mmol/L (ref 98–111)
Creatinine, Ser: 1.27 mg/dL — ABNORMAL HIGH (ref 0.61–1.24)
GFR, Estimated: 59 mL/min — ABNORMAL LOW (ref >60)
Glucose, Bld: 97 mg/dL (ref 70–99)
Potassium: 3.5 mmol/L (ref 3.5–5.1)
Sodium: 136 mmol/L (ref 135–145)

## 2022-01-09 ENCOUNTER — Ambulatory Visit: Payer: Medicare HMO

## 2022-01-09 ENCOUNTER — Other Ambulatory Visit (INDEPENDENT_AMBULATORY_CARE_PROVIDER_SITE_OTHER): Payer: Medicare HMO

## 2022-01-09 DIAGNOSIS — I2510 Atherosclerotic heart disease of native coronary artery without angina pectoris: Secondary | ICD-10-CM

## 2022-01-12 ENCOUNTER — Telehealth: Payer: Self-pay

## 2022-01-12 ENCOUNTER — Other Ambulatory Visit (HOSPITAL_COMMUNITY): Payer: Medicare HMO

## 2022-01-12 NOTE — Telephone Encounter (Signed)
-----   Message from Chalmers Guest, MD sent at 01/12/2022  9:05 AM EST ----- Serum creatinine significantly improved from prior values. Continue current plan.

## 2022-01-12 NOTE — Telephone Encounter (Signed)
Patient notified and verbalized understanding. PCP copied.   

## 2022-02-03 ENCOUNTER — Ambulatory Visit (HOSPITAL_COMMUNITY): Payer: Medicare HMO

## 2022-02-19 DIAGNOSIS — Z79899 Other long term (current) drug therapy: Secondary | ICD-10-CM | POA: Diagnosis not present

## 2022-02-19 DIAGNOSIS — M1990 Unspecified osteoarthritis, unspecified site: Secondary | ICD-10-CM | POA: Diagnosis not present

## 2022-02-19 DIAGNOSIS — I509 Heart failure, unspecified: Secondary | ICD-10-CM | POA: Diagnosis not present

## 2022-02-19 DIAGNOSIS — M0579 Rheumatoid arthritis with rheumatoid factor of multiple sites without organ or systems involvement: Secondary | ICD-10-CM | POA: Diagnosis not present

## 2022-02-19 DIAGNOSIS — M810 Age-related osteoporosis without current pathological fracture: Secondary | ICD-10-CM | POA: Diagnosis not present

## 2022-02-19 DIAGNOSIS — M25569 Pain in unspecified knee: Secondary | ICD-10-CM | POA: Diagnosis not present

## 2022-02-19 DIAGNOSIS — N1831 Chronic kidney disease, stage 3a: Secondary | ICD-10-CM | POA: Diagnosis not present

## 2022-02-19 DIAGNOSIS — R700 Elevated erythrocyte sedimentation rate: Secondary | ICD-10-CM | POA: Diagnosis not present

## 2022-04-14 ENCOUNTER — Encounter: Payer: Self-pay | Admitting: *Deleted

## 2022-06-10 DIAGNOSIS — M25561 Pain in right knee: Secondary | ICD-10-CM | POA: Diagnosis not present

## 2022-06-10 DIAGNOSIS — M25562 Pain in left knee: Secondary | ICD-10-CM | POA: Diagnosis not present

## 2022-06-22 ENCOUNTER — Encounter: Payer: Self-pay | Admitting: Orthopedic Surgery

## 2022-06-22 ENCOUNTER — Other Ambulatory Visit (INDEPENDENT_AMBULATORY_CARE_PROVIDER_SITE_OTHER): Payer: Medicare HMO

## 2022-06-22 ENCOUNTER — Ambulatory Visit: Payer: Medicare HMO | Admitting: Orthopedic Surgery

## 2022-06-22 VITALS — BP 151/82 | HR 97 | Ht 75.0 in | Wt 200.0 lb

## 2022-06-22 DIAGNOSIS — M25562 Pain in left knee: Secondary | ICD-10-CM | POA: Diagnosis not present

## 2022-06-22 DIAGNOSIS — J449 Chronic obstructive pulmonary disease, unspecified: Secondary | ICD-10-CM | POA: Diagnosis not present

## 2022-06-22 DIAGNOSIS — M25561 Pain in right knee: Secondary | ICD-10-CM

## 2022-06-22 DIAGNOSIS — G8929 Other chronic pain: Secondary | ICD-10-CM

## 2022-06-22 DIAGNOSIS — M659 Synovitis and tenosynovitis, unspecified: Secondary | ICD-10-CM

## 2022-06-22 MED ORDER — PREDNISONE 10 MG PO TABS: 10.0000 mg | ORAL_TABLET | Freq: Every day | ORAL | 0 refills | Status: DC

## 2022-06-22 NOTE — Progress Notes (Signed)
Chief Complaint  Patient presents with   Ankle Pain    Bilateral/ trouble walking and hurting   Knee Pain    Bilateral/ hurting   History 77 year old male comes in and says I cannot walk.  I have asked him why he cannot walk he says because it hurts  He denies any trauma  He has a history of gout  He is currently on no medication for arthritis but takes medication for gout  He has bilateral knee pain which started on around the time he was treated for hypertension with the inappropriate medication at least according to him  He stopped the medications he got better in terms of his hypertension related symptoms.  He says he was advised to seek hospice care but once the medications were stopped he felt fine  Review of systems night sweating pain in legs after walking otherwise he list everything else is normal on review of systems  [facility] Past Surgical History:  Procedure Laterality Date   COLONOSCOPY N/A 05/25/2017   Procedure: COLONOSCOPY;  Surgeon: Corbin Ade, MD;  [facility];  Service: Endoscopy;  Laterality: N/A;  12:00pm   ESOPHAGOGASTRODUODENOSCOPY N/A 05/25/2017   Procedure: ESOPHAGOGASTRODUODENOSCOPY (EGD);  Surgeon: Corbin Ade, MD;  [facility];  Service: Endoscopy;  Laterality: N/A;   FRACTURE SURGERY     collar bone   HERNIA REPAIR     x 2   MALONEY DILATION N/A 05/25/2017   Procedure: MALONEY DILATION;  Surgeon: Corbin Ade, MD;  [facility];  Service: Endoscopy;  Laterality: N/A;   stents 13     Current Outpatient Medications  Medication Instructions   acetaminophen (TYLENOL) 650 mg, Oral, Every 6 hours PRN   allopurinol (ZYLOPRIM) 100 mg, Oral, Daily   amLODipine (NORVASC) 2.5 mg, Daily   b complex vitamins capsule 1 capsule, Oral, Daily   benzonatate (TESSALON) 200 mg, Oral, 3 times daily PRN   HYDROcodone-acetaminophen (NORCO/VICODIN) 5-325 MG tablet    metoprolol succinate (TOPROL-XL) 25 mg, Oral, Daily    Nitrostat 0.4 mg, Every 5 min PRN   Omega-3 Fatty Acids (FISH OIL) 1000 MG CAPS 2 capsules, Oral, 2 times daily   ondansetron (ZOFRAN-ODT) 8 mg, 2 times daily PRN   pantoprazole (PROTONIX) 40 mg, Oral, 2 times daily, TAKE 1 TABLET(40 MG) BY MOUTH TWICE DAILY BEFORE A MEAL<BR>Strength: 40 mg   predniSONE (DELTASONE) 10 mg, Oral, Daily   rosuvastatin (CRESTOR) 40 mg, Oral, Daily   tamsulosin (FLOMAX) 0.4 mg, Oral, Daily   traMADol (ULTRAM) 50 mg, Oral, 2 times daily   Vitamin D3 5,000 Units, Oral, Daily   Family History  Problem Relation Age of Onset   Cancer - Colon Mother    CAD Mother    Colon cancer Father    Gastric cancer Neg Hx    Esophageal cancer Neg Hx     BP (!) 151/82   Pulse 97   Ht 6\' 3"  (1.905 m)   Wt 200 lb (90.7 kg)   BMI 25.00 kg/m  Physical Exam Vitals and nursing note reviewed.  Constitutional:      Appearance: Normal appearance.  HENT:     Head: Normocephalic and atraumatic.  Eyes:     General: No scleral icterus.       Right eye: No discharge.        Left eye: No discharge.     Extraocular Movements: Extraocular movements intact.     Conjunctiva/sclera: Conjunctivae normal.  Pupils: Pupils are equal, round, and reactive to light.  Cardiovascular:     Rate and Rhythm: Normal rate.     Pulses: Normal pulses.  Musculoskeletal:     Comments: Knee exams he does not quite get the knee all the way to the table on each side indicating a slight flexion contracture his knees look swollen and the synovium looks boggy.  He can flex the knee on his own and he flexes up to 125 degrees with no instability detected  The knees are not warm to touch there is no erythema around the skin the muscle tone is normal    Skin:    General: Skin is warm and dry.     Capillary Refill: Capillary refill takes less than 2 seconds.  Neurological:     General: No focal deficit present.     Mental Status: He is alert and oriented to person, place, and time.     Gait: Gait  abnormal.  Psychiatric:        Mood and Affect: Mood normal.        Behavior: Behavior normal.        Thought Content: Thought content normal.        Judgment: Judgment normal.    .  Encounter Diagnoses  Name Primary?   Chronic pain of left knee    Chronic pain of right knee    Synovitis of knee Yes   Assessment and plan  X-rays were done the x-rays show very mild arthritis I gave him a grade of 2  So there is some concern that his knee pain may be extra-articular in nature or may be related to spinal disease and contributing to his "inability to walk"  However, he did have the synovitis  77 year old male with unexplained synovitis of his right and left knee.  He says he has gout and takes medication for that he is never had aspiration injection of his knees and he says he was not on any arthritis medication but has a listed medication of meloxicam though no longer taking  I ref recommended we aspirate and inject his knees we got about 20 cc on the right we got 10 on the left  I am going to have him come back in 2 weeks and probably repeat this he seems to have some type of synovitis may be from gout  Meds ordered this encounter  Medications   predniSONE (DELTASONE) 10 MG tablet    Sig: Take 1 tablet (10 mg total) by mouth daily.    Dispense:  60 tablet    Refill:  0     Procedure note injection and aspiration left knee joint  Verbal consent was obtained to aspirate and inject the left knee joint   Timeout was completed to confirm the site of aspiration and injection  An 18-gauge needle was used to aspirate the left knee joint from a suprapatellar lateral approach.  The medications used were 40 mg of Depo-Medrol and 1% lidocaine 3 cc  Anesthesia was provided by ethyl chloride and the skin was prepped with alcohol.  After cleaning the skin with alcohol an 18-gauge needle was used to aspirate the right knee joint.  We obtained 5 cc of fluid CLR YELL  We followed  this by injection of 40 mg of Depo-Medrol and 3 cc 1% lidocaine.  There were no complications. A sterile bandage was applied.   Procedure note injection and aspiration right knee joint  Verbal consent was obtained  to aspirate and inject the right knee joint   Timeout was completed to confirm the site of aspiration and injection  An 18-gauge needle was used to aspirate the knee joint from a suprapatellar lateral approach.  The medications used were 40 mg of Depo-Medrol and 1% lidocaine 3 cc  Anesthesia was provided by ethyl chloride and the skin was prepped with alcohol.  After cleaning the skin with alcohol an 18-gauge needle was used to aspirate the right knee joint.  We obtained 20  cc of fluid CLR YEL  We follow this by injection of 40 mg of Depo-Medrol and 3 cc 1% lidocaine.  There were no complications. A sterile bandage was applied.

## 2022-07-09 ENCOUNTER — Ambulatory Visit: Payer: Medicare HMO | Admitting: Internal Medicine

## 2022-07-09 ENCOUNTER — Encounter: Payer: Self-pay | Admitting: Orthopedic Surgery

## 2022-07-09 ENCOUNTER — Ambulatory Visit: Payer: Medicare HMO | Admitting: Orthopedic Surgery

## 2022-07-09 VITALS — BP 133/77 | HR 106

## 2022-07-09 DIAGNOSIS — M25561 Pain in right knee: Secondary | ICD-10-CM | POA: Diagnosis not present

## 2022-07-09 DIAGNOSIS — M25562 Pain in left knee: Secondary | ICD-10-CM

## 2022-07-09 DIAGNOSIS — G8929 Other chronic pain: Secondary | ICD-10-CM | POA: Diagnosis not present

## 2022-07-09 DIAGNOSIS — M659 Synovitis and tenosynovitis, unspecified: Secondary | ICD-10-CM

## 2022-07-09 MED ORDER — METHYLPREDNISOLONE ACETATE 40 MG/ML IJ SUSP
40.0000 mg | Freq: Once | INTRAMUSCULAR | Status: AC
Start: 2022-07-09 — End: 2022-07-09
  Administered 2022-07-09: 40 mg via INTRA_ARTICULAR

## 2022-07-09 NOTE — Progress Notes (Signed)
Chief Complaint  Patient presents with   Knee Pain    Fluid removed bil knee   77 year old male with synovitis of both knees did well with injections for several and the pain and swelling came.  He says he was able to do a lot more   right aspirated today 10 CC yellow fluid l  Left knee still has synovitis but not as much fluid only 2 cc  Both knees INJECTED   Procedure note injection and aspiration left knee joint  Verbal consent was obtained to aspirate and inject the left knee joint   Timeout was completed to confirm the site of aspiration and injection  An 18-gauge needle was used to aspirate the left knee joint from a suprapatellar lateral approach.  The medications used were 40 mg of Depo-Medrol and 1% lidocaine 3 cc  Anesthesia was provided by ethyl chloride and the skin was prepped with alcohol.  After cleaning the skin with alcohol an 18-gauge needle was used to aspirate the right knee joint.  We obtained 10 cc of fluid CLR YEL   We followed this by injection of 40 mg of Depo-Medrol and 3 cc 1% lidocaine.  There were no complications. A sterile bandage was applied.   Procedure note injection and aspiration right knee joint  Verbal consent was obtained to aspirate and inject the right knee joint   Timeout was completed to confirm the site of aspiration and injection  An 18-gauge needle was used to aspirate the knee joint from a suprapatellar lateral approach.  The medications used were 40 mg of Depo-Medrol and 1% lidocaine 3 cc  Anesthesia was provided by ethyl chloride and the skin was prepped with alcohol.  After cleaning the skin with alcohol an 18-gauge needle was used to aspirate the right knee joint.  We obtained 2   cc of fluid CLR YEL   We follow this by injection of 40 mg of Depo-Medrol and 3 cc 1% lidocaine.  There were no complications. A sterile bandage was applied.    FU 4 WEEKS   CK LSF AND XR BL ANKLES

## 2022-07-10 DIAGNOSIS — D509 Iron deficiency anemia, unspecified: Secondary | ICD-10-CM | POA: Diagnosis not present

## 2022-07-10 DIAGNOSIS — I10 Essential (primary) hypertension: Secondary | ICD-10-CM | POA: Diagnosis not present

## 2022-07-10 DIAGNOSIS — R7301 Impaired fasting glucose: Secondary | ICD-10-CM | POA: Diagnosis not present

## 2022-07-10 DIAGNOSIS — N400 Enlarged prostate without lower urinary tract symptoms: Secondary | ICD-10-CM | POA: Diagnosis not present

## 2022-07-10 DIAGNOSIS — E782 Mixed hyperlipidemia: Secondary | ICD-10-CM | POA: Diagnosis not present

## 2022-07-15 ENCOUNTER — Other Ambulatory Visit: Payer: Self-pay

## 2022-07-15 ENCOUNTER — Encounter: Payer: Self-pay | Admitting: Internal Medicine

## 2022-07-15 ENCOUNTER — Ambulatory Visit: Payer: Medicare HMO | Attending: Internal Medicine | Admitting: Internal Medicine

## 2022-07-15 ENCOUNTER — Ambulatory Visit: Payer: Medicare HMO | Admitting: Internal Medicine

## 2022-07-15 VITALS — BP 176/98 | HR 86 | Ht 75.0 in | Wt 200.0 lb

## 2022-07-15 DIAGNOSIS — J449 Chronic obstructive pulmonary disease, unspecified: Secondary | ICD-10-CM | POA: Diagnosis not present

## 2022-07-15 DIAGNOSIS — I2510 Atherosclerotic heart disease of native coronary artery without angina pectoris: Secondary | ICD-10-CM

## 2022-07-15 DIAGNOSIS — I10 Essential (primary) hypertension: Secondary | ICD-10-CM

## 2022-07-15 DIAGNOSIS — I77810 Thoracic aortic ectasia: Secondary | ICD-10-CM

## 2022-07-15 DIAGNOSIS — E7801 Familial hypercholesterolemia: Secondary | ICD-10-CM | POA: Insufficient documentation

## 2022-07-15 DIAGNOSIS — R748 Abnormal levels of other serum enzymes: Secondary | ICD-10-CM | POA: Insufficient documentation

## 2022-07-15 DIAGNOSIS — Z0000 Encounter for general adult medical examination without abnormal findings: Secondary | ICD-10-CM | POA: Diagnosis not present

## 2022-07-15 DIAGNOSIS — R809 Proteinuria, unspecified: Secondary | ICD-10-CM | POA: Insufficient documentation

## 2022-07-15 NOTE — Patient Instructions (Signed)
Medication Instructions:  Your physician recommends that you continue on your current medications as directed. Please refer to the Current Medication list given to you today.   Labwork: None  Testing/Procedures: None  Follow-Up: Your physician recommends that you schedule a follow-up appointment in: 1 year. You will receive a reminder call in the mail in about 10 months reminding you to call and schedule your appointment. If you don't receive this call, please contact our office.   Any Other Special Instructions Will Be Listed Below (If Applicable).  If you need a refill on your cardiac medications before your next appointment, please call your pharmacy.  

## 2022-07-15 NOTE — Progress Notes (Signed)
Cardiology Office Note  Date: 07/15/2022   ID: Phillip Pace, DOB [birth year], MRN [id number]  PCP:  Benita Stabile, MD  Cardiologist:  None Electrophysiologist:  None   Reason for Office Visit: Follow-up visit of CAD   History of Present Illness: Phillip Pace is a 77 y.o. male known to have CAD with multiple stents (13) at [facility] (Dr. 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  His last documented stress test was in 2016 which showed a small defect in the basal inferoseptal, basal inferior, mid inferoseptal and mid inferior location that was fixed and consistent with prior MI with no ischemia and EF 50%.  Echocardiogram in 2022 showed LVEF 45 to 50%, RWMA, normal RV systolic function, aortic root 42 mm and no valvular disease.  [name] here today for follow-up visit. Patient was admitted in 03/2021 with hyponatremia, polyarthralgia, AKI and was discharged in stable condition.  He said he was sent home with hospice care in 2/23 to die at home. He then stopped taking all his blood pressure medications except allopurinol, pantoprazole and tamsulosin. He also takes aspirin 325 mg.  He said he felt a lot better after stopping metoprolol and lisinopril.  He changed his diet and lifestyle after 2/23 hospitalization following which he noticed significant improvement in his energy levels and also his blood pressure was normalized.   He is overall doing great, no symptoms of angina or DOE.  No dizziness, syncope, palpitations or leg swelling.   Past Medical History:  Diagnosis Date   Arthritis    BPH (benign prostatic hyperplasia)    CHF (congestive heart failure) (HCC)    Chronic combined systolic (congestive) and diastolic (congestive) heart failure (HCC)    last Echo with EF 45-50%, grade I diastolic dysfxn   COPD (chronic obstructive pulmonary disease) (HCC)     Coronary artery disease    hx of multiple MI's with 13 stents   Dilated aortic root (HCC)    42 mm by 2D echo 12/2020   Gout    Hypertension     Past Surgical History:  Procedure Laterality Date   COLONOSCOPY N/A 05/25/2017   Procedure: COLONOSCOPY;  Surgeon: Corbin Ade, MD;  [facility];  Service: Endoscopy;  Laterality: N/A;  12:00pm   ESOPHAGOGASTRODUODENOSCOPY N/A 05/25/2017   Procedure: ESOPHAGOGASTRODUODENOSCOPY (EGD);  Surgeon: Corbin Ade, MD;  [facility];  Service: Endoscopy;  Laterality: N/A;   FRACTURE SURGERY     collar bone   HERNIA REPAIR     x 2   MALONEY DILATION N/A 05/25/2017   Procedure: MALONEY DILATION;  Surgeon: Corbin Ade, MD;  Location: [facility];  Service: Endoscopy;  Laterality: N/A;   stents 13      Current Outpatient Medications  Medication Sig Dispense Refill   acetaminophen (TYLENOL) 325 MG tablet Take 2 tablets (650 mg total) by mouth every 6 (six) hours as needed for mild pain (or Fever >/= 101).     allopurinol (ZYLOPRIM) 100 MG tablet Take 100 mg by mouth daily.     ascorbic acid (VITAMIN C) 100 MG tablet Take by mouth.     aspirin EC 325 MG tablet Take by mouth.     b complex vitamins capsule Take 1 capsule by mouth daily.     Cholecalciferol (VITAMIN D3) 5000 units CAPS Take 5,000 Units by mouth daily.  HYDROcodone-acetaminophen (NORCO/VICODIN) 5-325 MG tablet      NITROSTAT 0.4 MG SL tablet Place 0.4 mg under the tongue every 5 (five) minutes as needed.  5   Omega-3 Fatty Acids (FISH OIL) 1000 MG CAPS Take 2 capsules by mouth 2 (two) times daily.     pantoprazole (PROTONIX) 40 MG tablet Take 1 tablet (40 mg total) by mouth 2 (two) times daily. TAKE 1 TABLET(40 MG) BY MOUTH TWICE DAILY BEFORE A MEAL Strength: 40 mg     tamsulosin (FLOMAX) 0.4 MG CAPS capsule Take 0.4 mg by mouth daily.      traMADol (ULTRAM) 50 MG tablet Take 50 mg by mouth 2 (two) times daily.     amLODipine (NORVASC) 2.5 MG  tablet Take 2.5 mg by mouth daily. (Patient not taking: Reported on 07/09/2022)     benzonatate (TESSALON) 200 MG capsule Take 200 mg by mouth 3 (three) times daily as needed for cough. (Patient not taking: Reported on 07/09/2022)     metoprolol succinate (TOPROL-XL) 25 MG 24 hr tablet Take 1 tablet (25 mg total) by mouth daily. (Patient not taking: Reported on 07/15/2022) 90 tablet 3   ondansetron (ZOFRAN-ODT) 8 MG disintegrating tablet Take 8 mg by mouth 2 (two) times daily as needed for nausea or vomiting. (Patient not taking: Reported on 07/15/2022)     predniSONE (DELTASONE) 10 MG tablet Take 1 tablet (10 mg total) by mouth daily. (Patient not taking: Reported on 07/09/2022) 60 tablet 0   rosuvastatin (CRESTOR) 40 MG tablet Take 1 tablet (40 mg total) by mouth daily. (Patient not taking: Reported on 01/02/2022) 90 tablet 3   No current facility-administered medications for this visit.   Allergies:  Metoprolol, Penicillins, and Shellfish allergy   Social History: The patient  reports that he quit smoking about 29 years ago. His smoking use included cigarettes. He has never used smokeless tobacco. He reports that he does not drink alcohol and does not use drugs.   Family History: The patient's family history includes CAD in his mother; Cancer - Colon in his mother; Colon cancer in his father.   ROS:  Please see the history of present illness. Otherwise, complete review of systems is positive for none.  All other systems are reviewed and negative.   Physical Exam: VS:  BP (!) 176/98   Pulse 86   Ht 6\' 3"  (1.905 m)   Wt 200 lb (90.7 kg)   SpO2 98%   BMI 25.00 kg/m , BMI Body mass index is 25 kg/m.  Wt Readings from Last 3 Encounters:  07/15/22 200 lb (90.7 kg)  06/22/22 200 lb (90.7 kg)  01/02/22 204 lb (92.5 kg)    General: Patient appears comfortable at rest. HEENT: Conjunctiva and lids normal, oropharynx clear with moist mucosa. Neck: Supple, no elevated JVP or carotid bruits, no  thyromegaly. Lungs: Clear to auscultation, nonlabored breathing at rest. Cardiac: Regular rate and rhythm, no S3 or significant systolic murmur, no pericardial rub. Abdomen: Soft, nontender, no hepatomegaly, bowel sounds present, no guarding or rebound. Extremities: No pitting edema, distal pulses 2+. Skin: Warm and dry. Musculoskeletal: No kyphosis. Neuropsychiatric: Alert and oriented x3, affect grossly appropriate.  ECG:  An ECG dated 01/02/2022 was personally reviewed today and demonstrated:  Uninterpretable rhythm  Recent Labwork: 01/08/2022: BUN 15; Creatinine, Ser 1.27; Hemoglobin 12.0; Platelets 188; Potassium 3.5; Sodium 136     Component Value Date/Time   CHOL 161 11/12/2020 1103   TRIG 347 (H) 11/12/2020 1103   HDL  41 11/12/2020 1103   CHOLHDL 3.9 11/12/2020 1103   VLDL 69 (H) 11/12/2020 1103   LDLCALC 51 11/12/2020 1103    Other Studies Reviewed Today: Echo from 12/2020 LVEF 45 to 50% Grade 1 diastolic dysfunction LV systolic function is normal Small pericardial effusion Aortic root dilatation 42 mm  Assessment and Plan: [name] a 77 year old M known to have CAD status post multiple PCI in [place] (last stent 10 years ago) with LVEF 45 to 50%, aortic root dilatation 42 mm, HTN, COPD, Barrett's esophagus presented to cardiology clinic for follow-up visit.  # CAD status post multiple PCI in [place] with LVEF 45 to 50%, currently angina free -Continue aspirin 81 mg once daily -Patient had myalgias with statins in the past. Refused to try every other day statin regimen. Refused injectables. -Patient had myalgias with statins in the past.  Refused to try every other day statin regimen.  Refused injectables. -Obtain 2D echocardiogram (which the patient will get after he pays his insurance bills).  # Aortic root dilatation 42 mm -Obtain CTA chest thoracic aorta for accurate delineation of the aortic root measurements.  Patient will get this test done after his insurance  bills are paid.  # HTN -Patient stopped taking all his medications and changed his diet/lifestyle following which his blood pressures normalized.  He brought log of home blood pressure readings which are in the range of 110 to 120 mmHg SBP.  Goal the pressure should be less than 150 mmHg SBP.  I have spent a total of 30 minutes with patient reviewing chart , telemetry, EKGs, labs and examining patient as well as establishing an assessment and plan that was discussed with the patient.  > 50% of time was spent in direct patient care.     Medication Adjustments/Labs and Tests Ordered: Current medicines are reviewed at length with the patient today.  Concerns regarding medicines are outlined above.   Tests Ordered: No orders of the defined types were placed in this encounter.   Medication Changes: No orders of the defined types were placed in this encounter.   Disposition:  Follow up  one year  Signed, Caelan Branden Verne Spurr, MD, 07/15/2022 10:00 AM    [facility] [address] [place], [place] [address]

## 2022-07-16 DIAGNOSIS — N1831 Chronic kidney disease, stage 3a: Secondary | ICD-10-CM | POA: Diagnosis not present

## 2022-07-16 DIAGNOSIS — N400 Enlarged prostate without lower urinary tract symptoms: Secondary | ICD-10-CM | POA: Diagnosis not present

## 2022-07-16 DIAGNOSIS — D509 Iron deficiency anemia, unspecified: Secondary | ICD-10-CM | POA: Diagnosis not present

## 2022-07-16 DIAGNOSIS — Z0000 Encounter for general adult medical examination without abnormal findings: Secondary | ICD-10-CM | POA: Diagnosis not present

## 2022-07-16 DIAGNOSIS — R7303 Prediabetes: Secondary | ICD-10-CM | POA: Diagnosis not present

## 2022-07-16 DIAGNOSIS — E7801 Familial hypercholesterolemia: Secondary | ICD-10-CM | POA: Diagnosis not present

## 2022-07-16 DIAGNOSIS — R262 Difficulty in walking, not elsewhere classified: Secondary | ICD-10-CM | POA: Insufficient documentation

## 2022-07-16 DIAGNOSIS — Z0001 Encounter for general adult medical examination with abnormal findings: Secondary | ICD-10-CM | POA: Diagnosis not present

## 2022-07-16 DIAGNOSIS — I129 Hypertensive chronic kidney disease with stage 1 through stage 4 chronic kidney disease, or unspecified chronic kidney disease: Secondary | ICD-10-CM | POA: Diagnosis not present

## 2022-07-16 DIAGNOSIS — I252 Old myocardial infarction: Secondary | ICD-10-CM | POA: Diagnosis not present

## 2022-07-16 NOTE — Addendum Note (Signed)
Addended by: Marlyn Corporal A on: 07/16/2022 10:47 AM   Modules accepted: Orders

## 2022-07-20 NOTE — Progress Notes (Signed)
Order(s) created erroneously. Erroneous order ID: [id number]  Order moved by: Leonia Corona  Order move date/time: 07/20/2022 4:48 PM  Source Patient: [id number]  Source Contact: 07/15/2022  Destination Patient: [id number]  Destination Contact: 07/15/2022

## 2022-08-07 ENCOUNTER — Ambulatory Visit: Payer: Medicare HMO | Admitting: Orthopedic Surgery

## 2022-08-07 ENCOUNTER — Encounter: Payer: Self-pay | Admitting: Orthopedic Surgery

## 2022-08-07 VITALS — BP 139/75 | HR 118 | Ht 75.0 in | Wt 200.0 lb

## 2022-08-07 DIAGNOSIS — M659 Synovitis and tenosynovitis, unspecified: Secondary | ICD-10-CM | POA: Diagnosis not present

## 2022-08-07 DIAGNOSIS — M25462 Effusion, left knee: Secondary | ICD-10-CM | POA: Diagnosis not present

## 2022-08-07 MED ORDER — PREDNISONE 10 MG PO TABS: 10.0000 mg | ORAL_TABLET | Freq: Two times a day (BID) | ORAL | 0 refills | Status: AC

## 2022-08-07 MED ORDER — METHYLPREDNISOLONE ACETATE 40 MG/ML IJ SUSP
40.0000 mg | Freq: Once | INTRAMUSCULAR | Status: AC
Start: 2022-08-07 — End: 2022-08-07
  Administered 2022-08-07: 40 mg via INTRA_ARTICULAR

## 2022-08-07 NOTE — Progress Notes (Signed)
Chief Complaint  Patient presents with   Hand Problem    Left pinky finger    Joint Swelling    Bilateral knee pain chronic    77 year old male with bilateral knee effusions today the left is swollen and starting to have some discomfort the right is fine  He has pain left small finger over the A1 pulley complains of pain when he tries to bend his tenderness over the A1 pulley no flexor tendon rupture or lag  No warmth to the joint no erythema fairly medium to large size effusion aspiration injection left knee   Encounter Diagnoses  Name Primary?   Flexor tenosynovitis of finger Yes   Synovitis of knee    Effusion of knee joint, left      Start Deltasone 10 mg p.o. twice daily for the finger  Procedure note injection and aspiration left knee joint  Verbal consent was obtained to aspirate and inject the left knee joint   Timeout was completed to confirm the site of aspiration and injection  An 18-gauge needle was used to aspirate the left knee joint from a suprapatellar lateral approach.  The medications used were 40 mg of Depo-Medrol and 1% lidocaine 3 cc  Anesthesia was provided by ethyl chloride and the skin was prepped with alcohol.  After cleaning the skin with alcohol an 18-gauge needle was used to aspirate the right knee joint.  We obtained 12 cc of fluid CLR YELLOW   We followed this by injection of 40 mg of Depo-Medrol and 3 cc 1% lidocaine.  There were no complications. A sterile bandage was applied.

## 2022-08-07 NOTE — Patient Instructions (Signed)
Start Deltasone 10 mg twice a day for the finger  Come back as needed for symptoms regarding your knee   Procedure:  You have received an injection of steroids into the joint. 15% of patients will have increased pain within the 24 hours postinjection.   This is transient and will go away.   We recommend that you use ice packs on the injection site for 20 minutes every 2 hours and extra strength Tylenol 2 tablets every 8 as needed until the pain resolves.  If you continue to have pain after taking the Tylenol and using the ice please call the office for further instructions.

## 2022-08-18 DIAGNOSIS — H43393 Other vitreous opacities, bilateral: Secondary | ICD-10-CM | POA: Diagnosis not present

## 2022-09-16 ENCOUNTER — Ambulatory Visit: Payer: Medicare HMO | Admitting: Internal Medicine

## 2022-09-29 IMAGING — DX DG KNEE 3 VIEWS*L*
3 series · 3 of 3 positions shown · non-contrast
Comparison: 01/18/2021

CLINICAL DATA: Left knee pain.

EXAM:
LEFT KNEE - 3 VIEW

[knee ap]
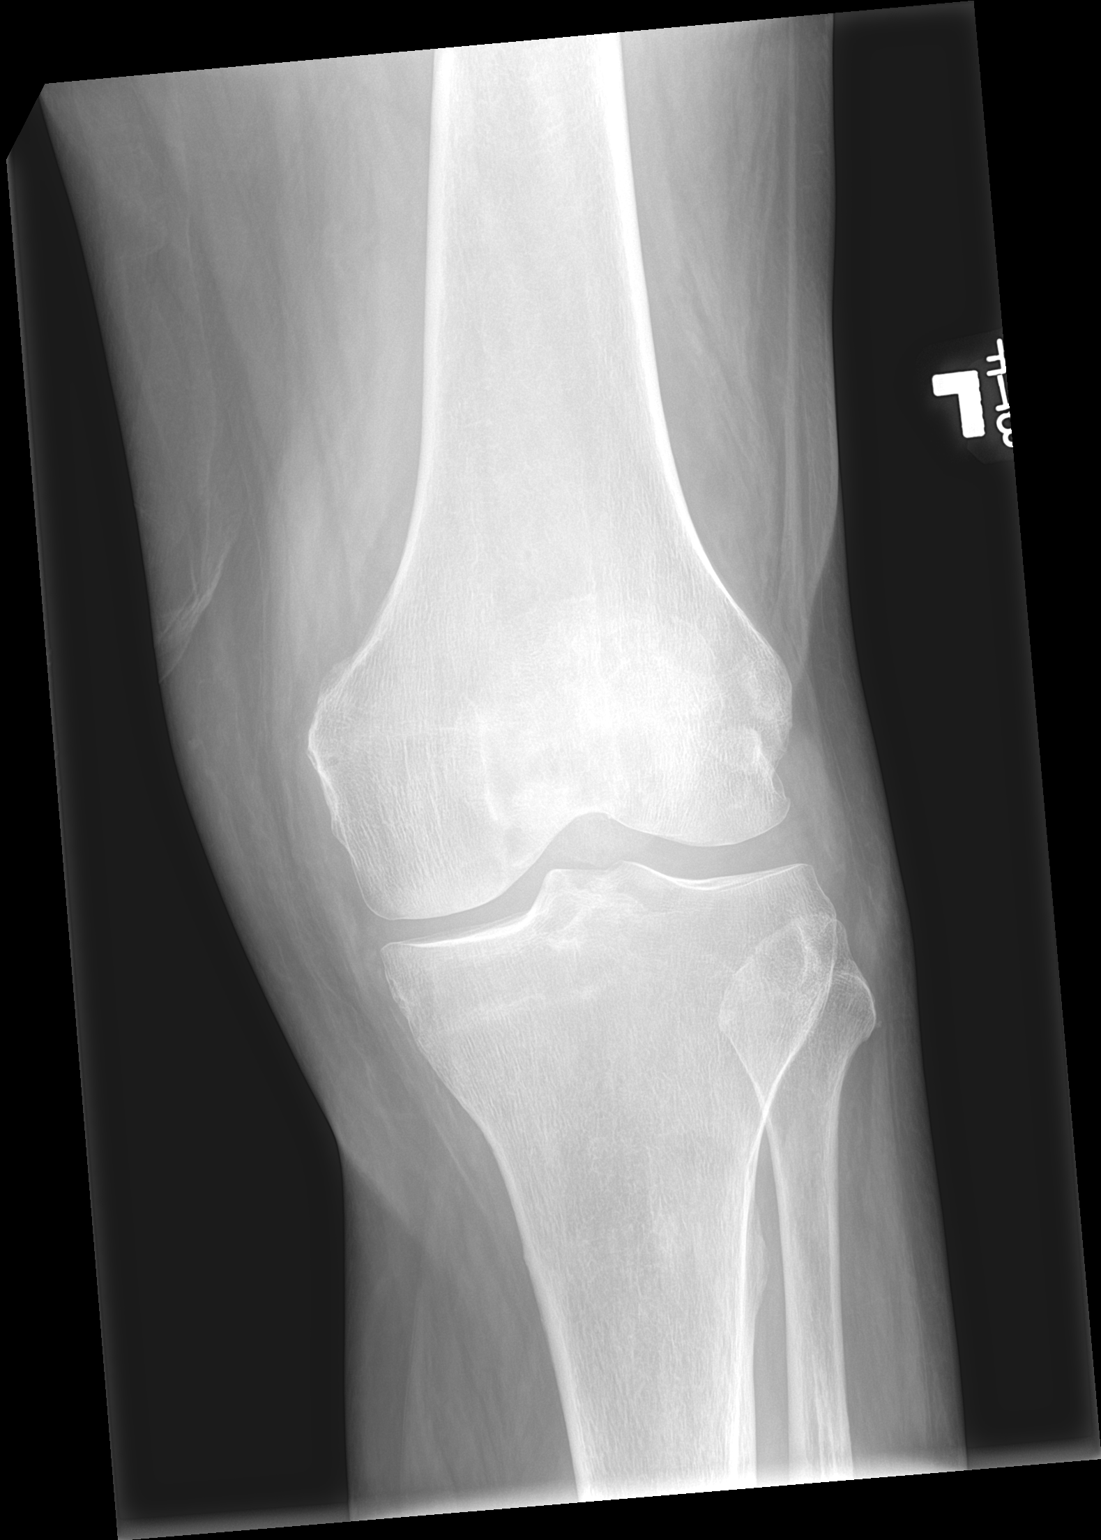

[knee lat]
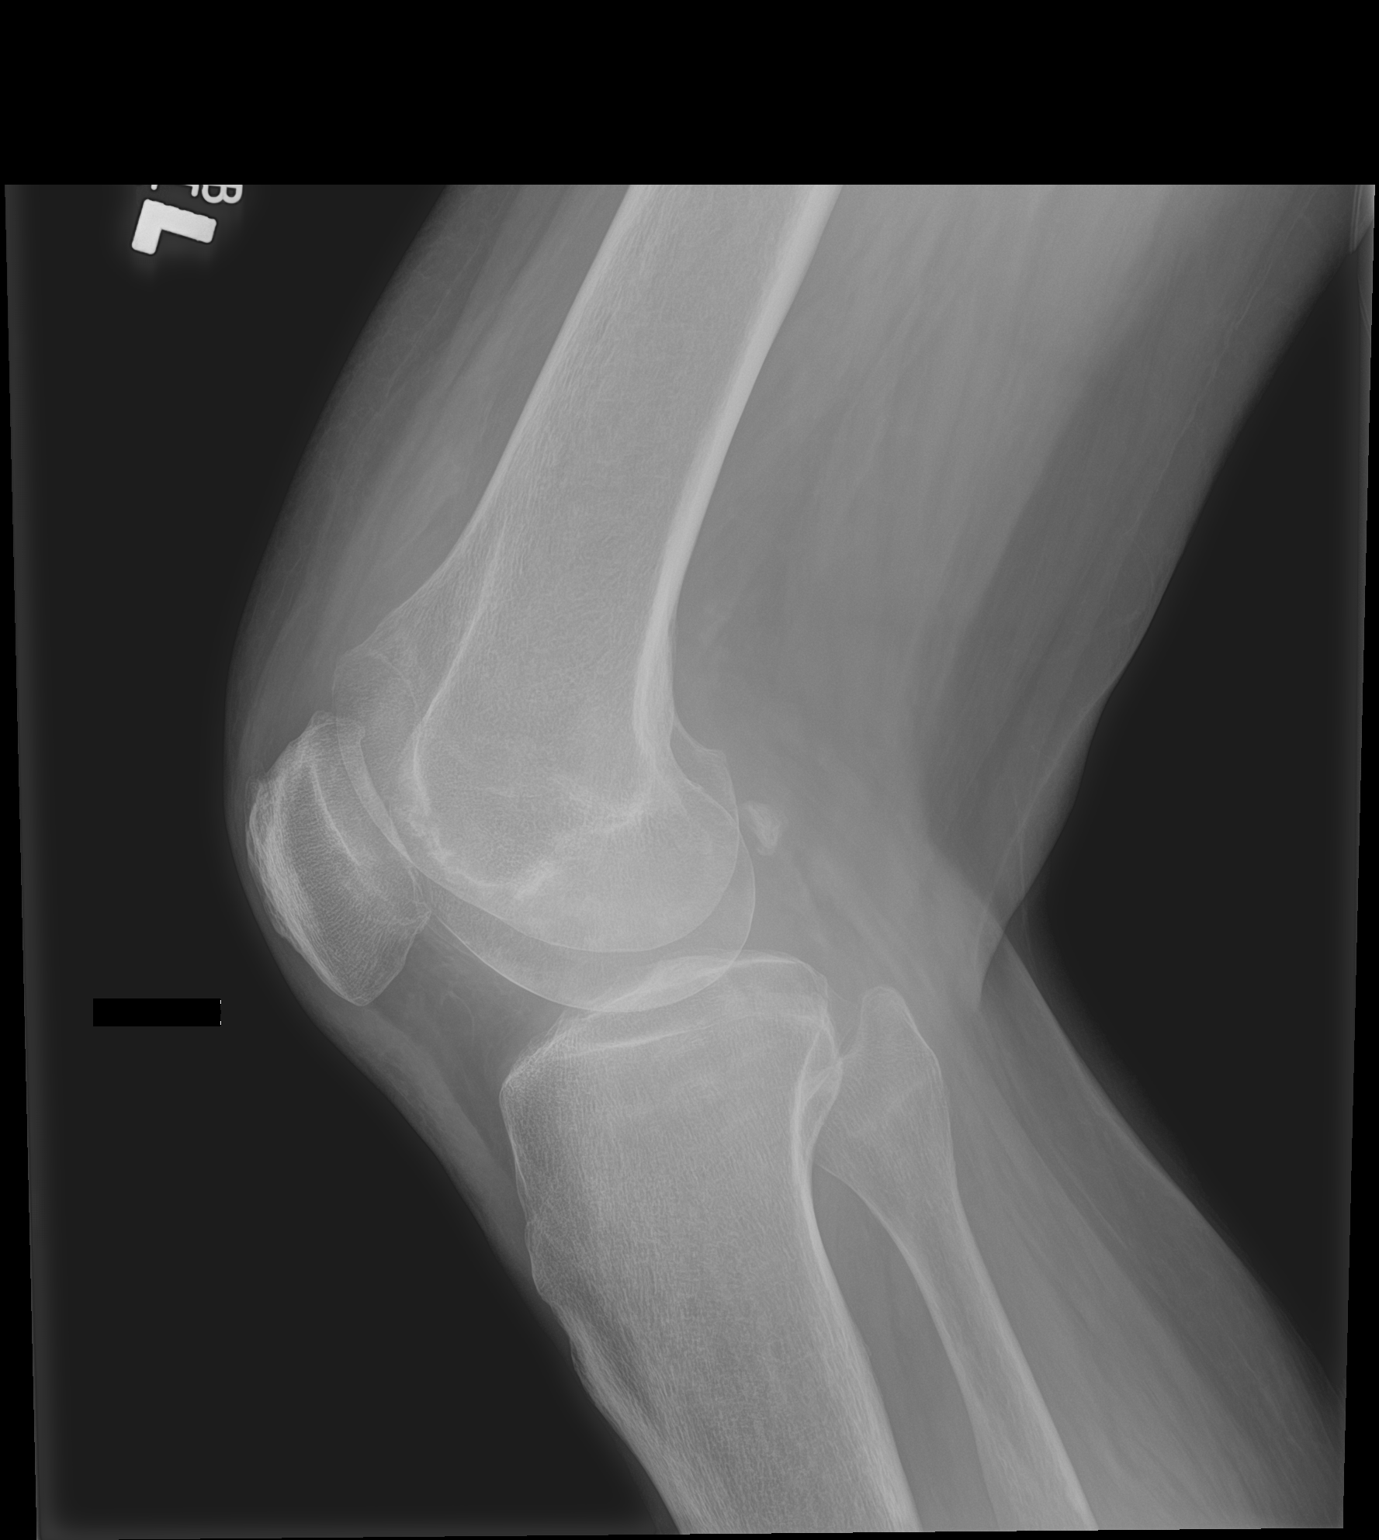

[patella skyline]
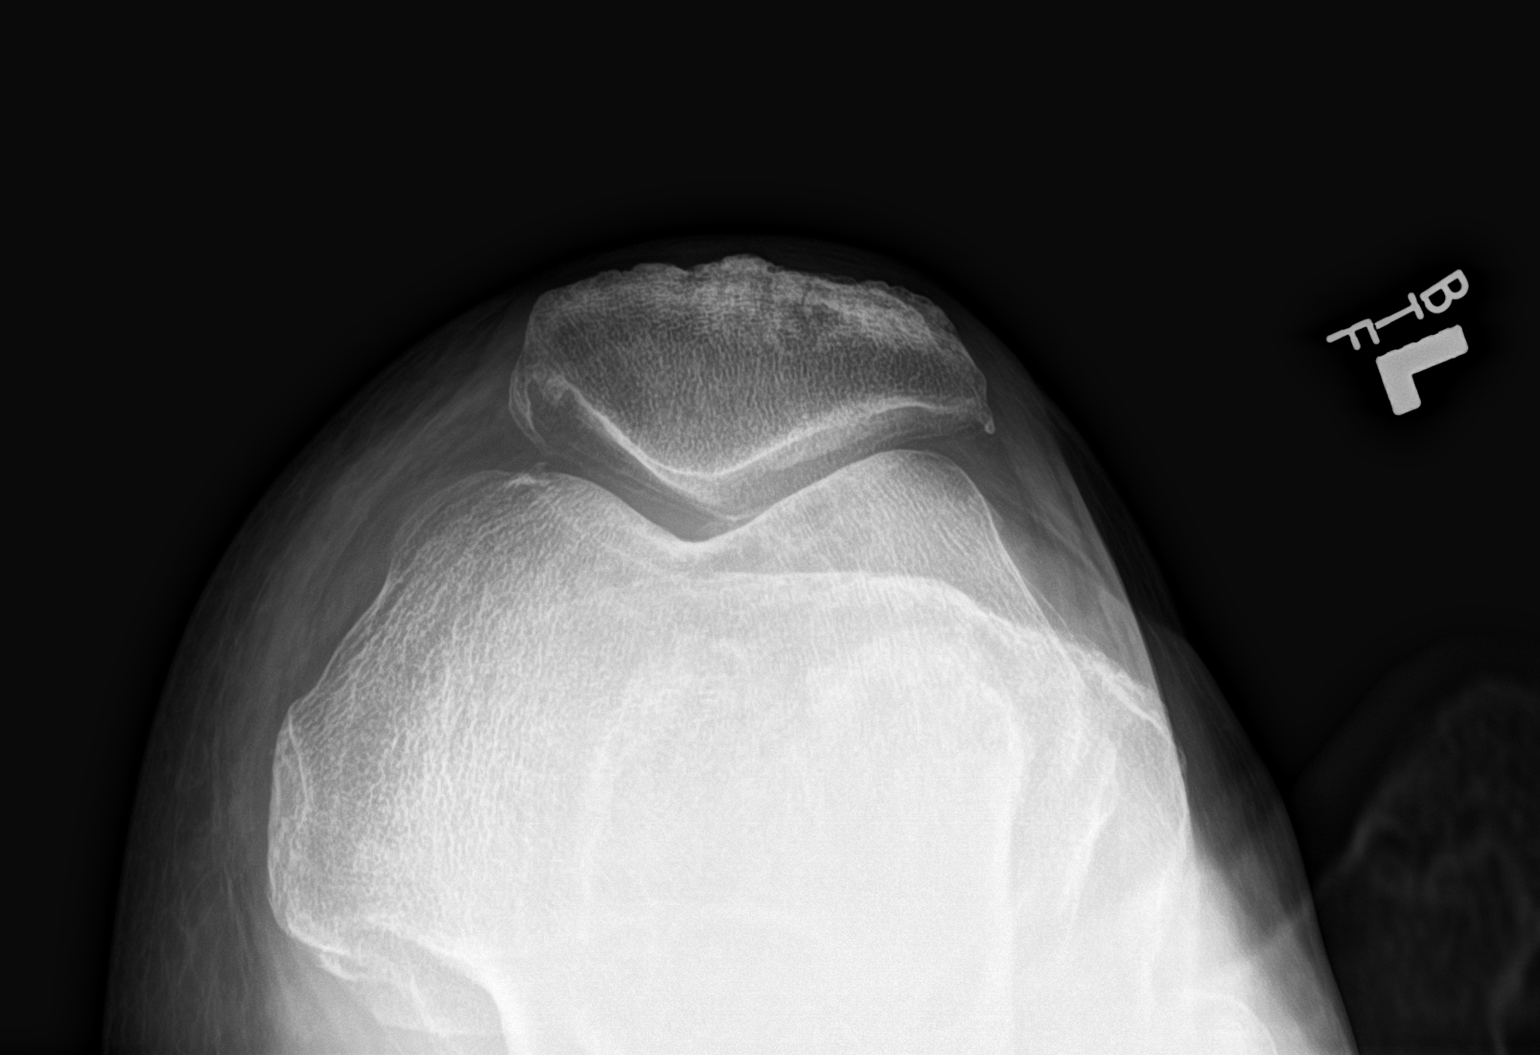

[3 of 3 positions shown; findings below may reference images not displayed]

FINDINGS: There appears to be resolution of the prior moderate knee joint
effusion. Mild superior patellar degenerative osteophytosis,
unchanged. Mild medial and lateral patellar osteophytosis. The
medial and lateral compartment joint spaces are maintained. Possible
minimal patellofemoral joint space narrowing. No acute fracture or
dislocation.
IMPRESSION: :
IMPRESSION: 1. Resolution of the prior joint effusion.
2. Mild patellofemoral osteoarthritis.

## 2022-09-29 IMAGING — DX DG CHEST 1V PORT
1 series · 1 of 1 positions shown · non-contrast
Comparison: 01/24/2021

CLINICAL DATA: Short of breath and weakness

EXAM:
PORTABLE CHEST 1 VIEW

[chest ap]
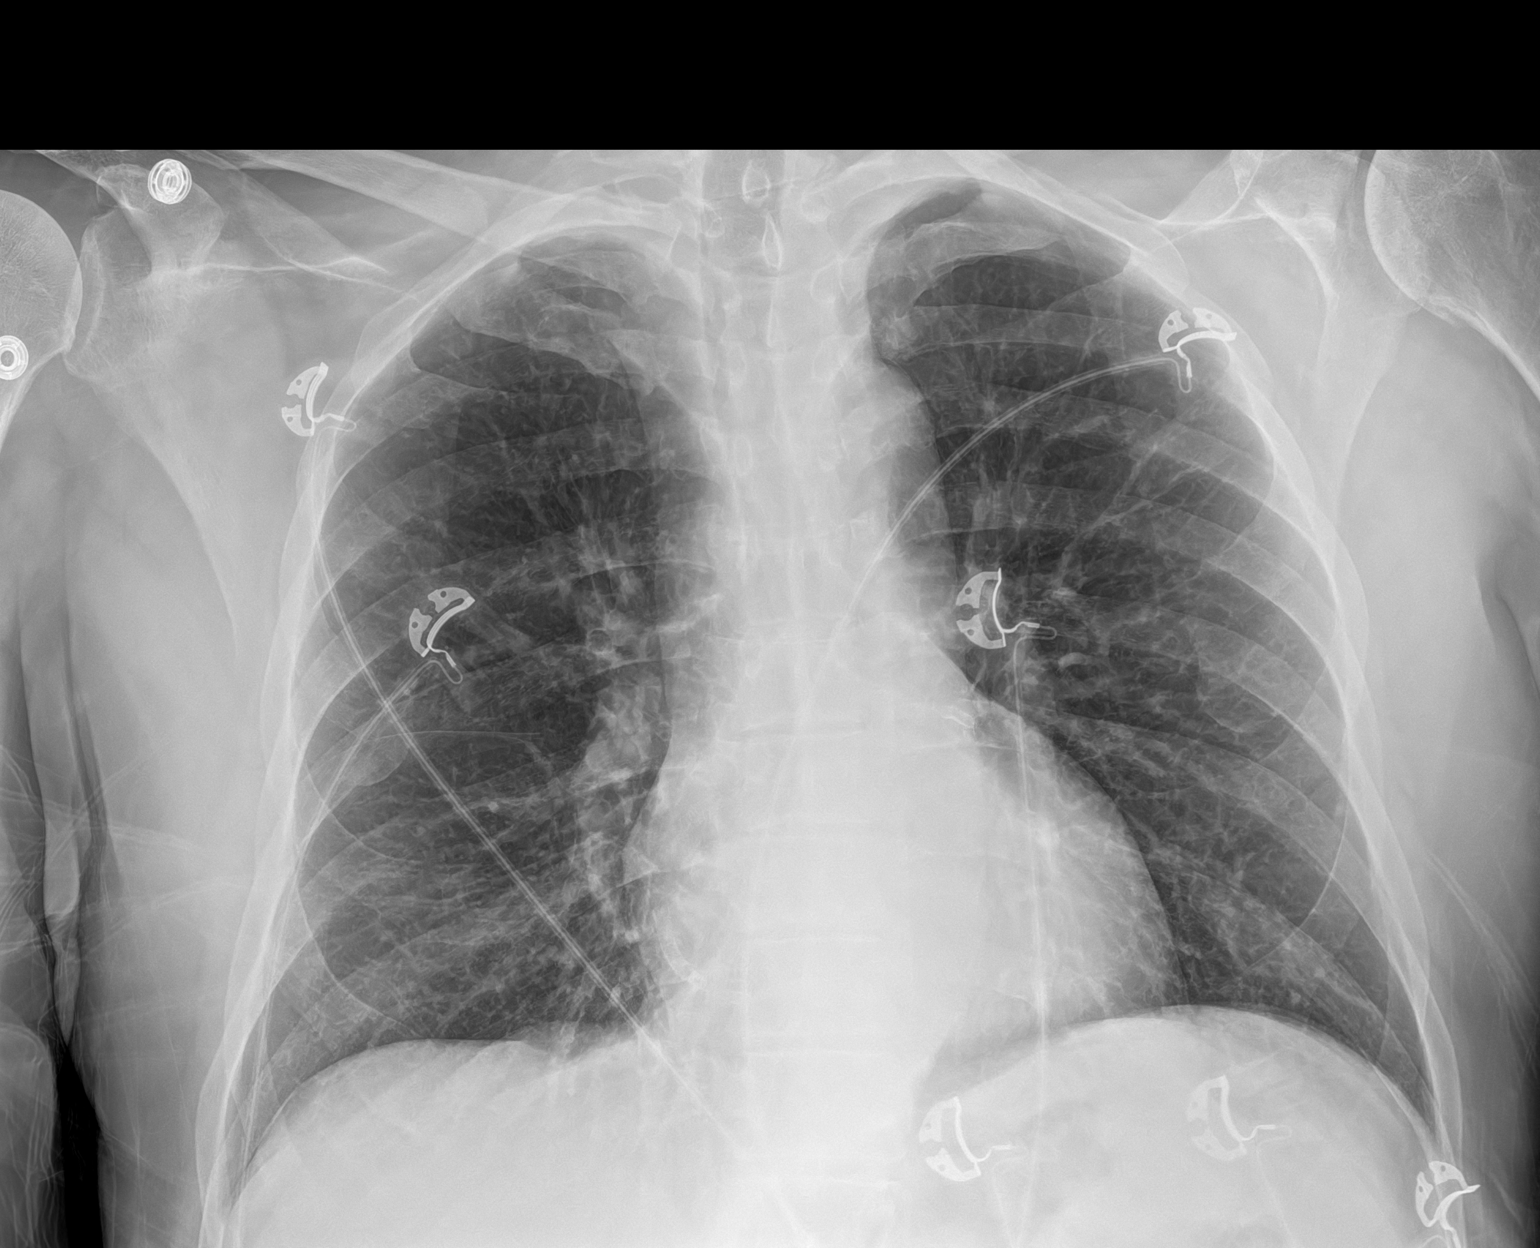

[1 of 1 positions shown; findings below may reference images not displayed]

FINDINGS: The heart size and mediastinal contours are within normal limits.
Both lungs are clear. The visualized skeletal structures are
unremarkable.
IMPRESSION: No active disease.

## 2022-09-30 IMAGING — US US EXTREM LOW*L* LIMITED
1 series · 6 of 6 positions shown · non-contrast
Comparison: LEFT knee radiographs 02/19/2021

CLINICAL DATA: LEFT knee joint effusion, question gout

EXAM:
ULTRASOUND LEFT LOWER EXTREMITY LIMITED
TECHNIQUE: Ultrasound examination of the lower extremity soft tissues was
performed in the area of clinical concern, the LEFT knee joint.

[Series 1: thyroid us · 6 of 6 slices shown]
[im 1/6]
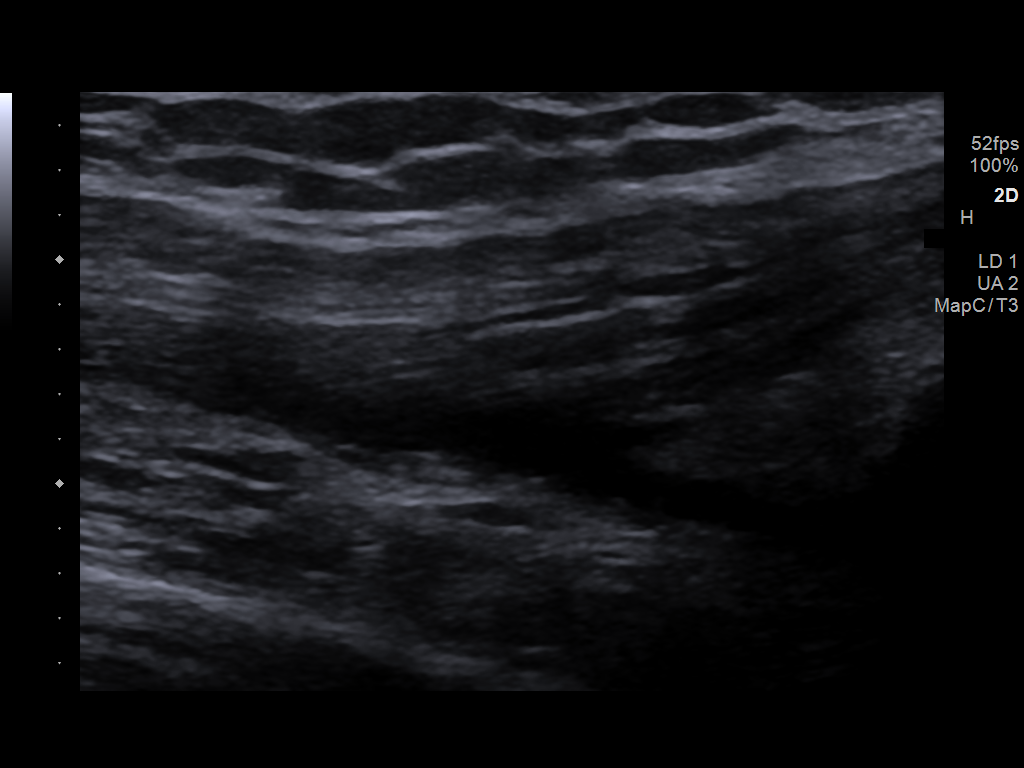
[im 2/6]
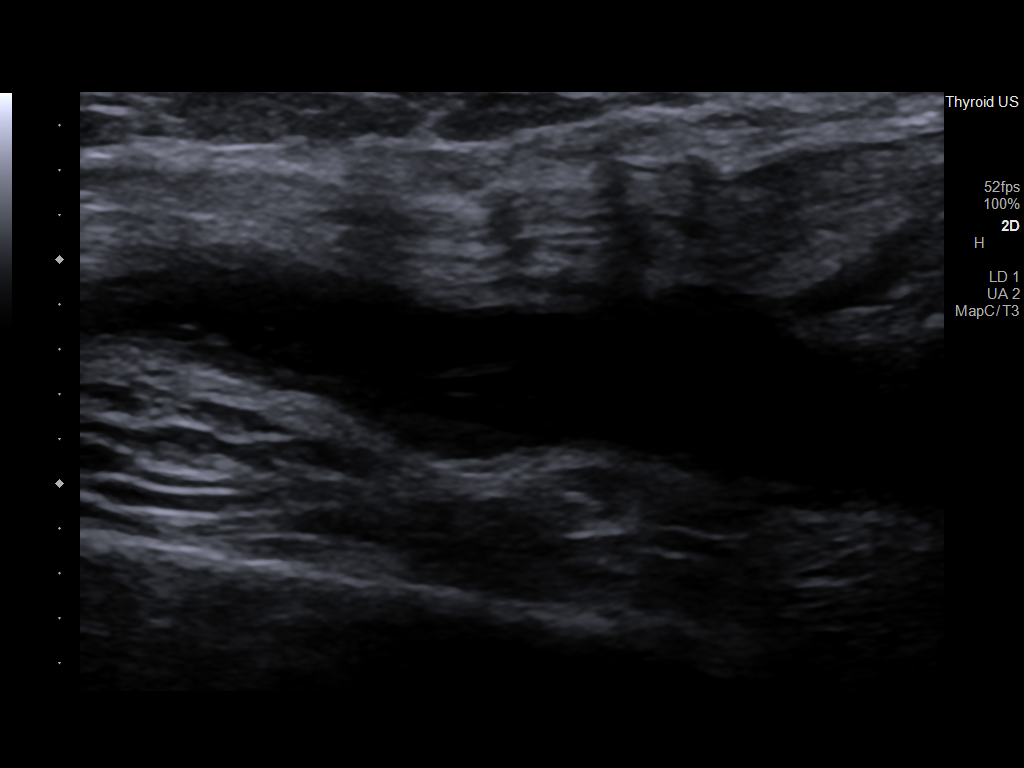
[im 3/6]
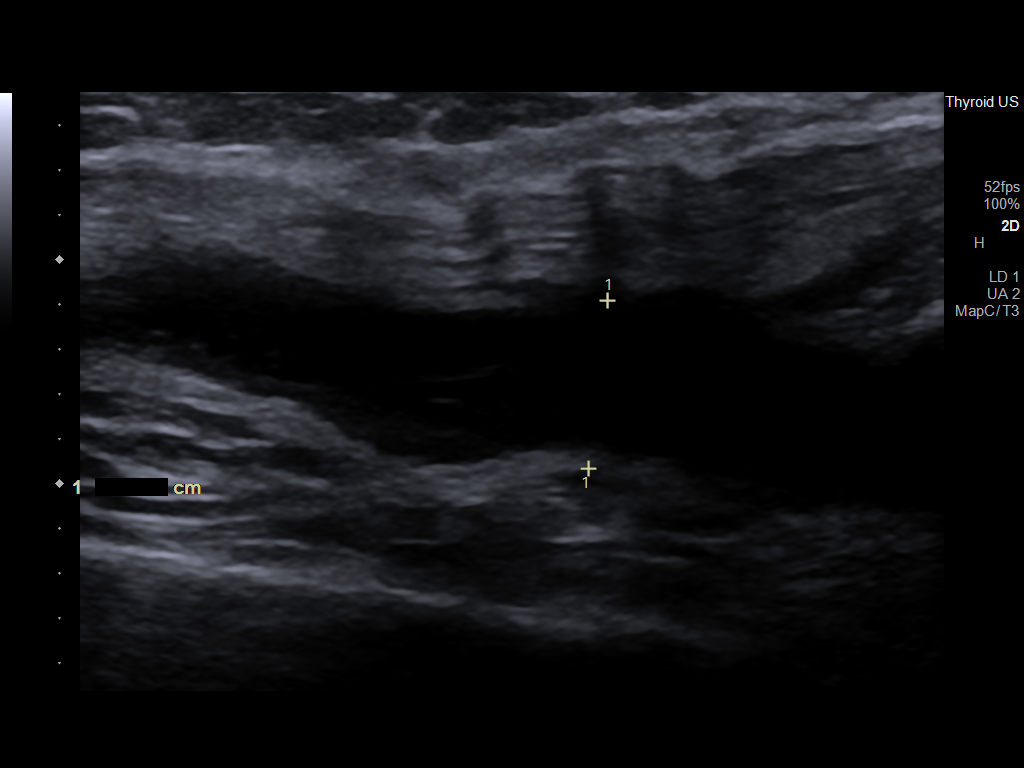
[im 4/6]
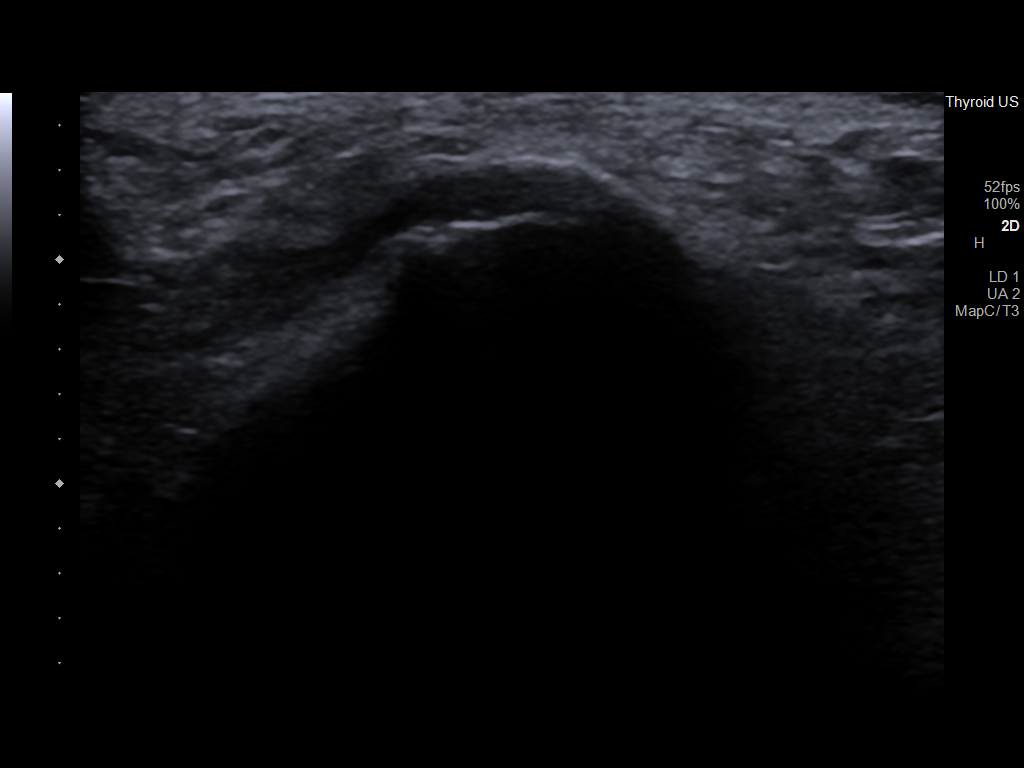
[im 5/6]
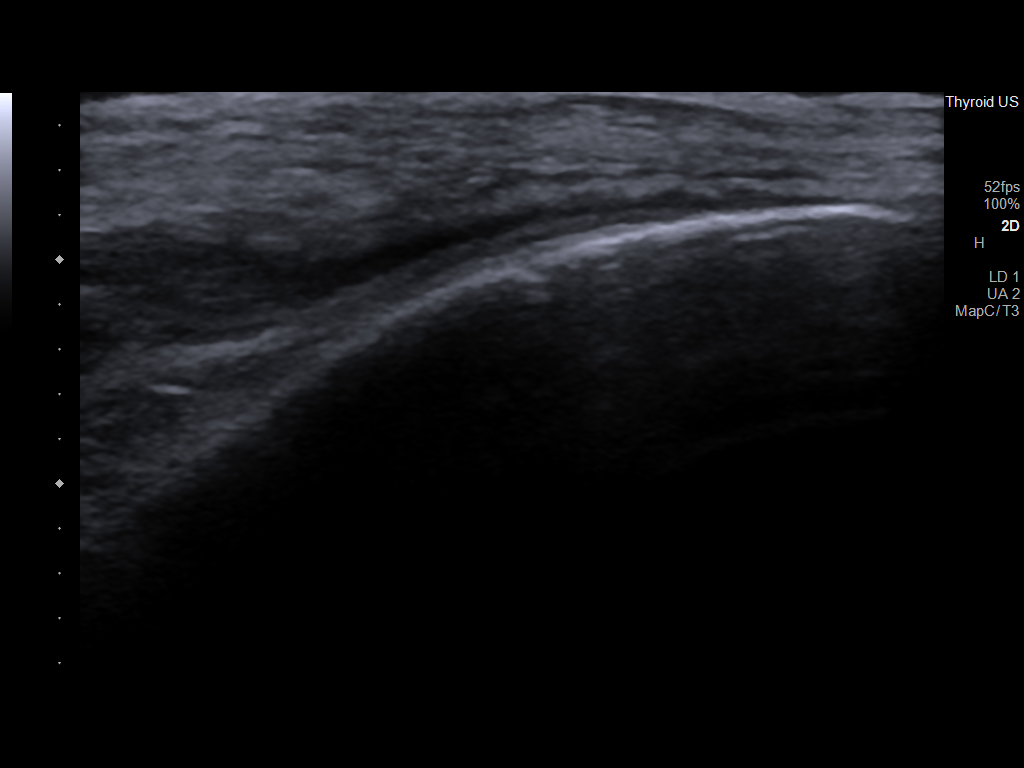
[im 6/6]
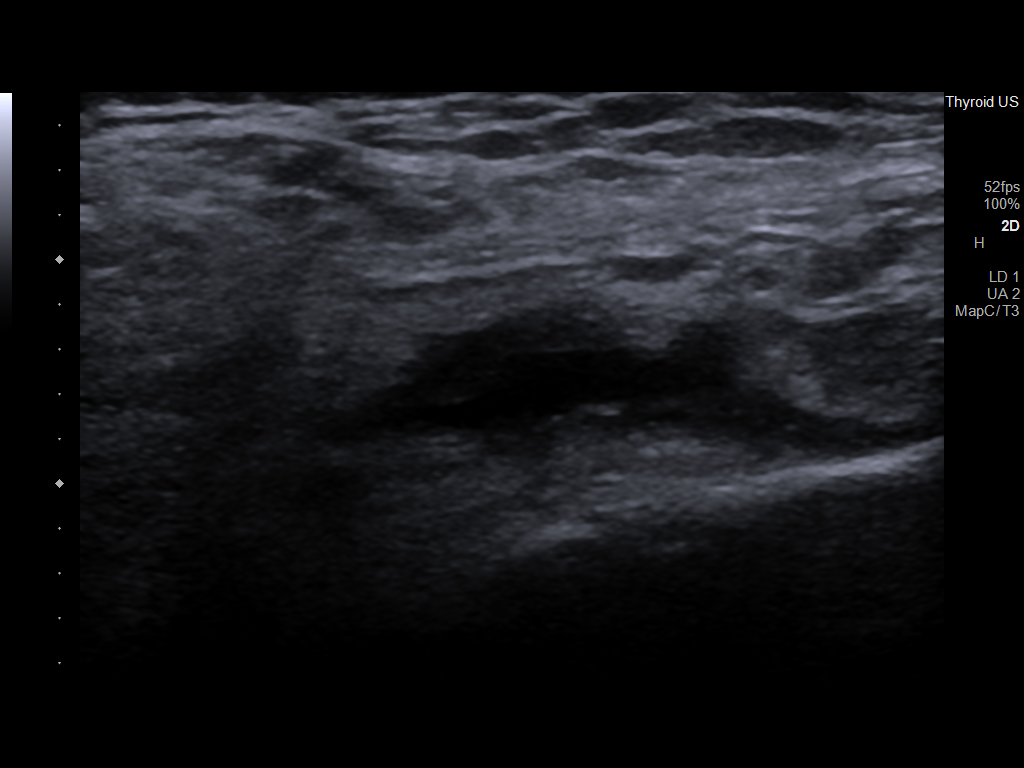

[6 of 6 positions shown; findings below may reference images not displayed]

FINDINGS: Minimal joint effusion at suprapatellar recess.

No additional joint fluid identified.

No adequate pocket of fluid is seen to allow for arthrocentesis.
IMPRESSION: Minimal joint effusion, insufficient for arthrocentesis.

## 2022-10-27 DIAGNOSIS — I10 Essential (primary) hypertension: Secondary | ICD-10-CM | POA: Diagnosis not present

## 2022-10-27 DIAGNOSIS — E785 Hyperlipidemia, unspecified: Secondary | ICD-10-CM | POA: Diagnosis not present

## 2022-10-27 DIAGNOSIS — R7303 Prediabetes: Secondary | ICD-10-CM | POA: Diagnosis not present

## 2022-10-27 DIAGNOSIS — D509 Iron deficiency anemia, unspecified: Secondary | ICD-10-CM | POA: Diagnosis not present

## 2022-11-02 DIAGNOSIS — I129 Hypertensive chronic kidney disease with stage 1 through stage 4 chronic kidney disease, or unspecified chronic kidney disease: Secondary | ICD-10-CM | POA: Diagnosis not present

## 2022-11-02 DIAGNOSIS — E7801 Familial hypercholesterolemia: Secondary | ICD-10-CM | POA: Diagnosis not present

## 2022-11-02 DIAGNOSIS — R7303 Prediabetes: Secondary | ICD-10-CM | POA: Diagnosis not present

## 2022-11-02 DIAGNOSIS — I10 Essential (primary) hypertension: Secondary | ICD-10-CM | POA: Diagnosis not present

## 2022-11-02 DIAGNOSIS — I252 Old myocardial infarction: Secondary | ICD-10-CM | POA: Diagnosis not present

## 2022-11-02 DIAGNOSIS — N1831 Chronic kidney disease, stage 3a: Secondary | ICD-10-CM | POA: Diagnosis not present

## 2022-11-02 DIAGNOSIS — D509 Iron deficiency anemia, unspecified: Secondary | ICD-10-CM | POA: Diagnosis not present

## 2022-11-02 DIAGNOSIS — M25469 Effusion, unspecified knee: Secondary | ICD-10-CM | POA: Diagnosis not present

## 2022-11-02 DIAGNOSIS — N400 Enlarged prostate without lower urinary tract symptoms: Secondary | ICD-10-CM | POA: Diagnosis not present

## 2022-12-09 DIAGNOSIS — I252 Old myocardial infarction: Secondary | ICD-10-CM | POA: Diagnosis not present

## 2022-12-09 DIAGNOSIS — R319 Hematuria, unspecified: Secondary | ICD-10-CM | POA: Diagnosis not present

## 2022-12-09 DIAGNOSIS — R413 Other amnesia: Secondary | ICD-10-CM | POA: Diagnosis not present

## 2022-12-10 ENCOUNTER — Other Ambulatory Visit (HOSPITAL_COMMUNITY): Payer: Self-pay | Admitting: Nurse Practitioner

## 2022-12-10 DIAGNOSIS — R413 Other amnesia: Secondary | ICD-10-CM

## 2022-12-21 ENCOUNTER — Ambulatory Visit (HOSPITAL_COMMUNITY)
Admission: RE | Admit: 2022-12-21 | Discharge: 2022-12-21 | Disposition: A | Discharge status: Home / Self Care | Payer: Medicare HMO | Source: Ambulatory Visit | Attending: Nurse Practitioner | Admitting: Nurse Practitioner

## 2022-12-21 DIAGNOSIS — I6523 Occlusion and stenosis of bilateral carotid arteries: Secondary | ICD-10-CM | POA: Diagnosis not present

## 2022-12-21 DIAGNOSIS — R413 Other amnesia: Secondary | ICD-10-CM | POA: Diagnosis not present

## 2023-01-21 ENCOUNTER — Telehealth: Payer: Self-pay | Admitting: Neurology

## 2023-01-21 NOTE — Telephone Encounter (Signed)
Do not have transportation to get [employer]. Will discuss with PCP on what to do about the referral

## 2023-02-08 ENCOUNTER — Telehealth: Payer: Self-pay | Admitting: Internal Medicine

## 2023-02-08 NOTE — Telephone Encounter (Signed)
 Pt came in office today and stated that he is ready to do the testing Dr. Jenene Slicker wanted him to get. I do not see any active orders that I can schedule from.

## 2023-02-08 NOTE — Telephone Encounter (Signed)
 Chart reviewed and according to last office visit no test were ordered. Called to notify pt. No answer. Left msg to call back.

## 2023-02-08 NOTE — Telephone Encounter (Signed)
 Spoke with pt and test to be done was a chest CT in 2023. At last visit no test was ordered. Pt will follow up a next visit.

## 2023-02-12 DIAGNOSIS — I10 Essential (primary) hypertension: Secondary | ICD-10-CM | POA: Diagnosis not present

## 2023-02-12 DIAGNOSIS — R7303 Prediabetes: Secondary | ICD-10-CM | POA: Diagnosis not present

## 2023-02-12 DIAGNOSIS — D509 Iron deficiency anemia, unspecified: Secondary | ICD-10-CM | POA: Diagnosis not present

## 2023-02-18 DIAGNOSIS — R809 Proteinuria, unspecified: Secondary | ICD-10-CM | POA: Diagnosis not present

## 2023-02-18 DIAGNOSIS — E7801 Familial hypercholesterolemia: Secondary | ICD-10-CM | POA: Diagnosis not present

## 2023-02-18 DIAGNOSIS — D509 Iron deficiency anemia, unspecified: Secondary | ICD-10-CM | POA: Diagnosis not present

## 2023-02-18 DIAGNOSIS — R7303 Prediabetes: Secondary | ICD-10-CM | POA: Diagnosis not present

## 2023-02-18 DIAGNOSIS — N1831 Chronic kidney disease, stage 3a: Secondary | ICD-10-CM | POA: Diagnosis not present

## 2023-02-18 DIAGNOSIS — I252 Old myocardial infarction: Secondary | ICD-10-CM | POA: Diagnosis not present

## 2023-02-18 DIAGNOSIS — N400 Enlarged prostate without lower urinary tract symptoms: Secondary | ICD-10-CM | POA: Diagnosis not present

## 2023-02-18 DIAGNOSIS — I712 Thoracic aortic aneurysm, without rupture, unspecified: Secondary | ICD-10-CM | POA: Diagnosis not present

## 2023-02-18 DIAGNOSIS — I129 Hypertensive chronic kidney disease with stage 1 through stage 4 chronic kidney disease, or unspecified chronic kidney disease: Secondary | ICD-10-CM | POA: Diagnosis not present

## 2023-03-02 ENCOUNTER — Other Ambulatory Visit (HOSPITAL_COMMUNITY): Payer: Self-pay | Admitting: Nurse Practitioner

## 2023-03-02 DIAGNOSIS — R319 Hematuria, unspecified: Secondary | ICD-10-CM | POA: Diagnosis not present

## 2023-03-25 ENCOUNTER — Ambulatory Visit: Payer: Medicare HMO | Admitting: Neurology

## 2023-03-25 NOTE — Progress Notes (Signed)
 Cardiology Office Note    Date:  03/29/2023  ID:  Phillip Pace, DOB 09-13-1945, MRN [id number] PCP:  Benita Stabile, MD  Cardiologist:  Marjo Bicker, MD  Electrophysiologist:  None   Chief Complaint: f/u CAD  History of Present Illness: .    Phillip Pace is a 78 y.o. 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 Per chart review, last stress test 2016 showed a small defect in the basal inferoseptal, basal inferior, mid inferoseptal and mid inferior location that was fixed and consistent with prior MI with no ischemia and EF 50%. Last echo 2022 showed EF 45-50%, G1DD, small pericardial effusion, mild dilation of aortic root (42mm) - EF was stable compared to 2016 when it was 40-45%. The patient planned to get f/u imaging of his aortic root when he had caught up with bills. Of note non-contrasted CT 02/2021 showed aorta within normal limits for size. He has declined statin therapy due to hx of myalgias and did not want injectables.  Per Dr. Antoine Poche note 07/2022, "Patient was admitted in 03/2021 with hyponatremia, polyarthralgia, AKI and was discharged in stable condition.  He said he was sent home with hospice care in 2/23 to die at home. He then stopped taking all his blood pressure medications except allopurinol, pantoprazole and tamsulosin. He also takes aspirin 325 mg.  He said he felt a lot better after stopping metoprolol and lisinopril.  He changed his diet and lifestyle after 2/23 hospitalization following which he noticed significant improvement in his energy levels and also his blood pressure was normalized."  He is seen for follow-up today with his  partner [place]. He is also accompanied by their [place]. He reports he is doing fine and denies any CP, SOB, palpitations, edema, or any complaints. He is mildly tachycardic (HR upper 90s-low 100s) which looks to have been the case in the past as well. He reports he is having full fasting bloodwork with PCP tomorrow. He is averse to reintroducing any new medications. He had some issues with hematuria earlier this year but that resolved after he passed a kidney stone.  Labwork independently reviewed: LDXA 02/2023 Hgb 12.9 plt OK, K 4.4, Cr 1.34, LFTs ok except alk phos 125, trig 332, LDL 142 01/2022 K 3.5, Cr 8.41, Hgb 12, plt 188 03/2021 Mg 1.8, TSH OK, alb 2.4, AST ALT OK  ROS: .    Please see the history of present illness.  All other systems are reviewed and otherwise negative.  Studies Reviewed: Marland Kitchen    EKG:  EKG is ordered today, personally reviewed, demonstrating: EKG Interpretation Date/Time:  Monday March 29 2023 13:58:32 EST Ventricular Rate:  104 PR Interval:  166 QRS Duration:  102 QT Interval:  386 QTC Calculation: 507 R Axis:   42  Text Interpretation: Sinus tachycardia Cannot rule out Anterior infarct , age undetermined One PVC No significant change was found Confirmed by Ronie Spies [phone]) on 03/29/2023 2:14:08 PM   CV Studies: Cardiac studies reviewed are outlined and summarized above. Otherwise please see EMR for full report.   Current Reported Medications:.    Current Meds  Medication Sig   acetaminophen (TYLENOL) 325 MG tablet Take 2 tablets (650  mg total) by mouth every 6 (six) hours as needed for mild pain (or Fever >/= 101).   allopurinol (ZYLOPRIM) 100 MG tablet Take 100 mg by mouth daily.   ascorbic acid (VITAMIN C) 100 MG tablet Take by mouth.   aspirin EC 81 MG tablet Take 81 mg by mouth daily. Swallow whole.   b complex vitamins capsule Take 1 capsule by mouth daily.   Cholecalciferol (VITAMIN D3) 5000 units CAPS Take 5,000 Units by mouth daily.    HYDROcodone-acetaminophen (NORCO/VICODIN) 5-325 MG tablet    NITROSTAT 0.4 MG SL tablet Place 0.4 mg under the tongue every 5 (five) minutes as needed.   Omega-3 Fatty Acids (FISH OIL) 1000 MG CAPS Take 2 capsules by mouth 2 (two) times daily.   ondansetron (ZOFRAN-ODT) 8 MG disintegrating tablet Take 8 mg by mouth 2 (two) times daily as needed for nausea or vomiting.   pantoprazole (PROTONIX) 40 MG tablet Take 1 tablet (40 mg total) by mouth 2 (two) times daily. TAKE 1 TABLET(40 MG) BY MOUTH TWICE DAILY BEFORE A MEAL Strength: 40 mg   traMADol (ULTRAM) 50 MG tablet Take 50 mg by mouth 2 (two) times daily.    Physical Exam:    VS:  BP 120/68 (BP Location: Right Arm, Patient Position: Sitting, Cuff Size: Normal)   Pulse (!) 104   Ht 6\' 3"  (1.905 m)   Wt 207 lb (93.9 kg)   BMI 25.87 kg/m    Wt Readings from Last 3 Encounters:  03/29/23 207 lb (93.9 kg)  08/07/22 200 lb (90.7 kg)  07/15/22 200 lb (90.7 kg)    GEN: Well nourished, well developed in no acute distress NECK: No JVD; No carotid bruits CARDIAC: RRR, mildly elevated HR, no murmurs, rubs, gallops RESPIRATORY:  Clear to auscultation without rales, wheezing or rhonchi  ABDOMEN: Soft, non-tender, non-distended EXTREMITIES:  No edema; No acute deformity   Asessement and Plan:.    1. CAD, HLD - asymptomatic. Last PCI was about 20 years ago per his report. Continue ASA. He does not wish to add any additional medication at this time. He is not interested in injectibles. We discussed Nexletol as an option for lipid management and he'd like to hold off.  2. Aortic root dilation - Dr. Jenene Slicker previously discussed repeat imaging; he deferred due to cost. I would like to get a f/u echo for his EF and sinus tach which he is agreeable to getting later this year. This may give [place] an updated dimension of the aorta. Note non-gated CT in 07-Apr-2021 showed normal aortic dimension.  3. Chronic HFmrEF, HTN - appears euvolemic, not on GDMT - reports  he almost died in 04-07-2021 while on BP meds and does not wish to revisit at this time. Given his sinus tachycardia I would like to pursue updated echo. He is agreeable to this.  4. Sinus tachycardia - patient declined labs today, states he's having full recheck with PCP tomorrow. I asked him to ensure they're getting a TSH with the labs, and have them send [place] a copy for our review. His CBC and BMET have been relatively stable when checked this year.   5. Small pericardial effusion - will see what echo shows when he goes for this. No clinical signs of tamponade.    Disposition: F/u with me in 6 months per patient request.  Signed, Laurann Montana, PA-C

## 2023-03-29 ENCOUNTER — Encounter: Payer: Self-pay | Admitting: Physician Assistant

## 2023-03-29 ENCOUNTER — Ambulatory Visit: Payer: Medicare HMO | Attending: Physician Assistant | Admitting: Physician Assistant

## 2023-03-29 VITALS — BP 120/68 | HR 104 | Ht 75.0 in | Wt 207.0 lb

## 2023-03-29 DIAGNOSIS — I10 Essential (primary) hypertension: Secondary | ICD-10-CM

## 2023-03-29 DIAGNOSIS — I3139 Other pericardial effusion (noninflammatory): Secondary | ICD-10-CM | POA: Diagnosis not present

## 2023-03-29 DIAGNOSIS — I5022 Chronic systolic (congestive) heart failure: Secondary | ICD-10-CM

## 2023-03-29 DIAGNOSIS — I2510 Atherosclerotic heart disease of native coronary artery without angina pectoris: Secondary | ICD-10-CM

## 2023-03-29 DIAGNOSIS — I77810 Thoracic aortic ectasia: Secondary | ICD-10-CM

## 2023-03-29 DIAGNOSIS — R000 Tachycardia, unspecified: Secondary | ICD-10-CM | POA: Diagnosis not present

## 2023-03-29 DIAGNOSIS — E785 Hyperlipidemia, unspecified: Secondary | ICD-10-CM

## 2023-03-29 NOTE — Patient Instructions (Signed)
 Medication Instructions:  Your physician recommends that you continue on your current medications as directed. Please refer to the Current Medication list given to you today.   Labwork: None today  Testing/Procedures: Your physician has requested that you have an echocardiogram. Echocardiography is a painless test that uses sound waves to create images of your heart. It provides your doctor with information about the size and shape of your heart and how well your heart's chambers and valves are working. This procedure takes approximately one hour. There are no restrictions for this procedure. Please do NOT wear cologne, perfume, aftershave, or lotions (deodorant is allowed). Please arrive 15 minutes prior to your appointment time.  Please note: We ask at that you not bring children with you during ultrasound (echo/ vascular) testing. Due to room size and safety concerns, children are not allowed in the ultrasound rooms during exams. Our front office staff cannot provide observation of children in our lobby area while testing is being conducted. An adult accompanying a patient to their appointment will only be allowed in the ultrasound room at the discretion of the ultrasound technician under special circumstances. We apologize for any inconvenience.   Follow-Up: 6 months with Dayna Dunn,PA-C  Any Other Special Instructions Will Be Listed Below (If Applicable).      PLEASE ASK DR HALL TO ADD TSH TO YOUR LAB WORK   If you need a refill on your cardiac medications before your next appointment, please call your pharmacy.

## 2023-04-08 ENCOUNTER — Ambulatory Visit (HOSPITAL_COMMUNITY): Payer: Medicare HMO

## 2023-04-08 ENCOUNTER — Encounter (HOSPITAL_COMMUNITY): Payer: Self-pay

## 2023-04-09 ENCOUNTER — Ambulatory Visit (HOSPITAL_COMMUNITY): Admission: RE | Admit: 2023-04-09 | Payer: Medicare HMO | Source: Ambulatory Visit

## 2023-04-13 DIAGNOSIS — L989 Disorder of the skin and subcutaneous tissue, unspecified: Secondary | ICD-10-CM | POA: Diagnosis not present

## 2023-04-16 ENCOUNTER — Ambulatory Visit (HOSPITAL_COMMUNITY)
Admission: RE | Admit: 2023-04-16 | Discharge: 2023-04-16 | Disposition: A | Discharge status: Home / Self Care | Source: Ambulatory Visit | Attending: Physician Assistant | Admitting: Physician Assistant

## 2023-04-16 DIAGNOSIS — I3139 Other pericardial effusion (noninflammatory): Secondary | ICD-10-CM | POA: Insufficient documentation

## 2023-04-16 DIAGNOSIS — I5022 Chronic systolic (congestive) heart failure: Secondary | ICD-10-CM | POA: Diagnosis not present

## 2023-04-16 LAB — ECHOCARDIOGRAM COMPLETE
AR max vel: 2.13 cm2
AV Area VTI: 2.14 cm2
AV Area mean vel: 1.87 cm2
AV Mean grad: 2 mmHg
AV Peak grad: 3.5 mmHg
Ao pk vel: 0.94 m/s
Area-P 1/2: 2.48 cm2
Calc EF: 33.9 %
S' Lateral: 4.9 cm
Single Plane A2C EF: 31.8 %
Single Plane A4C EF: 36.4 %

## 2023-04-16 NOTE — Progress Notes (Signed)
*  PRELIMINARY RESULTS* Echocardiogram 2D Echocardiogram has been performed.  [name] 04/16/2023, 9:22 AM

## 2023-05-13 ENCOUNTER — Ambulatory Visit: Attending: Internal Medicine | Admitting: Internal Medicine

## 2023-05-13 ENCOUNTER — Encounter: Payer: Self-pay | Admitting: Internal Medicine

## 2023-05-13 VITALS — BP 132/78 | HR 74 | Ht 75.0 in | Wt 203.6 lb

## 2023-05-13 DIAGNOSIS — I429 Cardiomyopathy, unspecified: Secondary | ICD-10-CM

## 2023-05-13 NOTE — Progress Notes (Signed)
 Cardiology Office Note  Date: 05/13/2023   ID: Phillip Pace, DOB Apr 04, 1945, MRN [id number]  PCP:  Benita Stabile, MD  Cardiologist:  Marjo Bicker, MD Electrophysiologist:  None   Reason for Office Visit: Follow-up visit of CAD   History of Present Illness: Phillip Pace is a 78 y.o. male known to have CAD with multiple stents (13) at [facility] (Dr. 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  His last documented stress test was in 2016 which showed a small defect in the basal inferoseptal, basal inferior, mid inferoseptal and mid inferior location that was fixed and consistent with prior MI with no ischemia and EF 50%.  Echocardiogram in 2022 showed LVEF 45 to 50%, RWMA, normal RV systolic function, aortic root 42 mm and no valvular disease.  Repeat echocardiogram in 2025 showed LVEF decreased to 30% and normal aortic diameter.  Patient was admitted in 03/2021 with hyponatremia, polyarthralgia, AKI and was discharged in stable condition.  He said he was sent home with hospice care in 2/23 to die at home. He then stopped taking all his blood pressure medications except allopurinol, pantoprazole and tamsulosin. He said he felt a lot better after stopping metoprolol and lisinopril.  He changed his diet and lifestyle after 2/23 hospitalization following which he noticed significant improvement in his energy levels and also his blood pressure was normalized.   I discussed the echo findings with him today.  He does not report any DOE or angina.  He does state that he feels tired.  No dizziness, palpitations, syncope, leg swelling.   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   Dilated aortic root (HCC)    42 mm by 2D echo 12/2020   Gout    Hypertension     Past Surgical History:  Procedure Laterality Date   COLONOSCOPY N/A 05/25/2017   Procedure: COLONOSCOPY;  Surgeon: Corbin Ade, MD;  [facility];  Service: Endoscopy;  Laterality: N/A;  12:00pm   ESOPHAGOGASTRODUODENOSCOPY N/A 05/25/2017   Procedure: ESOPHAGOGASTRODUODENOSCOPY (EGD);  Surgeon: Corbin Ade, MD;  [facility];  Service: Endoscopy;  Laterality: N/A;   FRACTURE SURGERY     collar bone   HERNIA REPAIR     x 2   MALONEY DILATION N/A 05/25/2017   Procedure: MALONEY DILATION;  Surgeon: Corbin Ade, MD;  Location: [facility];  Service: Endoscopy;  Laterality: N/A;   stents 13      Current Outpatient Medications  Medication Sig Dispense Refill   acetaminophen (TYLENOL) 325 MG tablet Take 2 tablets (650 mg total) by mouth every 6 (six) hours as needed for mild pain (or Fever >/= 101).     allopurinol (ZYLOPRIM) 100 MG tablet Take 100 mg by mouth daily.     ascorbic acid (VITAMIN C) 100 MG tablet Take by mouth.     aspirin EC 81 MG tablet Take 81 mg by mouth daily. Swallow whole.     b complex vitamins capsule Take 1  capsule by mouth daily.     Cholecalciferol (VITAMIN D3) 5000 units CAPS Take 5,000 Units by mouth daily.     HYDROcodone-acetaminophen (NORCO/VICODIN) 5-325 MG tablet      NITROSTAT 0.4 MG SL tablet Place 0.4 mg under the tongue every 5 (five) minutes as needed.  5   Omega-3 Fatty Acids (FISH OIL) 1000 MG CAPS Take 2 capsules by mouth 2 (two) times daily.     ondansetron (ZOFRAN-ODT) 8 MG disintegrating tablet Take 8 mg by mouth 2 (two) times daily as needed for nausea or vomiting.     pantoprazole (PROTONIX) 40 MG tablet Take 1 tablet (40 mg total) by mouth 2 (two) times daily. TAKE 1  TABLET(40 MG) BY MOUTH TWICE DAILY BEFORE A MEAL Strength: 40 mg     traMADol (ULTRAM) 50 MG tablet Take 50 mg by mouth 2 (two) times daily.     No current facility-administered medications for this visit.   Allergies:  Metoprolol, Penicillins, and Shellfish allergy   Social History: The patient  reports that he quit smoking about 30 years ago. His smoking use included cigarettes. He has never used smokeless tobacco. He reports that he does not drink alcohol and does not use drugs.   Family History: The patient's family history includes CAD in his mother; Cancer - Colon in his mother; Colon cancer in his father.   ROS:  Please see the history of present illness. Otherwise, complete review of systems is positive for none.  All other systems are reviewed and negative.   Physical Exam: VS:  There were no vitals taken for this visit., BMI There is no height or weight on file to calculate BMI.  Wt Readings from Last 3 Encounters:  03/29/23 207 lb (93.9 kg)  08/07/22 200 lb (90.7 kg)  07/15/22 200 lb (90.7 kg)    General: Patient appears comfortable at rest. HEENT: Conjunctiva and lids normal, oropharynx clear with moist mucosa. Neck: Supple, no elevated JVP or carotid bruits, no thyromegaly. Lungs: Clear to auscultation, nonlabored breathing at rest. Cardiac: Regular rate and rhythm, no S3 or significant systolic murmur, no pericardial rub. Abdomen: Soft, nontender, no hepatomegaly, bowel sounds present, no guarding or rebound. Extremities: No pitting edema, distal pulses 2+. Skin: Warm and dry. Musculoskeletal: No kyphosis. Neuropsychiatric: Alert and oriented x3, affect grossly appropriate.  ECG:  An ECG dated 01/02/2022 was personally reviewed today and demonstrated:  Uninterpretable rhythm  Recent Labwork: No results found for requested labs within last 365 days.     Component Value Date/Time   CHOL 161 11/12/2020 1103   TRIG 347 (H) 11/12/2020 1103   HDL 41 11/12/2020 1103    CHOLHDL 3.9 11/12/2020 1103   VLDL 69 (H) 11/12/2020 1103   LDLCALC 51 11/12/2020 1103    Other Studies Reviewed Today: Echo from 12/2020 LVEF 45 to 50% Grade 1 diastolic dysfunction LV systolic function is normal Small pericardial effusion Aortic root dilatation 42 mm  Assessment and Plan:  # Cardiomyopathy, unclear etiology -LVEF was 45 to 50% previously in 2017 that dropped to 30% in 2025.  Does not report any angina or DOE but does feel tired.  I discussed the echo findings with him today.  I strongly recommended to start GDMT to improve LVEF.  He wants to talk to his family doctor in [place] about BB, ACE/ARB/Entresto and MRA.  I will have these names printed on his after visit summary.  When he makes up his mind to start these medications, he will  call us.  I will see him back in 3 months.  In the meantime, will obtain Lexiscan for ischemia evaluation.  He believes that his LVEF will improve without GDMT and requested to obtain limited echocardiogram in late August.  Informed him that's not how it will work.  Will obtain limited echocardiogram for late August.  # CAD status post multiple PCI in [place] with LVEF 30% -Continue aspirin 81 mg once daily -Intolerant to statins.  Refused injectables.  # Aortic root dilatation 42 mm -Echocardiogram did not reveal any aortic dilatation.  # HTN -Stop taking all his medications.  Will need to start GDMT for cardiomyopathy.  He will reach out to [place] after he makes up his mind.     Medication Adjustments/Labs and Tests Ordered: Current medicines are reviewed at length with the patient today.  Concerns regarding medicines are outlined above.   Tests Ordered: No orders of the defined types were placed in this encounter.   Medication Changes: No orders of the defined types were placed in this encounter.   Disposition:  Follow up 3 months  Signed, Efrain Clauson Verne Spurr, MD, 05/13/2023 12:49 PM    [facility]  at [facility] [address], [place], [place] [address]

## 2023-05-13 NOTE — Patient Instructions (Addendum)
 Medication Instructions:  Your physician recommends that you continue on your current medications as directed. Please refer to the Current Medication list given to you today.   Labwork: None  Testing/Procedures: Your physician has requested that you have a lexiscan myoview. For further information please visit [email or web address] Please follow instruction sheet, as given.  Your physician has requested that you have an echocardiogram. Echocardiography is a painless test that uses sound waves to create images of your heart. It provides your doctor with information about the size and shape of your heart and how well your heart's chambers and valves are working. This procedure takes approximately one hour. There are no restrictions for this procedure. Please do NOT wear cologne, perfume, aftershave, or lotions (deodorant is allowed). Please arrive 15 minutes prior to your appointment time.  Please note: We ask at that you not bring children with you during ultrasound (echo/ vascular) testing. Due to room size and safety concerns, children are not allowed in the ultrasound rooms during exams. Our front office staff cannot provide observation of children in our lobby area while testing is being conducted. An adult accompanying a patient to their appointment will only be allowed in the ultrasound room at the discretion of the ultrasound technician under special circumstances. We apologize for any inconvenience.   Follow-Up: Your physician recommends that you schedule a follow-up appointment in: 3 months or 6 months  Any Other Special Instructions Will Be Listed Below (If Applicable). Metoprolol, Losartan, Lisinopril, Entresto and Spironolactone   Thank you for choosing [facility]!     If you need a refill on your cardiac medications before your next appointment, please call your pharmacy.

## 2023-05-28 DIAGNOSIS — N400 Enlarged prostate without lower urinary tract symptoms: Secondary | ICD-10-CM | POA: Diagnosis not present

## 2023-05-28 DIAGNOSIS — R7303 Prediabetes: Secondary | ICD-10-CM | POA: Diagnosis not present

## 2023-05-28 DIAGNOSIS — E7801 Familial hypercholesterolemia: Secondary | ICD-10-CM | POA: Diagnosis not present

## 2023-06-03 DIAGNOSIS — R809 Proteinuria, unspecified: Secondary | ICD-10-CM | POA: Diagnosis not present

## 2023-06-03 DIAGNOSIS — I252 Old myocardial infarction: Secondary | ICD-10-CM | POA: Diagnosis not present

## 2023-06-03 DIAGNOSIS — N400 Enlarged prostate without lower urinary tract symptoms: Secondary | ICD-10-CM | POA: Diagnosis not present

## 2023-06-03 DIAGNOSIS — D509 Iron deficiency anemia, unspecified: Secondary | ICD-10-CM | POA: Diagnosis not present

## 2023-06-03 DIAGNOSIS — I129 Hypertensive chronic kidney disease with stage 1 through stage 4 chronic kidney disease, or unspecified chronic kidney disease: Secondary | ICD-10-CM | POA: Diagnosis not present

## 2023-06-03 DIAGNOSIS — N1831 Chronic kidney disease, stage 3a: Secondary | ICD-10-CM | POA: Diagnosis not present

## 2023-06-03 DIAGNOSIS — E7801 Familial hypercholesterolemia: Secondary | ICD-10-CM | POA: Diagnosis not present

## 2023-06-03 DIAGNOSIS — R7303 Prediabetes: Secondary | ICD-10-CM | POA: Diagnosis not present

## 2023-06-03 DIAGNOSIS — I712 Thoracic aortic aneurysm, without rupture, unspecified: Secondary | ICD-10-CM | POA: Diagnosis not present

## 2023-06-11 ENCOUNTER — Encounter: Payer: Self-pay | Admitting: *Deleted

## 2023-08-18 DIAGNOSIS — H43393 Other vitreous opacities, bilateral: Secondary | ICD-10-CM | POA: Diagnosis not present

## 2023-09-22 ENCOUNTER — Ambulatory Visit (HOSPITAL_COMMUNITY)

## 2023-09-27 ENCOUNTER — Encounter (HOSPITAL_COMMUNITY)

## 2023-09-30 ENCOUNTER — Telehealth: Payer: Self-pay | Admitting: Internal Medicine

## 2023-09-30 NOTE — Telephone Encounter (Signed)
 Patient stated that in his testing notes that he didn't want to do his stress testing or the echo at the present time.

## 2023-10-07 DIAGNOSIS — R7303 Prediabetes: Secondary | ICD-10-CM | POA: Diagnosis not present

## 2023-10-07 DIAGNOSIS — M109 Gout, unspecified: Secondary | ICD-10-CM | POA: Diagnosis not present

## 2023-10-07 DIAGNOSIS — N1831 Chronic kidney disease, stage 3a: Secondary | ICD-10-CM | POA: Diagnosis not present

## 2023-10-07 DIAGNOSIS — E7801 Familial hypercholesterolemia: Secondary | ICD-10-CM | POA: Diagnosis not present

## 2023-10-13 DIAGNOSIS — R809 Proteinuria, unspecified: Secondary | ICD-10-CM | POA: Diagnosis not present

## 2023-10-13 DIAGNOSIS — R7303 Prediabetes: Secondary | ICD-10-CM | POA: Diagnosis not present

## 2023-10-13 DIAGNOSIS — N1831 Chronic kidney disease, stage 3a: Secondary | ICD-10-CM | POA: Diagnosis not present

## 2023-10-13 DIAGNOSIS — R748 Abnormal levels of other serum enzymes: Secondary | ICD-10-CM | POA: Diagnosis not present

## 2023-10-13 DIAGNOSIS — Z5329 Procedure and treatment not carried out because of patient's decision for other reasons: Secondary | ICD-10-CM | POA: Diagnosis not present

## 2023-10-13 DIAGNOSIS — D509 Iron deficiency anemia, unspecified: Secondary | ICD-10-CM | POA: Diagnosis not present

## 2023-10-13 DIAGNOSIS — E7801 Familial hypercholesterolemia: Secondary | ICD-10-CM | POA: Diagnosis not present

## 2023-10-13 DIAGNOSIS — I129 Hypertensive chronic kidney disease with stage 1 through stage 4 chronic kidney disease, or unspecified chronic kidney disease: Secondary | ICD-10-CM | POA: Diagnosis not present

## 2023-10-13 DIAGNOSIS — I10 Essential (primary) hypertension: Secondary | ICD-10-CM | POA: Diagnosis not present

## 2023-10-20 DIAGNOSIS — I10 Essential (primary) hypertension: Secondary | ICD-10-CM | POA: Diagnosis not present

## 2023-10-28 ENCOUNTER — Ambulatory Visit: Admitting: Internal Medicine

## 2023-12-15 ENCOUNTER — Ambulatory Visit (HOSPITAL_COMMUNITY)
Admission: RE | Admit: 2023-12-15 | Discharge: 2023-12-15 | Disposition: A | Discharge status: Home / Self Care | Source: Ambulatory Visit | Attending: Internal Medicine | Admitting: Internal Medicine

## 2023-12-15 DIAGNOSIS — I429 Cardiomyopathy, unspecified: Secondary | ICD-10-CM | POA: Insufficient documentation

## 2023-12-15 DIAGNOSIS — I428 Other cardiomyopathies: Secondary | ICD-10-CM | POA: Diagnosis not present

## 2023-12-15 LAB — ECHOCARDIOGRAM LIMITED
Calc EF: 33.6 %
Est EF: 30
S' Lateral: 5.4 cm
Single Plane A2C EF: 32 %
Single Plane A4C EF: 34.4 %

## 2023-12-15 MED ORDER — PERFLUTREN LIPID MICROSPHERE
1.0000 mL | INTRAVENOUS | Status: AC | PRN
  Administered 2023-12-15: 3 mL via INTRAVENOUS

## 2023-12-15 NOTE — Progress Notes (Signed)
*  PRELIMINARY RESULTS* Echocardiogram 2D Echocardiogram has been performed.  Phillip Pace 12/15/2023, 10:32 AM

## 2023-12-16 ENCOUNTER — Ambulatory Visit: Payer: Self-pay | Admitting: Internal Medicine

## 2023-12-16 DIAGNOSIS — Z79899 Other long term (current) drug therapy: Secondary | ICD-10-CM

## 2023-12-21 NOTE — Telephone Encounter (Signed)
 Spoke with patient regarding his results. He stated that he was allergic to a lot of blood pressure medications and would like to know exactly what kind of medications that will be prescribed so he can let the doctor know in [place]  that he seen before. He has had a lot of bad experiences with medications and doesn't want any setbacks. Patient reported he has been feeling better. He has been exercising and watching what he eats. He stated he will keep his appointment in Dec to discuss further treatment. Advised him will route to Dr. Mallipeddi for review

## 2023-12-21 NOTE — Telephone Encounter (Signed)
-----   Message from Vishnu P Mallipeddi sent at 12/16/2023 12:48 PM EST ----- LV function 30%, normal RV function, no valvular heart disease, moderate circumferential pericardial effusion and CVP 3 mmHg.  LV function further dropped from 45%.  Strongly emphasized in April 2025  to start GDMT but patient wanted to research and ask his family doctor in [place]  if it is okay to start this medications.  Wanted strongly stressed on starting GDMT again.  Let me know patient's  decision.  Keep appointment on 01/11/2024. ----- Message ----- From: Interface, [employer] Sent: 12/15/2023  11:40 AM EST To: Vishnu P Mallipeddi, MD

## 2023-12-24 MED ORDER — SPIRONOLACTONE 25 MG PO TABS: 12.5000 mg | ORAL_TABLET | Freq: Every day | ORAL | 3 refills | Status: DC

## 2023-12-24 MED ORDER — LOSARTAN POTASSIUM 25 MG PO TABS: 12.5000 mg | ORAL_TABLET | Freq: Every day | ORAL | 3 refills | Status: DC

## 2023-12-24 NOTE — Telephone Encounter (Signed)
-----   Message from Vishnu P Mallipeddi sent at 12/24/2023  4:03 PM EST ----- Start losartan  12.5 mg once daily Start Spironolactone  12.5 mg once daily Obtain BMP in 5 days ----- Message ----- From: Johnnye Littie HERO, CMA Sent: 12/22/2023   4:49 PM EST To: Diannah SQUIBB Mallipeddi, MD  Patient stated that he will try any of these except for Metoprolol  and whatever the other blood pressure medication that was listed in his medical records that he is allergic to that he ended up in  the hospital. Please advise ----- Message ----- From: Stacia Diannah SQUIBB, MD Sent: 12/22/2023   1:44 PM EST To: Littie HERO Johnnye, CMA  GDMT includes:  Beta blocker: metoprolol  or carvedilol ACEi or ARB or ARNI: Lisinopril  or Losartan  or Entresto MRA: Spironolactone  or Eplerenone SGLT2-I : Doreen or Jardiance ----- Message ----- From: Johnnye Littie HERO, CMA Sent: 12/21/2023  11:01 AM EST To: Vishnu P Mallipeddi, MD  ----- Message from Littie HERO Johnnye, CMA sent at 12/21/2023 11:01 AM EST -----   ----- Message ----- From: Stacia Diannah SQUIBB, MD Sent: 12/16/2023  12:48 PM EST To: Littie HERO Johnnye, CMA  LV function 30%, normal RV function, no valvular heart disease, moderate circumferential pericardial effusion and CVP 3 mmHg.  LV function further dropped from 45%.  Strongly emphasized in April 2025  to start GDMT but patient wanted to research and ask his family doctor in [place]  if it is okay to start this medications.  Wanted strongly stressed on starting GDMT again.  Let me know patient's  decision.  Keep appointment on 01/11/2024. ----- Message ----- From: Interface, [employer] Sent: 12/15/2023  11:40 AM EST To: Vishnu P Mallipeddi, MD

## 2023-12-24 NOTE — Telephone Encounter (Signed)
 Advised patient of recommendations from Dr. Mallipeddi. Advised him will send medications to [facility] on [address]. Also will have lab order mailed to him to have completed in a week.

## 2024-01-11 ENCOUNTER — Encounter: Payer: Self-pay | Admitting: Internal Medicine

## 2024-01-11 ENCOUNTER — Ambulatory Visit: Attending: Internal Medicine | Admitting: Internal Medicine

## 2024-01-11 ENCOUNTER — Telehealth: Payer: Self-pay | Admitting: Pharmacy Technician

## 2024-01-11 VITALS — BP 158/69 | HR 81 | Ht 75.0 in | Wt 211.0 lb

## 2024-01-11 DIAGNOSIS — I429 Cardiomyopathy, unspecified: Secondary | ICD-10-CM

## 2024-01-11 DIAGNOSIS — I3139 Other pericardial effusion (noninflammatory): Secondary | ICD-10-CM | POA: Diagnosis not present

## 2024-01-11 MED ORDER — BISOPROLOL FUMARATE 2.5 MG PO TABS: 1.0000 | ORAL_TABLET | Freq: Every day | ORAL | 3 refills | Status: AC

## 2024-01-11 MED ORDER — EPLERENONE 25 MG PO TABS: 25.0000 mg | ORAL_TABLET | Freq: Every day | ORAL | 3 refills | Status: AC

## 2024-01-11 NOTE — Telephone Encounter (Signed)
   Pharmacy Patient Advocate Encounter   Received notification from CoverMyMeds that prior authorization for eplerenone  is required/requested.   Insurance verification completed.   The patient is insured through [employer].   Per test claim: PA required; PA submitted to above mentioned insurance via Latent Key/confirmation #/EOC [facility] Status is pending

## 2024-01-11 NOTE — Patient Instructions (Signed)
 Medication Instructions:  Your physician has recommended you make the following change in your medication:   -Stop Losartan  -Stop Spironolactone  -Start Eplererione  -Start Bisoprolol  2.5 mg once daily   *If you need a refill on your cardiac medications before your next appointment, please call your pharmacy*  Lab Work: None If you have labs (blood work) drawn today and your tests are completely normal, you will receive your results only by: MyChart Message (if you have MyChart) OR A paper copy in the mail If you have any lab test that is abnormal or we need to change your treatment, we will call you to review the results.  Testing/Procedures: None  Follow-Up: At [facility], you and your health needs are our priority.  As part of our continuing mission to provide you with exceptional heart care, our providers are all part of one team.  This team includes your primary Cardiologist (physician) and Advanced Practice Providers or APPs (Physician Assistants and Nurse Practitioners) who all work together to provide you with the care you need, when you need it.  Your next appointment:   6 month(s)  Provider:   You may see Vishnu P Mallipeddi, MD or one of the following Advanced Practice Providers on your designated Care Team:   Brittany Strader, PA-C  Scotesia Silver[place], [place] Olivia Pavy, [place]     We recommend signing up for the patient portal called MyChart.  Sign up information is provided on this After Visit Summary.  MyChart is used to connect with patients for Virtual Visits (Telemedicine).  Patients are able to view lab/test results, encounter notes, upcoming appointments, etc.  Non-urgent messages can be sent to your provider as well.   To learn more about what you can do with MyChart, go to [email or web address].   Other Instructions

## 2024-01-11 NOTE — Progress Notes (Signed)
 Cardiology Office Note  Date: 01/11/2024   ID: Phillip Pace, DOB 1945-11-03, MRN [id number]  PCP:  Phillip Norleen PEDLAR, MD  Cardiologist:  [name], MD Electrophysiologist:  None   Reason for Office Visit: Follow-up visit of CAD, chronic systolic heart failure   History of Present Illness: Phillip Pace is a 78 y.o. male known to have CAD with multiple stents (13) at [place]  [facility] (Dr. Norleen Sick) and unknown arteries (no records available), HTN, COPD, NICM LVEF 45% (dropped to 30%) 25), Barrett's esophagus presented to cardiology clinic for follow-up visit.  His last documented stress test was in 2016 which showed a small defect in the basal inferoseptal, basal inferior, mid inferoseptal and mid inferior location that was fixed and consistent with prior MI with no ischemia and EF 50%.  Echocardiogram in 2022 showed LVEF 45 to 50%, RWMA, normal RV systolic function, aortic root 42 mm and no valvular disease.  Repeat echocardiogram in 2025 showed LVEF decreased to 30% and normal aortic diameter.  Patient was admitted in 03/2021 with hyponatremia, polyarthralgia, AKI and was discharged in stable condition.  He said he was sent home with hospice care in 2/23 to die at home. He then stopped taking all his blood pressure medications except allopurinol , pantoprazole  and tamsulosin . He said he felt a lot better after stopping metoprolol  and lisinopril .  He changed his diet and lifestyle after 2/23 hospitalization following which he noticed significant improvement in his energy levels and also his blood pressure was normalized.   Resumed GDMT for cardiomyopathy.  Lexiscan  was ordered but not completed.  He is here today for follow-up visit.  Patient did not tolerate spironolactone  and losartan .  He self discontinued.  He did not schedule Lexiscan  because he has a lot of medical bills to pay.  Does not have any symptoms of angina or DOE.  No fatigue.  No dizziness, palpitations,  syncope, leg swelling.   Past Medical History:  Diagnosis Date   Arthritis    BPH (benign prostatic hyperplasia)    CHF (congestive heart failure) (HCC)    Chronic combined systolic (congestive) and diastolic (congestive) heart failure (HCC)    last Echo with EF 45-50%, grade I diastolic dysfxn   COPD (chronic obstructive pulmonary disease) (HCC)    Coronary artery disease    hx of multiple MI's with 13 stents   Dilated aortic root    42 mm by 2D echo 12/2020   Gout    Hypertension     Past Surgical History:  Procedure Laterality Date   COLONOSCOPY N/A 05/25/2017   Procedure: COLONOSCOPY;  Surgeon: Shaaron Lamar HERO, MD;  [facility];  Service: Endoscopy;  Laterality: N/A;  12:00pm   ESOPHAGOGASTRODUODENOSCOPY N/A 05/25/2017   Procedure: ESOPHAGOGASTRODUODENOSCOPY (EGD);  Surgeon: Shaaron Lamar HERO, MD;  Location: [facility];  Service: Endoscopy;  Laterality: N/A;   FRACTURE SURGERY     collar bone   HERNIA REPAIR     x 2   MALONEY DILATION N/A 05/25/2017   Procedure: MALONEY DILATION;  Surgeon: Shaaron Lamar HERO, MD;  Location: [facility];  Service: Endoscopy;  Laterality: N/A;   stents 13      Current Outpatient Medications  Medication Sig Dispense Refill   acetaminophen  (TYLENOL ) 325 MG tablet Take 2 tablets (650 mg total) by mouth every 6 (six) hours as needed for mild pain (or Fever >/= 101).     allopurinol  (ZYLOPRIM ) 100 MG tablet Take 100 mg by mouth  daily.     ascorbic acid (VITAMIN C) 100 MG tablet Take by mouth.     aspirin  EC 81 MG tablet Take 81 mg by mouth daily. Swallow whole.     b complex vitamins capsule Take 1 capsule by mouth daily.     Cholecalciferol  (VITAMIN D3) 5000 units CAPS Take 5,000 Units by mouth daily.     HYDROcodone -acetaminophen  (NORCO/VICODIN) 5-325 MG tablet      losartan  (COZAAR ) 25 MG tablet Take 0.5 tablets (12.5 mg total) by mouth daily. 45 tablet 3   NITROSTAT  0.4 MG SL tablet Place 0.4 mg under the tongue every 5 (five)  minutes as needed.  5   Omega-3 Fatty Acids (FISH OIL) 1000 MG CAPS Take 2 capsules by mouth 2 (two) times daily.     ondansetron  (ZOFRAN -ODT) 8 MG disintegrating tablet Take 8 mg by mouth 2 (two) times daily as needed for nausea or vomiting.     pantoprazole  (PROTONIX ) 40 MG tablet Take 1 tablet (40 mg total) by mouth 2 (two) times daily. TAKE 1 TABLET(40 MG) BY MOUTH TWICE DAILY BEFORE A MEAL Strength: 40 mg     spironolactone  (ALDACTONE ) 25 MG tablet Take 0.5 tablets (12.5 mg total) by mouth daily. 45 tablet 3   traMADol  (ULTRAM ) 50 MG tablet Take 50 mg by mouth 2 (two) times daily.     No current facility-administered medications for this visit.   Allergies:  Metoprolol , Penicillins, Shellfish protein-containing drug products, Penicillin g, and Shellfish allergy   Social History: The patient  reports that he quit smoking about 30 years ago. His smoking use included cigarettes. He has never used smokeless tobacco. He reports that he does not drink alcohol and does not use drugs.   Family History: The patient's family history includes CAD in his mother; Cancer - Colon in his mother; Colon cancer in his father.   ROS:  Please see the history of present illness. Otherwise, complete review of systems is positive for none.  All other systems are reviewed and negative.   Physical Exam: VS:  There were no vitals taken for this visit., BMI There is no height or weight on file to calculate BMI.  Wt Readings from Last 3 Encounters:  05/13/23 203 lb 9.6 oz (92.4 kg)  03/29/23 207 lb (93.9 kg)  08/07/22 200 lb (90.7 kg)    General: Patient appears comfortable at rest. HEENT: Conjunctiva and lids normal, oropharynx clear with moist mucosa. Neck: Supple, no elevated JVP or carotid bruits, no thyromegaly. Lungs: Clear to auscultation, nonlabored breathing at rest. Cardiac: Regular rate and rhythm, no S3 or significant systolic murmur, no pericardial rub. Abdomen: Soft, nontender, no hepatomegaly,  bowel sounds present, no guarding or rebound. Extremities: No pitting edema, distal pulses 2+. Skin: Warm and dry. Musculoskeletal: No kyphosis. Neuropsychiatric: Alert and oriented x3, affect grossly appropriate.  ECG:  An ECG dated 01/02/2022 was personally reviewed today and demonstrated:  Uninterpretable rhythm  Recent Labwork: No results found for requested labs within last 365 days.     Component Value Date/Time   CHOL 161 11/12/2020 1103   TRIG 347 (H) 11/12/2020 1103   HDL 41 11/12/2020 1103   CHOLHDL 3.9 11/12/2020 1103   VLDL 69 (H) 11/12/2020 1103   LDLCALC 51 11/12/2020 1103    Other Studies Reviewed Today: Echo from 12/2020 LVEF 45 to 50% Grade 1 diastolic dysfunction LV systolic function is normal Small pericardial effusion Aortic root dilatation 42 mm  Assessment and Plan:  # Cardiomyopathy,  unclear etiology # Chronic systolic heart failure - LVEF was 45 to 50% previously in 2017 that dropped to 30% in November 2025.  No angina or DOE but has fatigue.  Lexiscan  was ordered in the last clinic visit but this was not completed as he stated that he had a lot of medical bills to pay.  He is currently on losartan  12.5 mg once daily and spironolactone  12.5 mg once daily which he self discontinued due to side effects.  He is agreeable to trying other medications, will start eplerenone  25 mg once daily and bisoprolol  2.5 mg once daily.  If he experiences any side effects, instructed him to discontinue. - Patient reported that he had multiple stents in [place]  and he was placed on aspirin  325 mg once daily after stent but not on Plavix/Brilinta.  Moreover, he reported that he stopped taking aspirin  after 1 month.  Due to questionable compliance, I do not think he will benefit from [facility]  Can consider CT cardiac at some point to define coronary anatomy.  # CAD status post multiple PCI in [place]  with LVEF 30% # HLD, unknown values - Continue aspirin  81 mg once daily - Intolerant  to statins.  Refused injectables.  # Moderate pericardial effusion - By echo in November 2025.  CVP 3 mmHg.  Asymptomatic.  Repeat echocardiogram in 6 months.  # Aortic root dilatation 42 mm - Echocardiogram did not reveal any aortic dilatation.  # HTN, controlled - He previously stopped taking all medications.  Resume GDMT, as above.   30 minutes spent in reviewing prior medical records, more than 3 labs, discussion and documentation.  Medication Adjustments/Labs and Tests Ordered: Current medicines are reviewed at length with the patient today.  Concerns regarding medicines are outlined above.   Tests Ordered: Orders Placed This Encounter  Procedures   EKG 12-Lead    Medication Changes: No orders of the defined types were placed in this encounter.   Disposition:  Follow up 3 months  Signed, Marce Charlesworth Arleta Maywood, MD, 01/11/2024 9:24 AM    [facility] [address], [place], [place] [address]

## 2024-01-12 NOTE — Telephone Encounter (Signed)
 Pharmacy Patient Advocate Encounter  Received notification from [employer] that Prior Authorization for eplerenone  has been APPROVED from 01/12/24 to 02/01/25   PA #/Case ID/Reference #: [id number]

## 2024-02-28 ENCOUNTER — Encounter: Payer: Self-pay | Admitting: *Deleted
